# Patient Record
Sex: Female | Born: 1935 | Race: Black or African American | Hispanic: No | Smoking: Never smoker
Health system: Southern US, Community
[De-identification: ages and names within clinical notes are randomized; demographics above are authoritative.]

## PROBLEM LIST (undated history)

## (undated) DIAGNOSIS — R0989 Other specified symptoms and signs involving the circulatory and respiratory systems: Secondary | ICD-10-CM

## (undated) DIAGNOSIS — D649 Anemia, unspecified: Secondary | ICD-10-CM

## (undated) DIAGNOSIS — E8881 Metabolic syndrome: Secondary | ICD-10-CM

## (undated) DIAGNOSIS — I10 Essential (primary) hypertension: Secondary | ICD-10-CM

## (undated) DIAGNOSIS — T7840XA Allergy, unspecified, initial encounter: Secondary | ICD-10-CM

## (undated) DIAGNOSIS — E785 Hyperlipidemia, unspecified: Secondary | ICD-10-CM

## (undated) HISTORY — DX: Hyperlipidemia, unspecified: E78.5

## (undated) HISTORY — DX: Allergy, unspecified, initial encounter: T78.40XA

## (undated) HISTORY — DX: Metabolic syndrome: E88.81

## (undated) HISTORY — PX: TOTAL HIP ARTHROPLASTY: SHX124

## (undated) HISTORY — DX: Metabolic syndrome: E88.810

## (undated) HISTORY — DX: Essential (primary) hypertension: I10

## (undated) HISTORY — DX: Anemia, unspecified: D64.9

## (undated) HISTORY — DX: Other specified symptoms and signs involving the circulatory and respiratory systems: R09.89

## (undated) HISTORY — PX: CATARACT EXTRACTION, BILATERAL: SHX1313

---

## 1981-07-15 HISTORY — PX: TOTAL ABDOMINAL HYSTERECTOMY W/ BILATERAL SALPINGOOPHORECTOMY: SHX83

## 1999-01-05 ENCOUNTER — Encounter: Payer: Self-pay | Admitting: Internal Medicine

## 1999-01-05 ENCOUNTER — Ambulatory Visit (HOSPITAL_COMMUNITY): Admission: RE | Admit: 1999-01-05 | Discharge: 1999-01-05 | Payer: Self-pay | Admitting: Internal Medicine

## 2000-01-30 ENCOUNTER — Encounter: Payer: Self-pay | Admitting: Internal Medicine

## 2000-01-30 ENCOUNTER — Ambulatory Visit (HOSPITAL_COMMUNITY): Admission: RE | Admit: 2000-01-30 | Discharge: 2000-01-30 | Payer: Self-pay | Admitting: Internal Medicine

## 2000-12-29 ENCOUNTER — Ambulatory Visit (HOSPITAL_COMMUNITY): Admission: RE | Admit: 2000-12-29 | Discharge: 2000-12-29 | Payer: Self-pay | Admitting: Internal Medicine

## 2001-03-18 ENCOUNTER — Encounter: Payer: Self-pay | Admitting: Internal Medicine

## 2001-03-18 ENCOUNTER — Ambulatory Visit (HOSPITAL_COMMUNITY): Admission: RE | Admit: 2001-03-18 | Discharge: 2001-03-18 | Payer: Self-pay | Admitting: Internal Medicine

## 2001-11-13 ENCOUNTER — Encounter: Payer: Self-pay | Admitting: Internal Medicine

## 2001-11-13 ENCOUNTER — Inpatient Hospital Stay (HOSPITAL_COMMUNITY): Admission: EM | Admit: 2001-11-13 | Discharge: 2001-11-15 | Payer: Self-pay | Admitting: Emergency Medicine

## 2001-11-13 ENCOUNTER — Encounter: Payer: Self-pay | Admitting: Emergency Medicine

## 2002-06-07 ENCOUNTER — Encounter: Payer: Self-pay | Admitting: Internal Medicine

## 2002-06-07 ENCOUNTER — Ambulatory Visit (HOSPITAL_COMMUNITY): Admission: RE | Admit: 2002-06-07 | Discharge: 2002-06-07 | Payer: Self-pay | Admitting: Internal Medicine

## 2003-03-14 ENCOUNTER — Emergency Department (HOSPITAL_COMMUNITY): Admission: EM | Admit: 2003-03-14 | Discharge: 2003-03-15 | Payer: Self-pay | Admitting: Emergency Medicine

## 2003-03-14 ENCOUNTER — Encounter: Payer: Self-pay | Admitting: Emergency Medicine

## 2003-10-19 ENCOUNTER — Ambulatory Visit (HOSPITAL_COMMUNITY): Admission: RE | Admit: 2003-10-19 | Discharge: 2003-10-19 | Payer: Self-pay | Admitting: Internal Medicine

## 2004-05-24 ENCOUNTER — Ambulatory Visit: Payer: Self-pay | Admitting: Internal Medicine

## 2004-06-04 ENCOUNTER — Ambulatory Visit: Payer: Self-pay | Admitting: Internal Medicine

## 2004-07-15 HISTORY — PX: CARDIAC CATHETERIZATION: SHX172

## 2004-08-01 ENCOUNTER — Ambulatory Visit: Payer: Self-pay | Admitting: Internal Medicine

## 2004-08-13 ENCOUNTER — Encounter: Admission: RE | Admit: 2004-08-13 | Discharge: 2004-08-13 | Payer: Self-pay | Admitting: Internal Medicine

## 2004-10-15 ENCOUNTER — Ambulatory Visit: Payer: Self-pay | Admitting: Internal Medicine

## 2004-11-16 ENCOUNTER — Ambulatory Visit: Payer: Self-pay | Admitting: Internal Medicine

## 2005-02-04 ENCOUNTER — Ambulatory Visit: Payer: Self-pay | Admitting: Internal Medicine

## 2005-02-07 ENCOUNTER — Ambulatory Visit (HOSPITAL_COMMUNITY): Admission: RE | Admit: 2005-02-07 | Discharge: 2005-02-07 | Payer: Self-pay | Admitting: Internal Medicine

## 2005-02-13 ENCOUNTER — Ambulatory Visit: Payer: Self-pay | Admitting: Internal Medicine

## 2005-03-20 ENCOUNTER — Ambulatory Visit: Payer: Self-pay | Admitting: Internal Medicine

## 2005-04-25 ENCOUNTER — Inpatient Hospital Stay (HOSPITAL_COMMUNITY): Admission: RE | Admit: 2005-04-25 | Discharge: 2005-05-03 | Payer: Self-pay | Admitting: Orthopedic Surgery

## 2005-04-25 ENCOUNTER — Ambulatory Visit: Payer: Self-pay | Admitting: Physical Medicine & Rehabilitation

## 2005-04-27 ENCOUNTER — Ambulatory Visit: Payer: Self-pay | Admitting: Internal Medicine

## 2005-05-01 ENCOUNTER — Ambulatory Visit: Payer: Self-pay | Admitting: Gastroenterology

## 2005-05-02 ENCOUNTER — Encounter: Payer: Self-pay | Admitting: Cardiology

## 2005-05-03 ENCOUNTER — Inpatient Hospital Stay (HOSPITAL_COMMUNITY)
Admission: RE | Admit: 2005-05-03 | Discharge: 2005-05-10 | Payer: Self-pay | Admitting: Physical Medicine & Rehabilitation

## 2005-05-14 ENCOUNTER — Ambulatory Visit: Payer: Self-pay | Admitting: Internal Medicine

## 2005-05-27 ENCOUNTER — Ambulatory Visit: Payer: Self-pay | Admitting: Internal Medicine

## 2005-09-16 ENCOUNTER — Ambulatory Visit: Payer: Self-pay | Admitting: Internal Medicine

## 2005-09-17 ENCOUNTER — Ambulatory Visit: Payer: Self-pay

## 2005-09-18 ENCOUNTER — Ambulatory Visit: Payer: Self-pay | Admitting: Internal Medicine

## 2005-09-18 ENCOUNTER — Ambulatory Visit: Payer: Self-pay | Admitting: *Deleted

## 2005-09-18 ENCOUNTER — Inpatient Hospital Stay (HOSPITAL_COMMUNITY): Admission: RE | Admit: 2005-09-18 | Discharge: 2005-09-24 | Payer: Self-pay | Admitting: Orthopedic Surgery

## 2005-09-18 ENCOUNTER — Ambulatory Visit: Payer: Self-pay | Admitting: Physical Medicine & Rehabilitation

## 2005-09-20 ENCOUNTER — Encounter: Payer: Self-pay | Admitting: Cardiology

## 2005-09-24 ENCOUNTER — Inpatient Hospital Stay
Admission: RE | Admit: 2005-09-24 | Discharge: 2005-10-03 | Payer: Self-pay | Admitting: Physical Medicine & Rehabilitation

## 2006-03-03 ENCOUNTER — Ambulatory Visit (HOSPITAL_COMMUNITY): Admission: RE | Admit: 2006-03-03 | Discharge: 2006-03-03 | Payer: Self-pay | Admitting: Internal Medicine

## 2006-03-04 ENCOUNTER — Ambulatory Visit: Payer: Self-pay | Admitting: Internal Medicine

## 2006-04-03 ENCOUNTER — Ambulatory Visit: Payer: Self-pay | Admitting: Internal Medicine

## 2006-04-29 ENCOUNTER — Ambulatory Visit: Payer: Self-pay | Admitting: Internal Medicine

## 2006-05-02 ENCOUNTER — Ambulatory Visit: Payer: Self-pay | Admitting: Cardiology

## 2006-05-02 ENCOUNTER — Ambulatory Visit: Payer: Self-pay | Admitting: Gastroenterology

## 2006-06-02 ENCOUNTER — Ambulatory Visit: Payer: Self-pay | Admitting: Gastroenterology

## 2006-06-02 HISTORY — PX: COLONOSCOPY: SHX174

## 2006-06-27 ENCOUNTER — Ambulatory Visit: Payer: Self-pay | Admitting: Internal Medicine

## 2006-06-27 LAB — CONVERTED CEMR LAB
ALT: 13 units/L (ref 0–40)
AST: 16 units/L (ref 0–37)
Chol/HDL Ratio, serum: 3.6
Cholesterol: 127 mg/dL (ref 0–200)
HDL: 34.9 mg/dL — ABNORMAL LOW (ref >39.0)
LDL Cholesterol: 83 mg/dL (ref 0–99)
Total CK: 76 units/L (ref 7–177)
Triglyceride fasting, serum: 47 mg/dL (ref 0–149)
VLDL: 9 mg/dL (ref 0–40)

## 2007-02-23 ENCOUNTER — Telehealth (INDEPENDENT_AMBULATORY_CARE_PROVIDER_SITE_OTHER): Payer: Self-pay | Admitting: *Deleted

## 2007-03-03 ENCOUNTER — Encounter (INDEPENDENT_AMBULATORY_CARE_PROVIDER_SITE_OTHER): Payer: Self-pay | Admitting: *Deleted

## 2007-03-03 ENCOUNTER — Ambulatory Visit: Payer: Self-pay | Admitting: Internal Medicine

## 2007-03-03 DIAGNOSIS — IMO0001 Reserved for inherently not codable concepts without codable children: Secondary | ICD-10-CM

## 2007-03-03 DIAGNOSIS — E8881 Metabolic syndrome: Secondary | ICD-10-CM

## 2007-03-03 LAB — CONVERTED CEMR LAB
Bilirubin Urine: NEGATIVE
Blood in Urine, dipstick: NEGATIVE
Cholesterol, target level: 200 mg/dL
Glucose, Urine, Semiquant: NEGATIVE
HDL goal, serum: 40 mg/dL
Ketones, urine, test strip: NEGATIVE
LDL Goal: 130 mg/dL
Nitrite: NEGATIVE
Protein, U semiquant: NEGATIVE
Specific Gravity, Urine: 1.01
Urobilinogen, UA: NEGATIVE
WBC Urine, dipstick: NEGATIVE
pH: 6.5

## 2007-03-05 ENCOUNTER — Ambulatory Visit (HOSPITAL_COMMUNITY): Admission: RE | Admit: 2007-03-05 | Discharge: 2007-03-05 | Payer: Self-pay | Admitting: Internal Medicine

## 2007-03-08 LAB — CONVERTED CEMR LAB
ALT: 14 units/L (ref 0–35)
AST: 15 units/L (ref 0–37)
BUN: 17 mg/dL (ref 6–23)
Cholesterol: 154 mg/dL (ref 0–200)
Creatinine, Ser: 1.1 mg/dL (ref 0.4–1.2)
HDL: 30.7 mg/dL — ABNORMAL LOW (ref >39.0)
Hgb A1c MFr Bld: 5.2 % (ref 4.6–6.0)
LDL Cholesterol: 111 mg/dL — ABNORMAL HIGH (ref 0–99)
Potassium: 4.2 meq/L (ref 3.5–5.1)
TSH: 2.15 microintl units/mL (ref 0.35–5.50)
Total CHOL/HDL Ratio: 5
Total CK: 89 units/L (ref 7–177)
Triglycerides: 61 mg/dL (ref 0–149)
VLDL: 12 mg/dL (ref 0–40)

## 2007-03-09 ENCOUNTER — Encounter (INDEPENDENT_AMBULATORY_CARE_PROVIDER_SITE_OTHER): Payer: Self-pay | Admitting: *Deleted

## 2007-05-06 ENCOUNTER — Ambulatory Visit: Payer: Self-pay | Admitting: Internal Medicine

## 2007-05-27 ENCOUNTER — Ambulatory Visit: Payer: Self-pay | Admitting: Internal Medicine

## 2007-05-27 DIAGNOSIS — J309 Allergic rhinitis, unspecified: Secondary | ICD-10-CM | POA: Insufficient documentation

## 2007-05-27 DIAGNOSIS — D649 Anemia, unspecified: Secondary | ICD-10-CM

## 2007-05-27 DIAGNOSIS — R0989 Other specified symptoms and signs involving the circulatory and respiratory systems: Secondary | ICD-10-CM

## 2007-05-27 DIAGNOSIS — J45909 Unspecified asthma, uncomplicated: Secondary | ICD-10-CM

## 2007-05-29 ENCOUNTER — Ambulatory Visit: Payer: Self-pay | Admitting: Internal Medicine

## 2007-05-29 DIAGNOSIS — M722 Plantar fascial fibromatosis: Secondary | ICD-10-CM

## 2007-05-29 DIAGNOSIS — R252 Cramp and spasm: Secondary | ICD-10-CM

## 2007-05-31 LAB — CONVERTED CEMR LAB
BUN: 19 mg/dL (ref 6–23)
Creatinine, Ser: 1.05 mg/dL (ref 0.40–1.20)
Potassium: 4.2 meq/L (ref 3.5–5.3)

## 2007-06-01 ENCOUNTER — Encounter (INDEPENDENT_AMBULATORY_CARE_PROVIDER_SITE_OTHER): Payer: Self-pay | Admitting: *Deleted

## 2007-06-23 ENCOUNTER — Ambulatory Visit: Payer: Self-pay | Admitting: Internal Medicine

## 2007-06-23 DIAGNOSIS — E785 Hyperlipidemia, unspecified: Secondary | ICD-10-CM | POA: Insufficient documentation

## 2007-06-23 DIAGNOSIS — I10 Essential (primary) hypertension: Secondary | ICD-10-CM

## 2007-06-23 DIAGNOSIS — K1370 Unspecified lesions of oral mucosa: Secondary | ICD-10-CM | POA: Insufficient documentation

## 2007-09-08 ENCOUNTER — Encounter: Payer: Self-pay | Admitting: Internal Medicine

## 2007-09-09 ENCOUNTER — Ambulatory Visit: Payer: Self-pay | Admitting: Internal Medicine

## 2007-09-12 LAB — CONVERTED CEMR LAB
BUN: 17 mg/dL (ref 6–23)
Creatinine, Ser: 1.2 mg/dL (ref 0.4–1.2)
Potassium: 4.3 meq/L (ref 3.5–5.1)
Total CK: 71 units/L (ref 7–177)

## 2007-09-15 ENCOUNTER — Encounter (INDEPENDENT_AMBULATORY_CARE_PROVIDER_SITE_OTHER): Payer: Self-pay | Admitting: *Deleted

## 2007-11-20 ENCOUNTER — Ambulatory Visit: Payer: Self-pay | Admitting: Internal Medicine

## 2007-11-20 ENCOUNTER — Encounter: Payer: Self-pay | Admitting: Internal Medicine

## 2007-11-20 DIAGNOSIS — M545 Low back pain: Secondary | ICD-10-CM

## 2007-11-20 LAB — CONVERTED CEMR LAB
Bilirubin Urine: NEGATIVE
Blood in Urine, dipstick: NEGATIVE
Glucose, Urine, Semiquant: NEGATIVE
Ketones, urine, test strip: NEGATIVE
Nitrite: NEGATIVE
Protein, U semiquant: NEGATIVE
Specific Gravity, Urine: 1.015
Urobilinogen, UA: NEGATIVE
WBC Urine, dipstick: NEGATIVE
pH: 6

## 2007-11-23 ENCOUNTER — Encounter (INDEPENDENT_AMBULATORY_CARE_PROVIDER_SITE_OTHER): Payer: Self-pay | Admitting: *Deleted

## 2007-12-02 ENCOUNTER — Encounter: Admission: RE | Admit: 2007-12-02 | Discharge: 2007-12-23 | Payer: Self-pay | Admitting: Internal Medicine

## 2007-12-02 ENCOUNTER — Encounter: Payer: Self-pay | Admitting: Internal Medicine

## 2007-12-15 ENCOUNTER — Ambulatory Visit: Payer: Self-pay | Admitting: Internal Medicine

## 2008-01-12 ENCOUNTER — Telehealth (INDEPENDENT_AMBULATORY_CARE_PROVIDER_SITE_OTHER): Payer: Self-pay | Admitting: *Deleted

## 2008-03-07 ENCOUNTER — Encounter: Payer: Self-pay | Admitting: Internal Medicine

## 2008-03-11 ENCOUNTER — Telehealth (INDEPENDENT_AMBULATORY_CARE_PROVIDER_SITE_OTHER): Payer: Self-pay | Admitting: *Deleted

## 2008-04-08 ENCOUNTER — Telehealth (INDEPENDENT_AMBULATORY_CARE_PROVIDER_SITE_OTHER): Payer: Self-pay | Admitting: *Deleted

## 2008-04-08 ENCOUNTER — Ambulatory Visit: Payer: Self-pay | Admitting: Internal Medicine

## 2008-04-08 LAB — CONVERTED CEMR LAB
BUN: 21 mg/dL (ref 6–23)
Creatinine, Ser: 1.2 mg/dL (ref 0.4–1.2)
Potassium: 4.2 meq/L (ref 3.5–5.1)

## 2008-04-11 ENCOUNTER — Ambulatory Visit: Payer: Self-pay | Admitting: Internal Medicine

## 2008-04-11 DIAGNOSIS — I498 Other specified cardiac arrhythmias: Secondary | ICD-10-CM

## 2008-04-14 ENCOUNTER — Ambulatory Visit (HOSPITAL_COMMUNITY): Admission: RE | Admit: 2008-04-14 | Discharge: 2008-04-14 | Payer: Self-pay | Admitting: Internal Medicine

## 2008-09-30 ENCOUNTER — Encounter: Payer: Self-pay | Admitting: Internal Medicine

## 2008-09-30 ENCOUNTER — Ambulatory Visit: Payer: Self-pay | Admitting: Internal Medicine

## 2008-10-03 ENCOUNTER — Encounter (INDEPENDENT_AMBULATORY_CARE_PROVIDER_SITE_OTHER): Payer: Self-pay | Admitting: *Deleted

## 2008-10-03 LAB — CONVERTED CEMR LAB
BUN: 21 mg/dL (ref 6–23)
Creatinine, Ser: 1.1 mg/dL (ref 0.4–1.2)
Potassium: 4.3 meq/L (ref 3.5–5.1)

## 2008-10-13 ENCOUNTER — Encounter: Payer: Self-pay | Admitting: Internal Medicine

## 2008-10-13 ENCOUNTER — Telehealth (INDEPENDENT_AMBULATORY_CARE_PROVIDER_SITE_OTHER): Payer: Self-pay | Admitting: *Deleted

## 2008-11-10 ENCOUNTER — Telehealth: Payer: Self-pay | Admitting: Internal Medicine

## 2008-11-21 ENCOUNTER — Ambulatory Visit (HOSPITAL_BASED_OUTPATIENT_CLINIC_OR_DEPARTMENT_OTHER): Admission: RE | Admit: 2008-11-21 | Discharge: 2008-11-22 | Payer: Self-pay | Admitting: Orthopedic Surgery

## 2008-11-21 HISTORY — PX: SHOULDER SURGERY: SHX246

## 2009-01-02 ENCOUNTER — Ambulatory Visit: Payer: Self-pay | Admitting: Internal Medicine

## 2009-01-02 DIAGNOSIS — R5383 Other fatigue: Secondary | ICD-10-CM

## 2009-01-02 DIAGNOSIS — R5381 Other malaise: Secondary | ICD-10-CM

## 2009-01-05 LAB — CONVERTED CEMR LAB
BUN: 20 mg/dL (ref 6–23)
Basophils Absolute: 0 10*3/uL (ref 0.0–0.1)
Basophils Relative: 0.1 % (ref 0.0–3.0)
Creatinine, Ser: 1.1 mg/dL (ref 0.4–1.2)
Eosinophils Absolute: 0.2 10*3/uL (ref 0.0–0.7)
Eosinophils Relative: 2.1 % (ref 0.0–5.0)
HCT: 31.8 % — ABNORMAL LOW (ref 36.0–46.0)
Hemoglobin: 10.9 g/dL — ABNORMAL LOW (ref 12.0–15.0)
Hgb A1c MFr Bld: 5.4 % (ref 4.6–6.5)
Lymphocytes Relative: 16.6 % (ref 12.0–46.0)
Lymphs Abs: 1.4 10*3/uL (ref 0.7–4.0)
MCHC: 34.1 g/dL (ref 30.0–36.0)
MCV: 95.9 fL (ref 78.0–100.0)
Monocytes Absolute: 0.6 10*3/uL (ref 0.1–1.0)
Monocytes Relative: 6.9 % (ref 3.0–12.0)
Neutro Abs: 6.2 10*3/uL (ref 1.4–7.7)
Neutrophils Relative %: 74.3 % (ref 43.0–77.0)
Platelets: 279 10*3/uL (ref 150.0–400.0)
Potassium: 4.2 meq/L (ref 3.5–5.1)
RBC: 3.32 M/uL — ABNORMAL LOW (ref 3.87–5.11)
RDW: 11.1 % — ABNORMAL LOW (ref 11.5–14.6)
TSH: 0.89 microintl units/mL (ref 0.35–5.50)
WBC: 8.4 10*3/uL (ref 4.5–10.5)

## 2009-01-06 ENCOUNTER — Encounter (INDEPENDENT_AMBULATORY_CARE_PROVIDER_SITE_OTHER): Payer: Self-pay | Admitting: *Deleted

## 2009-06-19 ENCOUNTER — Encounter (INDEPENDENT_AMBULATORY_CARE_PROVIDER_SITE_OTHER): Payer: Self-pay | Admitting: *Deleted

## 2009-08-23 ENCOUNTER — Ambulatory Visit (HOSPITAL_COMMUNITY): Admission: RE | Admit: 2009-08-23 | Discharge: 2009-08-23 | Payer: Self-pay | Admitting: Internal Medicine

## 2009-08-23 LAB — HM MAMMOGRAPHY: HM Mammogram: NEGATIVE

## 2009-08-25 ENCOUNTER — Ambulatory Visit: Payer: Self-pay | Admitting: Internal Medicine

## 2009-08-25 DIAGNOSIS — R9431 Abnormal electrocardiogram [ECG] [EKG]: Secondary | ICD-10-CM | POA: Insufficient documentation

## 2009-08-28 LAB — CONVERTED CEMR LAB
ALT: 16 units/L (ref 0–35)
AST: 16 units/L (ref 0–37)
Albumin: 3.8 g/dL (ref 3.5–5.2)
Alkaline Phosphatase: 99 units/L (ref 39–117)
BUN: 13 mg/dL (ref 6–23)
Basophils Absolute: 0 10*3/uL (ref 0.0–0.1)
Basophils Relative: 0.2 % (ref 0.0–3.0)
Bilirubin, Direct: 0 mg/dL (ref 0.0–0.3)
CO2: 33 meq/L — ABNORMAL HIGH (ref 19–32)
Calcium: 9.5 mg/dL (ref 8.4–10.5)
Chloride: 105 meq/L (ref 96–112)
Cholesterol: 147 mg/dL (ref 0–200)
Creatinine, Ser: 1 mg/dL (ref 0.4–1.2)
Eosinophils Absolute: 0.2 10*3/uL (ref 0.0–0.7)
Eosinophils Relative: 2.2 % (ref 0.0–5.0)
GFR calc non Af Amer: 69.88 mL/min (ref >60)
Glucose, Bld: 112 mg/dL — ABNORMAL HIGH (ref 70–99)
HCT: 33.7 % — ABNORMAL LOW (ref 36.0–46.0)
HDL: 34.5 mg/dL — ABNORMAL LOW (ref >39.00)
Hemoglobin: 10.9 g/dL — ABNORMAL LOW (ref 12.0–15.0)
Hgb A1c MFr Bld: 5.7 % (ref 4.6–6.5)
LDL Cholesterol: 94 mg/dL (ref 0–99)
Lymphocytes Relative: 18.2 % (ref 12.0–46.0)
Lymphs Abs: 1.4 10*3/uL (ref 0.7–4.0)
MCHC: 32.3 g/dL (ref 30.0–36.0)
MCV: 99.4 fL (ref 78.0–100.0)
Monocytes Absolute: 0.6 10*3/uL (ref 0.1–1.0)
Monocytes Relative: 7.4 % (ref 3.0–12.0)
Neutro Abs: 5.5 10*3/uL (ref 1.4–7.7)
Neutrophils Relative %: 72 % (ref 43.0–77.0)
Platelets: 328 10*3/uL (ref 150.0–400.0)
Potassium: 4.4 meq/L (ref 3.5–5.1)
RBC: 3.39 M/uL — ABNORMAL LOW (ref 3.87–5.11)
RDW: 11.8 % (ref 11.5–14.6)
Sodium: 141 meq/L (ref 135–145)
TSH: 1.06 microintl units/mL (ref 0.35–5.50)
Total Bilirubin: 0.8 mg/dL (ref 0.3–1.2)
Total CHOL/HDL Ratio: 4
Total Protein: 7.6 g/dL (ref 6.0–8.3)
Triglycerides: 94 mg/dL (ref 0.0–149.0)
VLDL: 18.8 mg/dL (ref 0.0–40.0)
WBC: 7.7 10*3/uL (ref 4.5–10.5)

## 2009-09-14 ENCOUNTER — Ambulatory Visit: Payer: Self-pay | Admitting: Internal Medicine

## 2009-09-14 LAB — CONVERTED CEMR LAB
OCCULT 1: NEGATIVE
OCCULT 2: NEGATIVE
OCCULT 3: NEGATIVE

## 2010-08-04 ENCOUNTER — Encounter: Payer: Self-pay | Admitting: Internal Medicine

## 2010-08-14 NOTE — Assessment & Plan Note (Signed)
Summary: cpx/ns/kdc   Vital Signs:  Patient profile:   76 year old female Height:      65.75 inches Weight:      249.2 pounds BMI:     40.67 Temp:     98.1 degrees F oral Pulse rate:   60 / minute Resp:     16 per minute BP sitting:   118 / 86  (left arm) Cuff size:   large  Vitals Entered By: Shonna Chock (August 25, 2009 11:18 AM)  Comments REVIEWED MED LIST, PATIENT AGREED DOSE AND INSTRUCTION CORRECT    History of Present Illness: [name] is here for [facility] / Medicare physical; she  is having some back pain since 07/04/2009 ? after lifting heavy bags. Intermittent pulling sensation  with spinal  rotation. Rx: Tylenol with benefit  Preventive Screening-Counseling & Management  Caffeine-Diet-Exercise     Does Patient Exercise: yes  Allergies: 1)  ! * Guiatuss Ac 2)  Doxycycline 3)  Darvocet 4)  * Labetolol  Past History:  Past Medical History: Allergic rhinitis Asthma May 2003-bronchitis/ CHF Anemia-NOS Hyperlipidemia Hypertension PLANTAR FASCITIS  CAROTID BRUIT LEFT METABOLIC SYNDROME  Past Surgical History: Catheterization  negative  2006 Total hip replacement 2005; 2006; Shoulder surgery 11/21/2008 ,Dr Simonne Come Hysterectomy & BSO for infection & ovarian disease, Dr Randell Patient  Family History: Father: HTN, CVA Mother: HTN, arthritis Siblings: 2 sisters with HTN PGM: DM MGF: MI @ ? age MGM: stomach CA; no FH thyroid disease  Social History: Retired Never Smoked Alcohol use-no Regular exercise-yes:walks 30 min once daily  Does Patient Exercise:  yes  Review of Systems       The patient complains of dyspnea on exertion.  The patient denies anorexia, fever, weight loss, weight gain, decreased hearing, hoarseness, chest pain, syncope, peripheral edema, prolonged cough, headaches, hemoptysis, abdominal pain, melena, hematochezia, severe indigestion/heartburn, suspicious skin lesions, depression, unusual weight change, abnormal bleeding, enlarged lymph  nodes, and angioedema.         Cataracts ; Dr Hazle Quant has recommended surgery. Nerve damage RLE post surgery GU:  Denies discharge, dysuria, hematuria, and incontinence. MS:  Complains of low back pain; denies joint redness and joint swelling; Occa hand pain, esp L thumb with driving.  Physical Exam  General:  in no acute distress; alert,appropriate and cooperative throughout examination Head:  Normocephalic and atraumatic without obvious abnormalities.  Eyes:  No corneal or conjunctival inflammation noted. EOMI. Perrla. Funduscopic exam benign, without hemorrhages, exudates or papilledema. cataract OS Ears:  External ear exam shows no significant lesions or deformities.  Otoscopic examination reveals clear canals, tympanic membranes are intact bilaterally without bulging, retraction, inflammation or discharge. Hearing is grossly normal bilaterally. Nose:  External nasal examination shows no deformity or inflammation. Nasal mucosa are pink and moist without lesions or exudates. Mouth:  Oral mucosa and oropharynx without lesions or exudates.  Upper plate & lower partial; dental work indicated Breasts:  Mammograms 08/23/2009 Lungs:  Normal respiratory effort, chest expands symmetrically. Lungs are clear to auscultation, no crackles or wheezes. Heart:  regular rhythm, no gallop, no rub, no JVD, no HJR, bradycardia, and grade 1 /6 systolic murmur.   Abdomen:  Bowel sounds positive,abdomen soft and non-tender without masses, organomegaly or hernias noted. Genitalia:  S/P TAH & BSO Msk:  Increased kyphotic  curve Pulses:  R and L carotid,radial,dorsalis pedis and posterior tibial pulses are full and equal bilaterally Extremities:  No clubbing, cyanosis, edema, or deformity noted with normal full range of motion of all joints.  Decreased elevation of LE   Neurologic:  alert & oriented X3, strength normal in all extremities, and DTRs symmetrical and normal.   Skin:  Intact without suspicious lesions or  rashes Cervical Nodes:  No lymphadenopathy noted Axillary Nodes:  No palpable lymphadenopathy Psych:  memory intact for recent and remote, normally interactive, and good eye contact.     Impression & Recommendations:  Problem # 1:  PREVENTIVE HEALTH CARE (ICD-V70.0)  Orders: EKG w/ Interpretation (93000) Venipuncture (84132) TLB-Lipid Panel (80061-LIPID) TLB-BMP (Basic Metabolic Panel-BMET) (80048-METABOL) TLB-CBC Platelet - w/Differential (85025-CBCD) TLB-Hepatic/Liver Function Pnl (80076-HEPATIC) TLB-TSH (Thyroid Stimulating Hormone) (84443-TSH) TLB-A1C / Hgb A1C (Glycohemoglobin) (83036-A1C)  Problem # 2:  LOW BACK PAIN, CHRONIC (ICD-724.2)  Her updated medication list for this problem includes:    Tramadol Hcl 50 Mg Tabs (Tramadol hcl) .[name]... 1 q 6-8 hrs  as needed back pain  Problem # 3:  HYPERTENSION (ICD-401.9) Controlled Her updated medication list for this problem includes:    Furosemide 40 Mg Tabs (Furosemide) .[name]... 1 by mouth qd    Spironolactone 25 Mg Tabs (Spironolactone) .[name]... 1 by mouth qd    Avapro 300 Mg Tabs (Irbesartan) .[name]... 1/2 tab qd  Orders: EKG w/ Interpretation (93000) Venipuncture (44010) Prescription Created Electronically [phone])  Problem # 4:  HYPERLIPIDEMIA (ICD-272.4)  Her updated medication list for this problem includes:    Pravachol 20 Mg Tabs (Pravastatin sodium) .[name]... 1 by mouth qhs  Orders: TLB-Lipid Panel (80061-LIPID) Prescription Created Electronically [phone])  Problem # 5:  METABOLIC SYNDROME X (ICD-277.7)  Orders: Venipuncture (34742) TLB-A1C / Hgb A1C (Glycohemoglobin) (83036-A1C) Prescription Created Electronically [phone])  Problem # 6:  NONSPECIFIC ABNORMAL ELECTROCARDIOGRAM (ICD-794.31) Asymptomatic except for DOE; Ezma is deconditioned.Chages are NS ST-T with some progression suggested.Negative Catheterization , PMH of. Cataract surgery not contraindicated  Complete Medication List: 1)  Furosemide 40 Mg Tabs  (Furosemide) .[name].. 1 by mouth qd 2)  Spironolactone 25 Mg Tabs (Spironolactone) .[name].. 1 by mouth qd 3)  Albuterol 90 Mcg/act Aers (Albuterol) .... Prn 4)  Lorazepam 1 Mg Tabs (Lorazepam) .... Take 1/2 pill  q 8 hrs as needed only 5)  Pravachol 20 Mg Tabs (Pravastatin sodium) .[name].. 1 by mouth qhs 6)  Avapro 300 Mg Tabs (Irbesartan) .... 1/2 tab qd 7)  Basa I Qd  8)  Zantac 150mg  Prn  .... Qhs 9)  Glucosamine  10)  Fish Oil  11)  Multivitamin  12)  Gaviscon Prn  13)  Robitussin Dm  14)  Tylenol Prn  15)  Excedren Prn  16)  Cod Liver Oil  17)  Tramadol Hcl 50 Mg Tabs (Tramadol hcl) .[name].. 1 q 6-8 hrs  as needed back pain 18)  Sertraline Hcl 50 Mg Tabs (Sertraline hcl) .... 1/2 once daily x 6 days then 1 once daily (do not take with tramadol)  Patient Instructions: 1)  Call if you wish Physical Therapy referral for your back Prescriptions: AVAPRO 300 MG  TABS (IRBESARTAN) 1/2 tab qd  #30 x 5   Entered and Authorized by:   Marga Melnick MD   Signed by:   Marga Melnick MD on 08/25/2009   Method used:   Faxed to ...       [facility] [address] (retail)       [address]       [place], [place]  [id number]       Ph: [phone]       Fax: [phone]   RxID:   [id number]  PRAVACHOL 20 MG TABS (PRAVASTATIN SODIUM) 1 by mouth qhs  #90 Tablet x 3   Entered and Authorized by:   Marga Melnick MD   Signed by:   Marga Melnick MD on 08/25/2009   Method used:   Faxed to ...       [facility] [address] (retail)       [address]       [place], [place]  [address]       Ph: [phone]       Fax: [phone]   RxID:   [id number] FUROSEMIDE 40 MG TABS (FUROSEMIDE) 1 by mouth qd  #90 Tablet x 3   Entered and Authorized by:   Marga Melnick MD   Signed by:   Marga Melnick MD on 08/25/2009   Method used:   Faxed to ...       [facility] (retail)       [address]       [place], [place]  [address]       Ph:  [phone]       Fax: [phone]   RxID:   [id number] SPIRONOLACTONE 25 MG TABS (SPIRONOLACTONE) 1 by mouth qd  #90 Tablet x 3   Entered and Authorized by:   Marga Melnick MD   Signed by:   Marga Melnick MD on 08/25/2009   Method used:   Faxed to ...       [facility] (retail)       [address]       [place], [place]  [id number]       Ph: [phone]       Fax: [phone]   RxID:   [id number] ALBUTEROL 90 MCG/ACT AERS (ALBUTEROL) prn  #1 x 2   Entered and Authorized by:   Marga Melnick MD   Signed by:   Marga Melnick MD on 08/25/2009   Method used:   Faxed to ...       [facility] [address] [address] (retail)       [address]       [place], [place]  [id number]       Ph: [phone]       Fax: [phone]   RxID:   [id number]

## 2010-09-03 ENCOUNTER — Other Ambulatory Visit: Payer: Self-pay | Admitting: Internal Medicine

## 2010-09-03 DIAGNOSIS — Z1231 Encounter for screening mammogram for malignant neoplasm of breast: Secondary | ICD-10-CM

## 2010-09-25 ENCOUNTER — Telehealth (INDEPENDENT_AMBULATORY_CARE_PROVIDER_SITE_OTHER): Payer: Self-pay | Admitting: *Deleted

## 2010-10-02 NOTE — Progress Notes (Signed)
Summary: 4 refills  Phone Note Refill Request Message from:  Patient on September 25, 2010 4:55 PM  Refills Requested: Medication #1:  SPIRONOLACTONE 25 MG TABS 1 by mouth qd  Medication #2:  LORAZEPAM 1 MG TABS TAKE 1/2 pill  q 8 hrs as needed only  Medication #3:  AVAPRO 300 MG  TABS 1/2 tab qd  Medication #4:  FUROSEMIDE 40 MG TABS 1 by mouth qd [employer], corner of [address],     Next Appointment Scheduled: 5/14 = cpx    [place] Initial call taken by: Jerolyn Shin,  September 25, 2010 4:57 PM    Prescriptions: AVAPRO 300 MG  TABS (IRBESARTAN) 1/2 tab qd  #30 x 1   Entered by:   Shonna Chock CMA   Authorized by:   Marga Melnick MD   Signed by:   Shonna Chock CMA on 09/26/2010   Method used:   Printed then faxed to ...       [facility] (retail)       [address]       [place], [place]  [id number]       Ph: [phone]       Fax: [phone]   RxID:   [id number] LORAZEPAM 1 MG TABS (LORAZEPAM) TAKE 1/2 pill  q 8 hrs as needed only  #30 x 0   Entered by:   Shonna Chock CMA   Authorized by:   Marga Melnick MD   Signed by:   Shonna Chock CMA on 09/26/2010   Method used:   Printed then faxed to ...       [facility] [facility] [address] (retail)       [address]       [place], [place]  [id number]       Ph: [phone]       Fax: [phone]   RxID:   [id number] SPIRONOLACTONE 25 MG TABS (SPIRONOLACTONE) 1 by mouth qd  #90 Tablet x 0   Entered by:   Shonna Chock CMA   Authorized by:   Marga Melnick MD   Signed by:   Shonna Chock CMA on 09/26/2010   Method used:   Printed then faxed to ...       [facility] [facility] (retail)       [address]       [place], [place]  [id number]       Ph: [phone]       Fax: [phone]   RxID:   [id number] FUROSEMIDE 40 MG TABS (FUROSEMIDE) 1 by mouth qd  #90 Tablet x 0   Entered by:   Shonna Chock CMA   Authorized by:   Marga Melnick MD   Signed by:   Shonna Chock CMA on 09/26/2010   Method used:   Printed then faxed to ...       [facility] (retail)       [address]       [place], [place]  [id number]       Ph: [phone]       Fax: [phone]   RxID:   [id number]

## 2010-10-02 NOTE — Progress Notes (Signed)
Summary: Pravachol refill  Phone Note Refill Request Message from:  Patient on September 25, 2010 4:58 PM  Refills Requested: Medication #1:  PRAVACHOL 20 MG TABS 1 by mouth qhs [facility], [address]  Next Appointment Scheduled: 5/14 = CP[facility]    [place] Initial call taken by: Jerolyn Shin,  September 25, 2010 4:59 PM    Prescriptions: PRAVACHOL 20 MG TABS (PRAVASTATIN SODIUM) 1 by mouth qhs  #90 Tablet x 0   Entered by:   Shonna Chock CMA   Authorized by:   Marga Melnick MD   Signed by:   Shonna Chock CMA on 09/26/2010   Method used:   Printed then faxed to ...       [facility] (retail)       [address]       [place], [place]  [id number]       Ph: [phone]       Fax: [phone]   RxID:   [id number]

## 2010-10-15 ENCOUNTER — Telehealth: Payer: Self-pay | Admitting: *Deleted

## 2010-10-15 MED ORDER — LORAZEPAM 1 MG PO TABS: ORAL_TABLET | ORAL | Status: DC

## 2010-10-15 NOTE — Telephone Encounter (Signed)
Refill faxed to pharmacy.

## 2010-10-23 LAB — POCT I-STAT 4, (NA,K, GLUC, HGB,HCT)
Glucose, Bld: 110 mg/dL — ABNORMAL HIGH (ref 70–99)
HCT: 40 % (ref 36.0–46.0)
Hemoglobin: 13.6 g/dL (ref 12.0–15.0)
Potassium: 4 mEq/L (ref 3.5–5.1)

## 2010-10-24 ENCOUNTER — Ambulatory Visit (HOSPITAL_COMMUNITY)
Admission: RE | Admit: 2010-10-24 | Discharge: 2010-10-24 | Disposition: A | Discharge status: Home / Self Care | Payer: Medicare Other | Source: Ambulatory Visit | Attending: Internal Medicine | Admitting: Internal Medicine

## 2010-10-24 DIAGNOSIS — Z1231 Encounter for screening mammogram for malignant neoplasm of breast: Secondary | ICD-10-CM | POA: Insufficient documentation

## 2010-10-25 ENCOUNTER — Other Ambulatory Visit: Payer: Self-pay | Admitting: Internal Medicine

## 2010-11-24 ENCOUNTER — Encounter: Payer: Self-pay | Admitting: Internal Medicine

## 2010-11-26 ENCOUNTER — Ambulatory Visit (INDEPENDENT_AMBULATORY_CARE_PROVIDER_SITE_OTHER): Payer: Medicare Other | Admitting: Internal Medicine

## 2010-11-26 ENCOUNTER — Encounter: Payer: Self-pay | Admitting: Internal Medicine

## 2010-11-26 VITALS — BP 128/76 | HR 67 | Temp 98.3°F | Resp 14 | Ht 65.5 in | Wt 252.2 lb

## 2010-11-26 DIAGNOSIS — J45909 Unspecified asthma, uncomplicated: Secondary | ICD-10-CM

## 2010-11-26 DIAGNOSIS — E8881 Metabolic syndrome: Secondary | ICD-10-CM

## 2010-11-26 DIAGNOSIS — E785 Hyperlipidemia, unspecified: Secondary | ICD-10-CM

## 2010-11-26 DIAGNOSIS — R0609 Other forms of dyspnea: Secondary | ICD-10-CM

## 2010-11-26 DIAGNOSIS — D649 Anemia, unspecified: Secondary | ICD-10-CM

## 2010-11-26 DIAGNOSIS — I10 Essential (primary) hypertension: Secondary | ICD-10-CM

## 2010-11-26 DIAGNOSIS — Z23 Encounter for immunization: Secondary | ICD-10-CM

## 2010-11-26 DIAGNOSIS — Z0000 Encounter for general adult medical examination without abnormal findings: Secondary | ICD-10-CM

## 2010-11-26 MED ORDER — LOSARTAN POTASSIUM 100 MG PO TABS: 100.0000 mg | ORAL_TABLET | Freq: Every day | ORAL | Status: DC

## 2010-11-26 MED ORDER — TETANUS-DIPHTH-ACELL PERTUSSIS 5-2.5-18.5 LF-MCG/0.5 IM SUSP
0.5000 mL | Freq: Once | INTRAMUSCULAR | Status: AC
  Administered 2010-11-26: 0.5 mL via INTRAMUSCULAR

## 2010-11-26 NOTE — Patient Instructions (Signed)
Preventive Health Care: Exercise  30-45  minutes a day, 3-4 days a week. Walking is especially valuable in preventing Osteoporosis. Eat a low-fat diet with lots of fruits and vegetables, up to 7-9 servings per day. Avoid obesity; your goal = waist less than 35 inches.Consume less than 30 grams of sugar per day from foods & drinks with High Fructose Corn Syrup as #2,3 or #4 on label. Living Will place you in charge of your health care  decisions. Verify these are  in place. Please schedule fasting labs @ [facility]: BMET, Lipids, hepatic panel, TSH, CBC & dif, A1c ( 401.9, 277.7, 285.9,786.09)

## 2010-11-26 NOTE — Progress Notes (Signed)
Subjective:    Patient ID: [name], female    DOB: [birth year], 76 y.o.   MRN: [id number]  HPI Medicare Wellness Visit:  The following psychosocial & medical history were reviewed as required by Medicare.   Social history: caffeine: 3-4 cups/ week & occasional colas , alcohol:  no ,  tobacco use : never  & exercise : no.   Home & personal  safety / fall risk: no issues, activities of daily living: no limitations , seatbelt use :  yes , and smoke alarm employment : yes .  Power of Attorney/ Living Will status :  Not sure as to Living Will  Vision ( as recorded per Nurse) & Hearing  evaluation :  Whisper heard @6  ft. Orientation :oriented X 3 , memory & recall : good ,  math testing: good,and mood & affect :  normal . Depression / anxiety: denied Travel history : 25 [place] , immunization status :Pneumovax due , transfusion history: ?, and preventive health surveillance ( colonoscopies, BMD , etc as per protocol/ [facility]):  Colonoscopy due 2017, Dental care:   Every 6-12 months . Chart reviewed &  Updated. Active issues reviewed & addressed.       Review of Systems  She states she is asymptomatic except for  DOE  Which is stable. She does not exercise as noted.                                                                                                                             Cough:no Sputum production:no Hemoptysis:no Dyspnea (rest/exertional/PND): DOE only , after walking  post 50 ft Wheezing:no Chest pain, edema, palpitations:no Treatment/efficacy: rest Use of rescue inhaler: albuterol < 2 X /month Use of maintenance inhaler:no Smoking:never Past medical history: Asthma/ Allergies/ emphysema:asthma only Family history pulmonary disease:no      Objective:   Physical Exam General appearance is one of good health and nourishment w/o distress. Weight excess.  Eyes: No conjunctival inflammation or scleral icterus is present. Proptosis suggested  Oral exam: Upper plate & lower  partial; lips and gums are healthy appearing.There is no oropharyngeal erythema or exudate noted.   Heart:  Normal rate and regular rhythm. S1 and S2 normal without gallop, murmur, click, rub or other extra sounds  . S4   Lungs:Chest clear to auscultation; no wheezes, rhonchi,rales ,or rubs present.No increased work of breathing. BS slightly decreased  Abdomen: bowel sounds normal, soft and non-tender without masses, organomegaly or hernias noted.  No guarding or rebound   Skin:Warm & dry.  Intact without suspicious lesions or rashes ; no jaundice or tenting  Lymphatic: No lymphadenopathy is noted about the head, neck, axilla  areas.   Musculoskeletal/Extremities: Range of motion; stability; muscle strength; and  muscle tone are normal. Nail health is good. There are  no significant deformities of the digits. No cyanosis  or edema are present. Marked crepitus of L > R knee. Slight clubbing of fingernails  suggested.                                        Assessment & Plan:  #1 Medicare Wellness Exam; criteria met & data entered.                                                                          #2 DOE ; ? Emphysematous component superimposed on Asthma ,which appears essentially quiescent/ Major contribution is weight excess & deconditioning.                                                                Plan: trial of Spiriva. See all other Orders & Recommendations.

## 2010-11-27 ENCOUNTER — Encounter: Payer: Self-pay | Admitting: Internal Medicine

## 2010-11-27 MED ORDER — TIOTROPIUM BROMIDE MONOHYDRATE 18 MCG IN CAPS: 18.0000 ug | ORAL_CAPSULE | Freq: Every day | RESPIRATORY_TRACT | Status: DC

## 2010-11-27 NOTE — Op Note (Signed)
[name], [name]:  [id number]   MEDICAL RECORD NO.:  [id number]          PATIENT TYPE:  AMB   LOCATION:  [facility]                         FACILITY:  [facility]   PHYSICIAN:  Marlowe Kays, M.D.  DATE OF BIRTH:  [birth year]   DATE OF PROCEDURE:  11/21/2008  DATE OF DISCHARGE:                               OPERATIVE REPORT   PREOPERATIVE DIAGNOSES:  1. Degenerative arthritis, glenohumeral joint with partial rotator      cuff tear.  2. Chronic impingement syndrome due to hypertrophic arthritis of the      acromioclavicular joint and a downsloping acromion.   POSTOPERATIVE DIAGNOSES:  1. Degenerative arthritis, glenohumeral joint with partial rotator      cuff tear.  2. Chronic impingement syndrome due to hypertrophic arthritis of the      acromioclavicular joint and a downsloping acromion.   OPERATION:  1. Left shoulder arthroscopy with debridement of joint lining,      subscapularis and the underneath surface of the rotator cuff.  2. Arthroscopic subacromial decompression.  3. Open distal clavicle resection.   SURGEON:  Marlowe Kays, M.D.   ASSISTANT:  Mr. Idolina Primer, [facility].   ANESTHESIA:  General.   PATHOLOGY/INDICATION FOR PROCEDURE:  Painful left shoulder with MRI  demonstrating the preoperative diagnoses.   PROCEDURE:  Prophylactic antibiotics.  She has bilateral total hip  replacements.  Satisfactory general anesthesia.  Beach-chair position on  the sliding frame.  Left shoulder girdle was prepped with the DuraPrep  and draped in sterile field.  Latex precautions.  Time-out performed.  Anatomy of the shoulder joint was marked out and proposed incision for  the distal clavicle resection, as well as posterior and lateral portal  sites and the subacromial space were likewise injected with the Marcaine  adrenaline.  Through a posterior soft spot portal, I atraumatically  entered the glenohumeral joint with the findings of a good bit of  synovitis  present, some disruption of subscapularis, considerable  fraying of the underneath surface of the rotator cuff.  The biceps  tendon was a little hemorrhagic, but intact.  Her humeral head showed  some chondrolysis.  After assessing the situation, I advanced the scope  between the biceps tendon and the subscapularis and, using a switching  stick, made an anterior portal and over the switching stick I placed a  metal cannula followed by a 4.2 shaver in the joint.  I then debrided  down the lining of the anterior joint with the synovitis, the  subscapularis tendon and the underneath surface of the rotator cuff.  No  labral or biceps tendon debridement was required.  I then redirected the  scope into the subacromial space and through a lateral portal introduced  a 4.2 shaver.  She had an extensive amount of bursitis which I resected  and in the process also shaved down the rotator cuff.  I then used the  90-degree ArthroCare vaporizer to remove soft tissue from the underneath  surface of the distal acromion and followed this with a 4-mm oval bur,  burring down the same.  I  then went back-and-forth between these 3  instruments until we had a wide subacromial decompression with a clean  interval.  No rotator cuff tear was identified, other than the fraying.  We then removed all fluid from the joint and made an incision over the  distal clavicle until we located the [facility] joint.  With subperiosteal  dissection, I isolated a segment 1-1/2 cm from the joint and placed a  scribe line there, followed by using micro saw to amputate the clavicle  which I removed with towel clip and cutting-cautery technique.  I  removed some small bony spicules from the distal clavicle and irrigated  the wound well, after placing bone wax on the cut surface.  I then  placed Gelfoam in the interval and closed the wound with interrupted #1  Vicryl in the fascial periosteal complex, 2-0 Vicryl in the subcutaneous  tissue,  Steri-Strips on the skin for the incision and 4-0 nylon for the  3 portals.  Betadine and Adaptic were placed over the portals, dry  sterile dressing  over all the wounds, followed by a shoulder  immobilizer.  She tolerated the procedure well and at the time of this  dictation was on her way to the recovery room in satisfactory condition  with no known complications.           ______________________________  Marlowe Kays, M.D.     JA/MEDQ  D:  11/21/2008  T:  11/21/2008  Job:  [id number]

## 2010-11-27 NOTE — Assessment & Plan Note (Signed)
NMR 2005: LDL 161(0960/454), HDL 27, TG 43. LDL goal = < 120.

## 2010-11-28 ENCOUNTER — Other Ambulatory Visit (INDEPENDENT_AMBULATORY_CARE_PROVIDER_SITE_OTHER): Payer: Medicare Other

## 2010-11-28 DIAGNOSIS — E119 Type 2 diabetes mellitus without complications: Secondary | ICD-10-CM

## 2010-11-28 DIAGNOSIS — I10 Essential (primary) hypertension: Secondary | ICD-10-CM

## 2010-11-28 DIAGNOSIS — E8881 Metabolic syndrome: Secondary | ICD-10-CM

## 2010-11-28 DIAGNOSIS — Z8639 Personal history of other endocrine, nutritional and metabolic disease: Secondary | ICD-10-CM

## 2010-11-28 DIAGNOSIS — R0609 Other forms of dyspnea: Secondary | ICD-10-CM

## 2010-11-28 DIAGNOSIS — D649 Anemia, unspecified: Secondary | ICD-10-CM

## 2010-11-28 DIAGNOSIS — R0989 Other specified symptoms and signs involving the circulatory and respiratory systems: Secondary | ICD-10-CM

## 2010-11-28 DIAGNOSIS — Z862 Personal history of diseases of the blood and blood-forming organs and certain disorders involving the immune mechanism: Secondary | ICD-10-CM

## 2010-11-28 LAB — BASIC METABOLIC PANEL
BUN: 21 mg/dL (ref 6–23)
Calcium: 9.2 mg/dL (ref 8.4–10.5)
Creatinine, Ser: 1.2 mg/dL (ref 0.4–1.2)
GFR: 58.68 mL/min — ABNORMAL LOW (ref >60.00)
Glucose, Bld: 117 mg/dL — ABNORMAL HIGH (ref 70–99)

## 2010-11-28 LAB — HEMOGLOBIN A1C: Hgb A1c MFr Bld: 5.9 % (ref 4.6–6.5)

## 2010-11-28 LAB — HEPATIC FUNCTION PANEL
ALT: 11 U/L (ref 0–35)
AST: 14 U/L (ref 0–37)
Alkaline Phosphatase: 95 U/L (ref 39–117)
Bilirubin, Direct: 0.1 mg/dL (ref 0.0–0.3)
Total Bilirubin: 0.8 mg/dL (ref 0.3–1.2)

## 2010-11-28 LAB — CBC WITH DIFFERENTIAL/PLATELET
Eosinophils Relative: 2.4 % (ref 0.0–5.0)
HCT: 33.1 % — ABNORMAL LOW (ref 36.0–46.0)
Lymphs Abs: 1.4 10*3/uL (ref 0.7–4.0)
MCV: 96.3 fl (ref 78.0–100.0)
Monocytes Absolute: 0.5 10*3/uL (ref 0.1–1.0)
Platelets: 289 10*3/uL (ref 150.0–400.0)
RDW: 12.1 % (ref 11.5–14.6)
WBC: 8.7 10*3/uL (ref 4.5–10.5)

## 2010-11-28 LAB — B12 AND FOLATE PANEL: Folate: >24.8 ng/mL (ref >5.9)

## 2010-11-30 NOTE — Op Note (Signed)
NAMEKELLYANNE, [name]:  [id number]   MEDICAL RECORD NO.:  [id number]          PATIENT TYPE:  INP   LOCATION:  1503                         FACILITY:  [facility]   PHYSICIAN:  Marlowe Kays, M.D.  DATE OF BIRTH:  11/14/1934   DATE OF PROCEDURE:  09/18/2005  DATE OF DISCHARGE:                                 OPERATIVE REPORT   PREOP DIAGNOSIS:  Osteoarthritis, right hip.   POSTOP DIAGNOSIS:  Osteoarthritis, right hip.   OPERATION:  Osteonics total hip replacement, right.   SURGEON:  Marlowe Kays, M.D.   ASSISTANT:  Georges Lynch. Darrelyn Hillock, M.D.   ANESTHESIA:  General.   DESCRIPTION OF PROCEDURE:  She has had a successful total hip replacement on  the left.  She has a fairly pronounced protrusio with severe pain in the  right hip. Even though she is age 76 her bone stock was quite good; and I  went with noncemented femoral and acetabular components.   DESCRIPTION OF PROCEDURE:  Prophylactic antibiotics satisfactory general  anesthesia, Foley catheter was inserted in the left lateral decubitus  position on the Mark II frame, right hip was prepped with DuraPrep and  draped in a sterile field, Ioban employed. Posterolateral incision down to  the fascia lata. __________ band was cut. External rotators were detached  from the femur, and the hip capsule exposed. I had removed a small portion  of the acetabulum, hoping to uncover the femoral head slightly. Because of  the significant protrusio, the head was almost completely nonvisible.   I then made my initial femoral neck cut at the base of the femoral head,  leaving plenty of room for revision of the neck cut. This allowed me to get  access to the piriformis fossa which I cleared of soft tissue. I then placed  a guide pin down in it, over drilled it,, and followed it with the canal  finder which was I was unable to advance. This is exactly what happened on  the left hip, so we were prepared and I had the flexible  reamers available;  and I was able to put the ball-tipped guidewire down without any difficulty  in the femoral canal; and then we reamed up initially to an 11.5 planning on  perhaps a size 7 which is what we used in the left hip.  We then began the  cylindrical reaming process, and in the process used the greater  trochanteric reamer; and also made my final neck cut a fingerbreadth above  the lesser trochanter. I then began rasping the canal and it became apparent  that a 7 size was just a little small; and consequently we went back to the  flexible reamers, reaming up to 12.5 which would be the tip for the size 8  prosthesis. We then went back to the cylindrical reamers, followed by the  rasp up to a #8 which fit nicely. I then returned it to the acetabulum where  I was able to remove the residual femoral head; and complete the  synovectomy. I then began the deepening and expanding  reaming process in the  left hip. She had a size 50 cup, but in this hip a size 52 appeared to be  more appropriate.   After going through a trial reduction with good stability, I then packed the  acetabulum with cancellus bone which we had harvested during the case and  from the femoral head.  I then placed the final cut with another trial  reduction with the scribe line set at 10 o'clock, hip was nice and stable.  Based on this, we then placed 2 superior acetabular screws which we  individually measured and screwed, and we placed the final liner of 10-  degrees angle at 10 o'clock. We then went back to the femur where the final  127 degrees size 8 prosthesis was placed.  We went through another trial  reduction and a +0 neck was the appropriate length and the +0 C-tapered  head, 36 mm, was then placed.  The hip reduced and found to be extremely  stable. Hip was not too tight, we had wanted to regain some length. We then  irrigated the wound well and closed with interrupted #1 Vicryl in __________  band, and  in the fascia lata and deep subcutaneous tissue, 2-0 Vicryl  superficial subcutaneous tissue, and staples in the skin. Betadine and  Adaptic dry sterile dressings were applied. She was gently placed on her  back, in an abduction pillow and taken to recovery room in satisfactory  condition with known complications. Estimated blood loss was 2000 mL. She  was given 2 units of blood replacement,           ______________________________  Marlowe Kays, M.D.     JA/MEDQ  D:  09/18/2005  T:  09/19/2005  Job:  [id number]

## 2010-11-30 NOTE — H&P (Signed)
[name], [name]:  [id number]   MEDICAL RECORD NO.:  [id number]          PATIENT TYPE:  INP   LOCATION:  NA                           FACILITY:  [facility]   PHYSICIAN:  Marlowe Kays, M.D.  DATE OF BIRTH:  1935-05-17   DATE OF ADMISSION:  09/18/2005  DATE OF DISCHARGE:                                HISTORY & PHYSICAL   CHIEF COMPLAINT:  Pain in my right hip.   HISTORY OF PRESENT ILLNESS:  This 76 year old lady who is nearly 6 months  post left total hip replacement arthroplasty has done so well with that hip.  She highly desires to have the right hip done. X-rays have shown advanced  osteoarthritis with near bone on bone deformity. After much discussion  including the risks and benefits of surgery, it is felt she would benefit  with surgical intervention and being admitted for a total hip replacement  arthroplasty. She will be cleared preoperatively by her family physician,  Dr. Marga [name].   PAST MEDICAL HISTORY:  She has only had 2 surgeries, one a hysterectomy and  the left total hip replacement. That was done back in October. Dr. [name] is  treating her for anxiety, asthma, hypertension, and reflux. She has no  medical allergies.   CURRENT MEDICATIONS:  1.  Avapro 300 mg tabs 1/2 daily.  2.  Spironolactone 25 mg tab 1 daily.  3.  Sertraline 50 mg tab 1 daily.  4.  Zantac 150 mg b.i.d.  5.  Lorazepam 1 mg tab at bedtime.  6.  Propoxyphene p.r.n. pain.   FAMILY HISTORY:  Positive for a brother who died at 77 from a reaction to  anesthesia. She has two sisters with hypertension. Her father had a history  of a stroke and mother with arthritis.   SOCIAL HISTORY:  She is widowed, retired, no intake of alcohol or tobacco  products. She is self sufficient, lives in her own home.   REVIEW OF SYSTEMS:  CNS:  No seizures, __________, paralysis, numbness or  double vision. RESPIRATORY:  No productive cough, no hemoptysis, no  shortness of breath.  CARDIOVASCULAR:  No chest pain, no angina, no  orthopnea. GASTROINTESTINAL:  No nausea, vomiting, melena, or bloody stools.  GENITOURINARY:  No discharge, dysuria or hematuria.  MUSCULOSKELETAL:  Primarily in present illness.   PHYSICAL EXAMINATION:  GENERAL:  Alert, cooperative, friendly, 76 year old  black female whose vital signs are:  VITAL SIGNS:  Blood pressure 126/68, pulse 60, respiratory rate 12.  HEENT:  Normocephalic. PERRLA. EOM intact. Oropharynx is clear.  CHEST:  Clear to auscultation. No rhonchi, no rales, no wheezes.  HEART:  Regular rate and rhythm. No murmurs are identified.  ABDOMEN:  Soft, obese, nontender. Liver and spleen not felt.  GENITALIA/RECTAL/PELVIC/BREASTS:  Not done not pertinent to present illness.  EXTREMITIES:  She has good circulation in both lower extremities. Pain with  internal and external rotation of the hip as well as flexion in the groin  area.   ADMISSION DIAGNOSES:  1.  Osteoarthritis of the right hip.  2.  Hypertension.  3.  Asthma.  4.  Anxiety.   PLAN:  The patient will be admitted to [facility] on September 18, 2005  to undergo right total hip replacement arthroplasty. Should we have any  medical problems, we will certainly contact Dr. [name] or one of his  associates to follow along with [place] during this patient's hospitalization.      [name].    ______________________________  Marlowe Kays, M.D.    DLU/MEDQ  D:  09/16/2005  T:  09/16/2005  Job:  [id number]   cc:   [name], M.D. [facility]  [phone] [place]  [place] [address]

## 2010-11-30 NOTE — Cardiovascular Report (Signed)
NAMEEVE, REY NO.:  [id number]   MEDICAL RECORD NO.:  [id number]          PATIENT TYPE:  INP   LOCATION:  2025                         FACILITY:  [facility]   PHYSICIAN:  Vida Roller, M.D.   DATE OF BIRTH:  1935/03/20   DATE OF PROCEDURE:  DATE OF DISCHARGE:                              CARDIAC CATHETERIZATION   PRIMARY CARE PHYSICIAN:  Titus Dubin. Alwyn Ren, M.D.   CARDIOLOGIST:  Willa Rough, M.D.   HISTORY OF PRESENT ILLNESS:  Ms. Brucker is a 76 year old African-American  woman who presented with abnormal cardiac enzymes and chest discomfort  concerning for an acute myocardial infarction.  She was evaluated after  having a left hip replacement April 25, 2005, under the care of Dr.  Simonne Come.  The concern was for a non-Q-wave myocardial infarction.  She was  referred for a heart catheterization today.   PROCEDURES PERFORMED:  1.  Left heart catheterization.  2.  Selective coronary angiography.  3.  Left ventriculography.   DETAILS OF THE PROCEDURE:  After obtaining informed consent, the patient was  brought to the cardiac catheterization laboratory in the fasting state.  There she was prepped and draped in the usual sterile manner and a local  anesthetic was obtained over the right groin using 1% lidocaine without  epinephrine.  The right femoral artery was cannulated using a modified  Seldinger technique with a 6 [place], 10 cm sheath and left heart  catheterization was performed using a 6 French Judkins left #4, a 6 French  Judkins right #4, a 6 [place] nontorquing right catheter, and a 6 French  pigtail catheter.  The pigtail catheter was used for left ventriculography  in the 30 degree RAO view.  At the conclusion of the procedure the catheters  were removed and the patient was moved back to the cardiology holding area.  The femoral artery sheath was removed.  Hemostasis was obtained using direct  manual pressure.  At the conclusion of the hold there  was no evidence of  ecchymosis or hematoma formation.  Distal pulses were intact.  Total  fluoroscopic time was 5.9 minutes.  Total iodinized contrast 75 mL.   RESULTS:  1.  Aortic pressure 157/78 with a mean of 113 mmHg.  2.  Left ventricular pressure 116/17 with an end-diastolic pressure of 28      mmHg.   SELECTIVE CORONARY ANGIOGRAPHY:  1.  The left main coronary artery is a moderate-caliber vessel which is      angiographically normal.  2.  The left circumflex coronary artery is a moderate-caliber vessel,      moderately tortuous, especially in its branch vessels, and is      angiographically unremarkable.  3.  The right coronary artery is a large-caliber, dominant vessel with a      small posterior descending coronary artery and has nonobstructive      coronary disease.   Left ventriculogram reveals preserved LV systolic function, estimated  ejection fraction 65%.  No obvious wall motion abnormalities and no mitral  regurgitation.   PLAN:  Medical management for  cardiac risk factors, including her  hypertension.  Further cardiology workup is likely not needed.  The patient  will recover as appropriate for her hip surgery.      Vida Roller, M.D.  Electronically Signed     JH/MEDQ  D:  05/02/2005  T:  05/03/2005  Job:  [id number]   cc:   Titus Dubin. Alwyn Ren, M.D. [facility]  [phone] [place]  [place] [address]

## 2010-11-30 NOTE — Discharge Summary (Signed)
[facility]  Patient:    [name], [name] Visit Number: [id number] MRN: [id number]          Service Type: MED Location: 3W [phone] [id number] Attending Physician:  Dolores Patty Dictated by:   Rosalyn Gess. Norins, M.D. [facility] Admit Date:  11/13/2001 Discharge Date: 11/15/2001   CC:         [name], M.D. [facility]  [place], [facility]  Nuclear medicine lab, [facility] at [facility]   Discharge Summary  ADMITTING DIAGNOSES: 1. Upper respiratory infection/bronchitis. 2. New onset congestive heart failure. 3. Hypertension. 4. Glucose intolerance, obesity equal to metabolic syndrome. 5. Anemia.  DISCHARGE DIAGNOSES: 1. Upper respiratory infection, stable and able to complete outpatient oral    medication. 2. Congestive heart failure, stable, with patient to have outpatient    follow-up. 3. Hypertension, variable. 4. Metabolic syndrome. 5. Iron deficiency anemia.  CONSULTANTS:  None.  PROCEDURES: 1. Admitting chest x-ray October 14, 2001, revealed cardiomegaly with mild    congestive heart failure. 2. Sinus x-ray Nov 13, 2001, which revealed normal aerated perinasal sinuses. 3. Transthoracic echocardiogram performed Nov 13, 2001, which revealed an    overall normal left ventricular systolic function with an ejection fraction    of 55 to 65%.  No diagnostic evidence of left ventricular wall motion    abnormality was noted.  There was mild mitral valvular regurgitation.  HISTORY OF PRESENT ILLNESS:  Patient is a 76 year old black female who presented with shortness of breath and probable congestive heart failure.  Patient is followed by Dr. [name] as an outpatient.  She had been on labetalol and Diovan with excellent control, but she was intolerant of the labetalol with possible arthralgias and myalgias.  Patient is being converted off of the labetalol and Diovan to ______ 2/240.  The day prior to admission, she began to have head congestion with  chills and sweats, developed shortness of breath prompting ER evaluation.  She also had otalgia and bilateral frontal headache with pressure.  In the emergency department, she was thought to have mild congestive heart failure and was started on diuretics.  Patient had not started taking ______ at that time but had decreased her labetalol to 150 a day and Lasix to 40 mg daily.  Patient was subsequently admitted for further treatment.  Past medical history, family history, and social history are documented in the admission history and physical.  PHYSICAL EXAMINATION:  VITAL SIGNS:  Admission exam significant for a blood pressure of 172/77.  O2 saturation was 95%.  HEENT:  Exam was unremarkable.  CHEST:  Revealed breath sounds to be decreased.  CARDIOVASCULAR:  Distant S4 with no other abnormalities noted.  Chest x-ray as noted.  HOSPITAL COURSE: #1 - CONGESTIVE HEART FAILURE:  Patient was treated with diuretics.  She had a total of 2.2 L diuresis.  Cardiac enzymes were negative.  Thyroid function was normal.  A 2-D echocardiogram revealed normal LV function as noted.  With the patient having no respiratory distress, she was felt to be stable and ready for discharge.  Plan:  Patient to be discharged home on an angiotensin-converting enzyme inhibitor, Lasix, and Aldactone.  Patient will need to have outpatient Persantine Cardiolite to complete evaluation of her cardiac status with new onset congestive heart failure.  She will be notified as to appointment time.  #2 - HYPERTENSION:  Patient to continue on present medical regimen to be further modified by Dr. [name] as an outpatient.  #3 - INFECTIOUS  DISEASE:  Patient on day #4 of antibiotics, day #1 of Ceftin. She has no evidence of active infection with her white count being down to 11.3 with a normal differential and no sinus drainage of purulent material and decreased facial pressure.  #4 - METABOLIC SYNDROME:  Patient with  obesity, glucose intolerance, who needs to have lifestyle modification to reduce her risk for progressive coronary artery disease.  5 - IRON DEFICIENCY ANEMIA:  Patients lab remains stable with a hemoglobin of 10.7.  Total serum iron was low at 21.  Iron percent saturation was low at 9%. B12 was normal at 1465, folate was normal.  Patient is tolerating her hemoglobin level well.  Plan:  Patient will need outpatient follow-up and would recommend that she be considered for full colonoscopy.  DISCHARGE EXAM:  VITAL SIGNS:  Temperature 97.6, blood pressure 156/73, pulse 68, respirations 18, weight 225.3.  GENERAL APPEARANCE:  This is an obese black female sitting in the bed in no acute distress.  Chest was clear with no rales on examination and good breath sounds throughout.  CARDIOVASCULAR:  2+ radial pulse.  Her precordium was quiet with a regular rate and rhythm.  EXTREMITIES:  No significant edema was noted in her lower extremities.  DISCHARGE MEDICATIONS: 1. Lasix 40 mg daily. 2. Aldactone 25 mg daily. 3. Altace 2.5 mg b.i.d. 4. Protonix 40 mg daily. 5. Ceftin 250 b.i.d. for five days. 6. OTC pseudoephedrine. 7. OTC ibuprofen.  CONDITION AT TIME OF DISCHARGE DICTATION:  Stable and improved. Dictated by:   Rosalyn Gess. Norins, M.D. [facility] Attending Physician:  Dolores Patty DD:  11/15/01 TD:  11/17/01 Job: [id number]

## 2010-11-30 NOTE — Discharge Summary (Signed)
[name], [name]:  [id number]   MEDICAL RECORD NO.:  [id number]          PATIENT TYPE:  INP   LOCATION:  2025                         FACILITY:  [facility]   PHYSICIAN:  Marlowe Kays, M.D.  DATE OF BIRTH:  [birth year]   DATE OF ADMISSION:  04/25/2005  DATE OF DISCHARGE:  05/03/2005                                 DISCHARGE SUMMARY   ADMISSION DIAGNOSIS:  1.  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  2.  Hypertension.  3.  Hiatal hernia.  4.  Asthma.  5.  Diabetes.   DISCHARGE DIAGNOSIS:  1.  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  2.  Hypertension.  3.  Hiatal hernia.  4.  Asthma.  5.  Nonobstructive coronary artery disease by cardiac catheterization.  6.  Diabetes.  7.  Postoperative anemia treated with transfusion.   CONSULTATIONS:  Titus Dubin. Alwyn Ren, M.D. [facility], internal medicine  [facility]   OPERATION:  On April 25, 2005, the patient underwent Osteonix total hip  replacement arthroplasty to the left hip with Dr. Ranee Gosselin assistant.   BRIEF HISTORY:  This 75 year old female seen by [place] continued to have  progressive problems concerning pain into her left hip.  She now finds her  left hip is markedly interfering with her daily activities.  She tried to  postpone this, but the pain is so severe now she really cannot get about.  She is now having to walk with a cane.  Marked involvement is seen on x-rays  of the left hip with near bone on bone deformity and destruction of the  joint, itself.  After much discussion including the risks and benefits of  surgery, and the patient notifying Dr. Alwyn Ren of the planned surgery, the  patient was admitted for the above procedure.   HOSPITAL COURSE:  The patient tolerated the surgical procedure quite well.  The wound remained dry without evidence of infection postoperatively.  Neurovascularly remained intact in the operative extremity.  She was begun  into the total  hip protocol.  She was also started on DVT prophylaxis  utilizing Coumadin.  Over the weekend, on postop day two, the patient began  having some discomfort with chest pain and abdominal pain.  Dr. Alwyn Ren was  notified and he saw the patient on internal medicine formal consult.  The  burning in the chest complaint by the patient prompted Dr. Alwyn Ren to order  certain labs which showed a mildly elevated troponin.  He also highly  suspected the GERD.  The CPK and MB was 5.3.  Cardiology consult was asked  for and received from [facility].  Cardiac catheterization was done  and showed nonobstructive coronary artery disease, normal LVEF in the face  of hypertension, they recommended medical management.  The patient's  postoperative blood loss was noted, GI consult was ordered and found that  she had no active bleeding and they felt that it was primarily an anemia  post left total hip replacement arthroplasty.  [facility] and Dr.  Alwyn Ren continued to follow the patient very  closely throughout her  hospitalization.  Since GI did not feel there was any GI blood loss, that  avenue was closed.  No further workup as far as cardiology was concerned and  it was decided that the patient would benefit with an in-hospital stay for  her retaining level of activity that she could enjoy the new hip that was  placed in the previously mentioned surgical procedure.  On the day of  transfer to [facility], the patient was awake,  alert, oriented, she felt better.  Hemoglobin was up to 10 and hematocrit  29.8.  The wound was dry, staples were intact.  Cardiac subsequently cleared  the patient for her transfer to subacute care unit.  She was placed back on  Coumadin for a four week DVT prophylaxis from the date of surgery.  We had  allowed 50% weight-bearing as tolerated to the operative extremity and she  was able to maintain that with physical therapy.  The laboratory  values that  are noted in this chart began on April 30, 2005.  This is several days  after her admission.  I do not have the preoperative labs, at all.  On  April 30, 2005, her CBC shows anemia 8.5, hematocrit 25.5.  RBC 2.75.  Initially, after transfusions, the patient's hemoglobin was 10.1 and  hematocrit 29.8 at the time of transfer to rehabilitation.  The occult blood  tests were all negative for GI bleed.  I have two routine chemistry levels  on October 17 and May 11, 2005, and blood chemistries were all perfectly  normal.  An electrocardiogram of April 29, 2005, with normal sinus rhythm,  normal ECG.  That is all of the laboratory values that I have concerning  this patient's admission from the dates above.   CONDITION ON DISCHARGE:  Improved and stable.   PLAN:  The patient is transferred to the subacute care unit to continue with  her rehabilitation post total hip replacement arthroplasty.  She is actually  looking forward to this step up in her postoperative course.  We will, of  course, rely on Dr. Alwyn Ren to see the patient as far as her medical  condition is concerned and we will also follow her as far as her orthopedic  status is concerned.  We will continue with touch down weight-bearing.  Staples are to be removed at 2 to 2 1/2 weeks after the date of surgery and  Steri-Strips applied.  I would like to continue on the Coumadin protocol as  mentioned above for four weeks after the date of surgery.  She can return to  see Dr. Simonne Come a week or so after her discharge from the subacute care  unit.      Dooley L. Cherlynn June.    ______________________________  Marlowe Kays, M.D.    DLU/MEDQ  D:  06/19/2005  T:  06/19/2005  Job:  [id number]   cc:   Titus Dubin. Alwyn Ren, M.D. [facility]  [phone] [place]  [place] [address]

## 2010-11-30 NOTE — Discharge Summary (Signed)
[name]:  [id number]   MEDICAL RECORD NO.:  [id number]          PATIENT TYPE:  INP   LOCATION:  1515                         FACILITY:  [facility]   PHYSICIAN:  Marlowe Kays, M.D.  DATE OF BIRTH:  [birth year]   DATE OF ADMISSION:  09/18/2005  DATE OF DISCHARGE:  09/24/2005                                 DISCHARGE SUMMARY   ADMISSION DIAGNOSES:  1.  Severe osteoarthritis of the right hip.  2.  Hypertension.  3.  Asthma.  4.  Anxiety.   DISCHARGE DIAGNOSES:  1.  Severe osteoarthritis of the right hip.  2.  Hypertension.  3.  Asthma.  4.  Anxiety.  5.  Atypical chest pain.  6.  Jaw pain, not cardiac, transient and resolved.  7.  Mild postoperative anemia treated with transfusion.   CONSULTATIONS:  Internal medicine and cardiology.   OPERATION:  On September 18, 2005, the patient underwent [employer] total hip  replacement arthroplasty of the right hip by Dr. Simonne Come, Dr. Ranee Gosselin assisted.   BRIEF HISTORY:  This 76 year old lady who had successful total hip  replacement arthroplasty to the left hip was doing well with that. She had  significant osteoarthritis in the right hip and after much discussion  including the fact that conservative care had not helped her and the fact  that she was doing so well with her new total hip, she was highly desirous  for a total hip replacement arthroplasty. The risks and benefits of surgery  were explained to the patient by Dr. Simonne Come.   HOSPITAL COURSE:  The patient tolerated the surgical procedure quite well.  Began complaining early on of some mild dysphagia and sore throat. She had  difficulty with some substernal type chest pain, sore throat, her pharyngeal  area as well as her jaw was uncomfortable as well. To be sure that she did  not have a cardiac event, we asked for and received a cardiac consult. It  was thought early on that since she was under transfusion for her  postoperative anemia  that it might have been a transfusion reaction however,  she did not have the progressive problems and has already received nearly 2  units of blood. Her primary complaint was sore throat. She was given  lozenges for that as well as spray and it eventually resolved. It was not  felt that she had a transfusion reaction from the consulting physicians.  Also she did not have a cardiac event. Once all of that was managed and she  was more comfortable, she was able to ambulate in the hall. She was doing  well as she could being familiar with total hip protocol. We only allowed  50% weightbearing for Press-Fit prosthesis. She had some mild serous  drainage from the wound but no bloody drainage. Neurovascular was intact to  the operative extremity. She was afebrile, stable. She would benefit with an  inpatient rehabilitation program as she did on her last hip prosthesis so  the arrangements were made for her to do that. Laboratory values in  the  hospital hematologically showed a preoperative CBC with a mild anemia.  Hemoglobin was 11.6, hematocrit was 35.2. When her hemoglobin dropped to 8.8  and hematocrit was 26.1, we considered transfusion. This eventually dropped  on down to 8 for hemoglobin. We transfused her and brought her hemoglobin up  to 10.5, hematocrit 30.3. Final hemoglobin was a 9.8 with hematocrit of  29.1. Blood chemistries remained essentially normal, she did have  hyponatremia preop but this was corrected. Final sodium was 140, potassium  is 3.4, glucose is 116. Her EG7 iSTAT showed a venous pH of 7.520 preop.  Hemoglobin HA1C was 5.0 which was normal. CK was 164, CK-MB was 1.9,  relative index was 1.2, troponin was 0.21 which was elevated. This was  repeated and eventually it came down to 0.06. Urinalysis negative for  urinary tract infection. No chest x-ray seen in the chart. Electrocardiogram  with normal sinus rhythm.   CONDITION ON DISCHARGE:  To PACU improved, stable.    PLAN:  The patient has continued 50% weightbearing to the operative  extremity, dry dressing p.r.n. Any medical concerns or problems please call  Dr. Alwyn Ren or one of his medical associates. We will be more than happy to  follow along with her during her rehabilitation program at [facility].      [name].    ______________________________  Marlowe Kays, M.D.    DLU/MEDQ  D:  09/24/2005  T:  09/25/2005  Job:  [id number]   cc:   Titus Dubin. Alwyn Ren, M.D. [facility]  [phone] [place]  [place] [address]

## 2010-11-30 NOTE — Consult Note (Signed)
NAMEKARIYAH, BAUGH NO.:  [id number]   MEDICAL RECORD NO.:  [id number]          PATIENT TYPE:  INP   LOCATION:  1503                         FACILITY:  [facility]   PHYSICIAN:  Vida Roller, M.D.   DATE OF BIRTH:  17-Aug-1934   DATE OF CONSULTATION:  09/19/2005  DATE OF DISCHARGE:                                   CONSULTATION   PRIMARY CARE PHYSICIAN:  Dr. Marga Melnick   HISTORY OF PRESENT ILLNESS:  Ms. Gasparyan is a 76 year old woman who is status  post right hip arthroplasty who developed an episode of discomfort in her  chin and neck after having her right hip manipulated after surgery. She  states that she was  incredibly uncomfortable, felt as though she was having  severe pain in her hip due to her recent surgery and manipulation of that  hip in bed. Had the onset of discomfort in her chin and neck which was  relatively short lived. Not associated with any radiation or shortness of  breath. She feels fine now. No chest pain, no shortness of breath, resting  comfortably in bed.   PAST MEDICAL HISTORY:  1.  In October of 2006 she underwent a heart catheterization which showed      non obstructive coronary disease, normal LV systolic function, and she      was treated medically at that point. She does have a history of      diastolic heart failure. She had a Cardiolite in the past which was      negative. She has hypertension, hyperlipidemia and a family history of      coronary disease.  2.  She has a little bit of a history of asthma as well as pneumonia.  3.  She has had a total abdominal hysterectomy, bilateral salpingo-      oophorectomy and a hiatal hernia repair in the past.   FAMILY HISTORY:  Hypertension. One brother who died from complications of  surgery after a CABG at age 45.   CURRENT MEDICATIONS:  1.  Avapro 150 mg once daily.  2.  Spironolactone 25 mg daily.  3.  Zantac 50 mg b.i.d.  4.  Citrulline 50 mg daily.  5.  Ativan 1 mg q.h.s.  6.  Multivitamin.   REVIEW OF SYSTEMS:  She denies any fever, chills, nausea, vomiting,  headache, sinus tenderness, chest discomfort, shortness of breath, PND,  orthopnea, urinary frequency, dysuria, weakness, numbness. She does have the  significant problem with osteoarthritis in her lower extremities. No  diarrhea or bright red blood per rectum, GERD symptoms. The remainder of her  review of systems is negative.   PHYSICAL EXAMINATION:  GENERAL:  She is an obese black female in no apparent  distress. Alert and oriented x4.  VITAL SIGNS:  Pulse is 74, respirations are 18, blood pressure is 113/59.  She is afebrile.  HEENT:  Unremarkable.  NECK:  Supple, there is no jugular venous distension or carotid bruits.  CHEST:  Clear to auscultation bilaterally.  CARDIOVASCULAR:  Regular with a 1/6 systolic murmur, first  and second heart  sounds are normal.  GU/BREAST/RECTAL EXAM:  Deferred.  ABDOMEN:  Soft, nontender.  LOWER EXTREMITIES: She is post-op, she has a wedge in place, trace edema.  Pulses are 1+.  NEUROLOGIC/MUSCULOSKELETAL EXAM:  Otherwise unremarkable.   She has had no chest x-ray but her electrocardiogram showed sinus rhythm,  nonspecific ST, T wave changes. Normal axes.   LABORATORY DATA:  White blood cell count 9.6 thousand, hemoglobin 8,  hematocrit 26 with a platelet count of 229,000. Sodium 137, potassium 4.5,  chloride 107, bicarb 26, BUN 18, creatinine 1.3, calculated creatinine  clearance is 46. Blood glucose of 151. INR is 1.3.   IMPRESSION:  So this is a lady with atypical discomfort in her chest which I  think is unlikely to be from angina. Certainly with a recent nonobstructive  catheterization with normal left ventricular systolic function, the risk of  myocardial infarction is relatively low. Certainly she does have  hypertension which is well controlled and she is status post the total hip  arthroplasty.   RECOMMENDATIONS:  I would recommend we cycle her  enzymes. She probably needs  and echocardiogram to assess her LV systolic function. There is some  question as to whether or not she has aortic stenosis on her heart  catheterization. I would add low-dose aspirin. Probably should check a  hemoglobin A1C given her elevated blood glucose and she probably needs to be  on telemetry. Will follow her with you. I do not anticipate any further  evaluation at this time.      Vida Roller, M.D.  Electronically Signed     JH/MEDQ  D:  09/19/2005  T:  09/19/2005  Job:  [id number]   cc:   Titus Dubin. Alwyn Ren, M.D. [facility]  [phone] [place]  [place] [address]

## 2010-11-30 NOTE — H&P (Signed)
NAMEGIANINA, [name]:  [id number]   MEDICAL RECORD NO.:  [id number]           PATIENT TYPE:   LOCATION:                                FACILITY:  [facility]   PHYSICIAN:  Marlowe Kays, M.D.  DATE OF BIRTH:  1935/06/17   DATE OF ADMISSION:  04/25/2005  DATE OF DISCHARGE:                                HISTORY & PHYSICAL   CHIEF COMPLAINT:  Pain in my left hip.   HISTORY OF PRESENT ILLNESS:  This 76 year old female has been seen by [place]  with continuing progressive problems concerning pain in her left hip. She  has tried to postpone any surgical intervention now for a considerable  period of time but she finds that the left hip is markedly interfering with  her day to day activities. She is really an active lady and finds that the  pain is so intense with certain maneuvers that she has discomfort which is  quite intolerable. She now has to walk with a cane.   X-rays have shown marked involvement of the left hip joint with near bone on  bone deformity and destruction secondary to osteoarthritis. After much  discussion including the risks and benefits of surgery, it was decided the  patient wound benefit from surgical intervention and is being admitted for a  total hip replacement arthroplasty to the left hip.   Dr. Marga Melnick is her family physician and is aware of this surgical  procedure.   We plan to have postoperative consultation from the rehabilitation  department at [facility] for her possible admission to subacute care  unit. After that, she will have home health with [place].   PAST MEDICAL HISTORY:  This lady has been in relatively good health. Dr.  Marga Melnick is her family physician. She has a history of asthma with the  last attack being in May of this year, pneumonia back in 2002. She also has  hypertension, hiatal hernia and of course osteoarthritis. She has no medical  allergies.   CURRENT MEDICATIONS:  1.  Spironolactone tabs 25 mg  daily.  2.  Furosemide tab 40 mg daily.  3.  Avapro tablet 300 mg, 1/2 daily.  4.  Lorazepam 1 mg tab, 1/2 at bedtime.  5.  Albuterol inhaler p.r.n.   FAMILY HISTORY:  Positive for heart disease in her brother who succumb to  complications of anesthesia in May of 1985. Mother had hypertension,  deceased at age 39, grandmother with cancer and father with a stroke  deceased at 63.   SOCIAL HISTORY:  The patient is widowed, retired, lives alone, she is an  Programmer, systems, has no intake of alcohol or tobacco products, has one child and  she was self care after the surgery.   REVIEW OF SYMPTOMS:  CNS:  No seizures, __________, paralysis, numbness or  double vision. RESPIRATORY:  No productive cough, no hemoptysis, no  shortness of breath. CARDIOVASCULAR:  No chest pain, no angina, no  orthopnea. GASTROINTESTINAL:  No nausea, vomiting, melena, or bloody stools.  GENITOURINARY:  No discharge, dysuria or hematuria.  MUSCULOSKELETAL:  Primarily in present illness.   PHYSICAL EXAMINATION:  GENERAL:  Alert, cooperative, friendly, somewhat  obese, 76 year old black female whose vital signs are:  VITAL SIGNS:  Blood pressure 144/80 left arm seated, respirations 16 and  unlabored, pulse 78 and regular.  HEENT:  Normocephalic, PERRLA. EOM intact. Oropharynx is clear.  CHEST:  Clear to auscultation. No rhonchi, no rales, no wheezes.  HEART:  Regular rate and rhythm. No murmurs are heard.  ABDOMEN:  Soft, nontender. Liver and spleen not felt.  GENITALIA/RECTAL/PELVIC/BREASTS:  Not done not pertinent to present illness.  EXTREMITIES:  The patient has painful range of motion of the left hip, most  specifically on internal and external rotation.   ADMISSION DIAGNOSES:  1.  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  2.  Hypertension.  3.  Hiatal hernia.  4.  Asthma.   PLAN:  The patient will undergo left total hip replacement arthroplasty. We  plan to have her hopefully go to [facility]  after her  regular hospitalization and  then home with home physical therapy.      Dooley L. Cherlynn June.    ______________________________  Marlowe Kays, M.D.    DLU/MEDQ  D:  04/08/2005  T:  04/08/2005  Job:  [id number]   cc:   Titus Dubin. Alwyn Ren, M.D. [facility]  [phone] [place]  [place] [address]

## 2010-11-30 NOTE — Consult Note (Signed)
[name], PENCE NO.:  [id number]   MEDICAL RECORD NO.:  [id number]          PATIENT TYPE:  INP   LOCATION:  1429                         [facility]ACILITY[facility]  [facility]   PHYSICIAN:  Duke Salvia, M.D.  DATE OF BIRTH:  1935-02-24   DATE OF CONSULTATION:  04/28/2005  DATE OF DISCHARGE:                                   CONSULTATION   Thank you very much for asking [place] to see Mrs. Shanena Pellegrino in consultation for  positive cardiac enzymes following hip replacement surgery.   Mrs. 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  On the  morning of October 14, she reported to her physician that she had an  epigastric burning and tightness in her chest the night before.  Enzymes  were checked. The troponin was 0.1, and then subsequently a repeat  assessment increased to 0.43.  MB fractions were borderline. CKs were  elevated (as anticipated, in the context of her hip surgery).   The patient had no recurrence of her chest tightness since the night of  October 13.   She has no antecedent history of the same.  She has, however, had problems  of late with exercise intolerance manifested by dyspnea.  She had  significant problems with her hip, but even with her hip limiting her  activity, her dyspnea has been a concomitant limitation. She does not have  nocturnal dyspnea or peripheral edema.   She does have a history of asthma and significant wheezing problems not  associated with respiratory illness.  She was admitted in 2003 with  shortness of breath, and this was attributed to diastolic heart failure.  Cardiac evaluation around that time included a Cardiolite that demonstrated  no ischemia and ejection fraction that was normal.   REVIEW OF SYSTEMS:  CARDIAC:  Notable for the absence of syncope, transient  neurological events.  She has had one episode of tachy palpitations  occurring  with some medication that was described by Dr. Simonne Come.   Her cardiac risk factors are notable for hypertension, dyslipidemia.  She  has an elevated blood sugar but no diagnosis of diabetes, and her family  history.   PAST MEDICAL HISTORY:  Otherwise notable for:  1.  GE reflux disease.  2.  Obesity.   PAST SURGICAL HISTORY:  Notable for TAH-SBO.   SOCIAL HISTORY:  As noted previously.  She denies alcohol or recreational  drugs.   MEDICATIONS AT TIME OF ADMISSION:  1.  Aldactone 25.  2.  Furosemide 40.  3.  Avapro 150.  4.  Lorazepam at bedtime.   CURRENT MEDICATIONS:  1.  Coumadin.  2.  Lovenox 30 q.12 h.  3.  Aldactone 25.  4.  Avapro 150.  5.  Magnesium oxide 400.  6.  Protonix 40.   ALLERGIES:  She is allergic to LABETALOL, though when I asked her, she said  she had no known drug allergies.   PHYSICAL EXAMINATION:  GENERAL:  She is a obese African-American  female in  no acute distress though some discomfort.  VITAL SIGNS:  Blood pressure 118/63, pulse 89, respirations 20.  HEENT:  No icterus or xanthomata.  NECK:  Neck veins were about 10 cm, little bit hard to tell.  Carotid were  brisk bilaterally without bruits.  BACK: Without kyphosis, scoliosis.  LUNGS: Clear.  CARDIAC:  Heart sounds were regular without murmurs or gallops.  ABDOMEN:  Soft with active bowel sounds.  EXTREMITIES:  Femoral pulses were not examined.  Distal pulses were intact.  There was no clubbing, cyanosis, or edema.  SKIN: The patient described blisters on her buttocks.  This was not  examined.  NEUROLOGIC:  Examination was grossly normal.   LABORATORY DATA:  Electrocardiogram dated October 15 demonstrated sinus  rhythm at 92 with intervals of 0.17/0.08/0.37.  There was low voltage in the  limb leads.  Nonspecific ST changes, otherwise normal.   Electrocardiogram dated October 14 demonstrated T wave inversions in lead  III that were transient and maybe a whiff of ST segment depression  V1 to V3.   IMPRESSION:  1.  Postoperative non-ST-elevation myocardial infarction with a peak      troponin of 0.43 associated with minor but concerning electrocardiogram      changes.  2.  Antecedent history of dyspnea on exertion.  3.  Cardiac risk factors notable for hypertension, family history,      questionable lipid status.  4.  Arthritis status post left total hip with anticipated right total hip.  5.  Asthma.  6.  Previous diagnosis of diastolic heart failure with a negative      Cardiolite, normal left ventricular function in 2004.  7.  Gastroesophageal reflux disease.  8.  Increasing blood sugar.  9.  Obesity.   Mrs. Moeckel has multiple cardiac risk factors.  I am concerned about the  positive enzyme leak in the setting of her postoperative increased demand  situation.  Electrocardiographic changes are also concerning in this regard.   I think catheterization is indicated, not least of which because she has  anticipated future surgeries.  This will also define anatomy, stratify risk,  and direct therapy.   For now, I would:  1.  Transfer the patient to [facility].  2.  Hold Coumadin.  3.  Increase beta blocker.  4.  Hold anticoagulation.  5.  Catheterization on Tuesday if her INR is okay.  6.  Continue aspirin.   Thank you for the consultation.           ______________________________  Duke Salvia, M.D.     SCK/MEDQ  D:  04/28/2005  T:  04/28/2005  Job:  [id number]   cc:   Titus Dubin. Alwyn Ren, M.D. [facility]  [phone] [place]  [place] [address]   Marlowe Kays, M.D.  Fax: [phone]

## 2010-11-30 NOTE — Discharge Summary (Signed)
[name], HASEMAN NO.:  [id number]   MEDICAL RECORD NO.:  [id number]          PATIENT TYPE:  ORB   LOCATION:  4522                         FACILITY:  [facility]   PHYSICIAN:  Ranelle Oyster, M.D.DATE OF BIRTH:  04/30/1935   DATE OF ADMISSION:  09/24/2005  DATE OF DISCHARGE:  10/03/2005                                 DISCHARGE SUMMARY   DISCHARGE DIAGNOSES:  1.  Right total hip replacement March 7 secondary to osteoarthritis.  2.  Pain management.  3.  Coumadin for deep vein thrombosis prophylaxis.  4.  Postoperative anemia.  5.  Hypertension.  6.  Anxiety.  7.  History of left total hip replacement October 2006.  8.  Asthma.   HISTORY OF PRESENT ILLNESS:  This is a 76 year old female, history of a left  total hip replacement in October 2006, now admitted to [facility]  September 18, 2005, with end-stage changes of the right hip and no relief with  conservative care.  She underwent a right total hip replacement March 7 per  Dr. Simonne Come.  Placed on Coumadin for deep vein thrombosis prophylaxis,  touchdown weightbearing.  Postoperative anemia, 8.8, transfused two units of  packed red blood cells with last CBC 9.8.  The patient was admitted to  subacute care services.   PAST MEDICAL HISTORY:  See discharge diagnoses.   No alcohol or tobacco.   ALLERGIES:  LABETALOL.   SOCIAL HISTORY:  Retired, lives alone.  Son in area works.  Local family  check as needed.  She lives in a one-level home with one to two steps to  entry.   Medications prior to admission were:  1.  Avapro 300 mg one-half tablet daily.  2.  Aldactone 25 mg daily.  3.  Zoloft 50 mg daily.  4.  Lorazepam 1 mg at bedtime.  5.  Aspirin 81 mg daily.  6.  Zantac daily.  7.  Albuterol inhaler as needed.   REHABILITATION HOSPITAL COURSE:  The patient was admitted to subacute care  services with therapies initiated daily consisting of physical therapy,  occupational therapy and  rehabilitation nursing.  The following issues were  addressed during the patient's rehabilitation stay:   Pertaining to Ms. Hiegel's right total hip replacement September 18, 2005, surgical  site healing nicely.  Neurovascular sensation intact.  She was ambulating  touchdown weightbearing with hip precautions.  She would follow up with Dr.  Simonne Come.  Home health therapies had been arranged per recommendations of  rehab services.  Pain management ongoing with the use of oxycodone and  Robaxin with good results.  She remained on Coumadin for deep vein  thrombosis prophylaxis with latest INR of 2.8.  She will complete Coumadin  protocol, followed by [facility], and resume her aspirin  therapy after Coumadin completed.  Postoperative anemia stable with latest  hemoglobin of 10, hematocrit 29.4.  Blood pressure is controlled with Avapro  and Aldactone.  She would remain on her Ativan and Zoloft for history of  anxiety, which was well-controlled.  She had no bowel  or bladder  disturbances.  Overall, for her functional status she was ambulating  extended household distances with a walker, essentially independent, stand-  by assist in all areas of activities of daily living addressing grooming and  homemaking.  Home health therapies had been arranged.   Discharge medications at time of dictation included:  1.  Coumadin with latest dose of Coumadin 6 mg, to be completed on October 19, 2005, and stopped.  2.  Aspirin 81 mg daily.  3.  Pepcid 20 mg twice daily.  4.  Ativan 1 mg at bedtime.  5.  Zoloft 50 mg daily.  6.  Aldactone 25 mg daily.  7.  Avapro 150 mg daily.  8.  Oxycodone 5 mg one or two tablets every four hours as needed for pain.  9.  Robaxin 500 mg every six hours as needed spasms.  10. Albuterol inhaler 90 mcg one puff four times daily as needed.   ACTIVITY:  Touchdown weightbearing with hip precautions.   DIET:  Regular.   WOUND CARE:  Cleanse incision daily  with warm soap and water.  Call Dr.  Simonne Come for any increased redness, drainage or fever.   Home health nurse per Genevieve Norlander to complete Coumadin protocol.  Home health  therapies per [facility].      [name], P.A.      Ranelle Oyster, M.D.  Electronically Signed    DA/MEDQ  D:  10/02/2005  T:  10/03/2005  Job:  [id number]   cc:   Titus Dubin. Alwyn Ren, M.D. [facility]  [phone] [place]  [place] [address]   Marlowe Kays, M.D.  Fax: [phone]

## 2010-11-30 NOTE — H&P (Signed)
[name], SECRIST NO.:  [id number]   MEDICAL RECORD NO.:  [id number]          PATIENT TYPE:  ORB   LOCATION:  4522                         FACILITY:  [facility]   PHYSICIAN:  Erick Colace, M.D.DATE OF BIRTH:  11/09/1934   DATE OF ADMISSION:  09/24/2005  DATE OF DISCHARGE:                                HISTORY & PHYSICAL   REASON FOR ADMISSION:  Decline in self care and mobility skills following  left total hip replacement for osteoarthritis.   HISTORY:  A 76 year old female with history of left total hip replacement.  She was in [place] in October 2006, was admitted to [facility] September 18, 2005, with end-stage changes of her right hip and no relief with  conservative care.  She underwent right total hip replacement September 18, 2005,  by Dr. Fayrene Fearing [place], placed on Coumadin for DVT prophylaxis and made  touchdown weightbearing.  Postoperatively had anemia with a hemoglobin of  8.8 and was transfused 2 units of packed red blood cells with last  hemoglobin of 9.8.  Her pain has been controlled with p.o. pain medications.  She started physical therapy on the inpatient acute care unit.   REVIEW OF SYSTEMS:  Negative for chest pain, negative for nausea and  vomiting.   PAST MEDICAL HISTORY:  1.  Hypertension.  2.  Anxiety and depression.  3.  Asthma.   PAST SURGICAL HISTORY:  Hysterectomy.   HABITS:  Negative ETOH.  Negative tobacco.   FAMILY HISTORY:  Positive CAD, positive CVA.   SOCIAL HISTORY:  Retired, lives alone.  Son in the area works, but the  family can check on as needed.  One-level home, one to two steps to enter.   FUNCTIONAL HISTORY:  Independent with cane.   FUNCTIONAL STATUS:  Needs assistance with ADLs and mobility.   MEDICATIONS:  1.  Avapro 150 mg a day.  2.  Aldactone 25 mg p.o. daily.  3.  Sertraline 50 mg p.o. daily.  4.  Lorazepam 1 mg nightly.  5.  Aspirin 81 mg p.o. daily.  6.  Zantac 1 p.o. daily.  7.   Albuterol p.r.n.   ALLERGIES:  LABETALOL.   PHYSICAL EXAMINATION:  GENERAL:  Elderly female in no acute distress.  Mood  and affect appropriate.  VITAL SIGNS: Blood pressure 166/86, pulse 80, respirations 20, temperature  98.9.  HEENT:  Eyes anicteric, not injected.  External ENT normal.  NECK:  Supple without adenopathy.  LUNGS:  Respiratory effort good.  Lungs clear.  HEART: Regular rate and rhythm.  No rubs, murmurs, or extra sounds.  ABDOMEN: Positive bowel sounds.  Soft, nontender to palpation.  EXTREMITIES:  No clubbing, cyanosis, or edema.  NEUROLOGIC: She has normal mood, memory, and affect.  Cranial nerves II-XII  intact.  Sensation is reduced right dorsum of the foot but intact in the  fifth digit.  Motor strength is 5/5 bilateral deltoid, biceps, triceps, grip  but has 1/5 in the hip flexor, quad; 2- in the TA, 2- in the gastroc.  Left  side is 5/5 in  the hip flexor, quad, TA, and gastroc.   IMPRESSION:  1.  Right total hip replacement due to alert and oriented, postoperative day      #6.  2.  Postoperative anemia.  We will follow her CBCs, monitor hemoglobin,      transfuse as needed. Will order stools for OP.  3.  Hypertension.  Continue Avapro, aldactone.  4.  Anxiety.  Continue Ativan and Zoloft.  5.  History of asthma.  Continue albuterol inhaler p.r.n.  6.  Deep vein thrombosis prophylaxis using Coumadin per pharmacy protocol.  7.  Right foot drop with dorsum of foot numbness.  May have sciatic versus      peroneal neuropathy.   ESTIMATED LENGTH OF STAY:  7 to 10 days.   The patient is a good rehab candidate.  Estimated function on discharge is  intermittent supervision using a walker for ADLs and mobility.      Erick Colace, M.D.  Electronically Signed     AEK/MEDQ  D:  09/24/2005  T:  09/24/2005  Job:  [id number]

## 2010-11-30 NOTE — Discharge Summary (Signed)
[name], HINE NO.:  [id number]   MEDICAL RECORD NO.:  [id number]          PATIENT TYPE:  IPS   LOCATION:  4009                         FACILITY:  [facility]   PHYSICIAN:  Ellwood Dense, M.D.   DATE OF BIRTH:  Apr 02, 1935   DATE OF ADMISSION:  05/03/2005  DATE OF DISCHARGE:  05/10/2005                                 DISCHARGE SUMMARY   DISCHARGE DIAGNOSES:  1.  Left total hip replacement secondary to osteoarthritis April 25, 2005.  2.  Pain management.  3.  Coumadin for deep venous thrombosis prophylaxis.  4.  Postoperative anemia.  5.  Non Q-wave myocardial infarction with negative cardiac catheterization.  6.  Hypertension.  7.  Asthma.  8.  Anxiety.  9.  Gastroesophageal reflux disease.   PROCEDURES:  1.  Left total hip replacement April 25, 2005 per Dr. Simonne Come.  2.  Cardiac catheterization per Dr. Graciela Husbands, cardiology services.   HISTORY OF PRESENT ILLNESS:  This is a 76 year old female admitted [facility] October 12 with end-stage changes bone on bone left hip and no  relief with conservative care.  Underwent a left total hip replacement  October 12 per Dr. Simonne Come.  Placed on Coumadin for deep venous thrombosis  prophylaxis, touchdown weightbearing.  Postoperative vague chest pain versus  epigastric discomfort.  Troponin level 0.1.  EKG with non Q-wave myocardial  infarction.  Placed on IV heparin.  Underwent cardiac catheterization per  [facility] October 19 that showed no obstruction, normal left  ventricular function.  No other cardiac events noted.  Anemia 8.2.  Transfused 2 units of packed red blood cells October 18.  Gastroenterology  follow-up for postoperative anemia with conservative care.  Placed on PPI.  Admit for comprehensive rehabilitation program.   PAST MEDICAL HISTORY:  See discharge diagnoses.  No alcohol or tobacco.   ALLERGIES:  None.   SOCIAL HISTORY:  Lives alone.  Daughter-in-law to assist as  needed.  One-  level home.  No steps to entry.  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   MEDICATIONS PRIOR TO ADMISSION:  Ibuprofen, B12, multivitamin, albuterol  inhaler, lorazepam, aldactone, Lasix, Avapro, magnesium oxide, and aspirin  81 mg daily.   HOSPITAL COURSE:  Patient with progressive gains while on rehabilitation  services with therapies initiated on a b.i.d. basis.  The following issues  were followed during patient's rehabilitation course.  Pertaining to Ms.  Faerber's left total hip replacement secondary to osteoarthritis October 12,  surgical site healing nicely.  No signs of infection.  Total hip  precautions.  touchdown weightbearing.  Pain management ongoing with the use  of Vicodin and Robaxin with good results.  She remained on Coumadin for deep  venous thrombosis prophylaxis with latest INR of 1.9.  She remained on  subcutaneous Lovenox until INR greater than 2.  Home health nursing had been  set up for necessary Coumadin therapy.  Postoperative anemia was stable  after transfusion.  Latest hemoglobin 9.7, hematocrit 28.4.  She had no  other cardiac events, a negative cardiac catheterization.  No chest pain  or  shortness of breath.  Her blood pressures remained well controlled with  diastolic 65-76.  She continued on her Avapro, Lopressor, aldactone as  advised and would follow up with cardiology services as needed.  She had a  history of asthma.  Oxygen saturations 92%.  She would continue on albuterol  inhaler.  She had no bowel or bladder disturbances.  Functionally she was  min assist supervision for mobility, modified independence wheelchair,  supervision car transfers, minimal assist for activities of daily living.  She was discharged to home.   DISCHARGE MEDICATIONS:  1.  Coumadin latest dose of 6 mg to be completed on May 26, 2005.  2.  Protonix 40 mg b.i.d.  3.  Lopressor 25 mg every six hours.  4.  Avapro 150 mg daily.  5.  Multivitamin daily.  6.   Aldactone 25 mg daily.  7.  Vitamin B12 1000 mcg daily.  8.  Ativan 1 mg at bedtime.  9.  Robaxin 500 mg every six hours as needed.  10. Albuterol inhaler two puffs four times daily as needed.  11. Vicodin as needed pain.   ACTIVITY:  Touchdown weightbearing with hip precautions.   DIET:  Regular.   SPECIAL INSTRUCTIONS:  Home health nursing to complete Coumadin protocol.  Patient would resume aspirin therapy after Coumadin completed.  Follow up  Dr. Simonne Come, orthopedic services.      [name], P.A.    ______________________________  Ellwood Dense, M.D.    DA/MEDQ  D:  05/09/2005  T:  05/09/2005  Job:  [id number]   cc:   Duke Salvia, M.D.  [address]  [address]  [place]  [place] [address]   Marlowe Kays, M.D.  Fax: [phone]   Titus Dubin. Alwyn Ren, M.D. [facility]  [phone] [place]  [place] [address]

## 2010-11-30 NOTE — Consult Note (Signed)
NAMENARA, PATERNOSTER NO.:  [id number]   MEDICAL RECORD NO.:  [id number]          PATIENT TYPE:  INP   LOCATION:  1429                         [facility]   PHYSICIAN:  Titus Dubin. Alwyn Ren, M.D. [facility] OF BIRTH:  [birth year]   DATE OF CONSULTATION:  04/27/2005  DATE OF DISCHARGE:                                   CONSULTATION   [name] is a 76 year old African-American female who is status post total  hip replacement as of October 12. The evening of October 13 she describes  some epigastric and substernal burning without nausea or diaphoresis. She  also describes a sensation of tasting beef broth. Mylanta relieved the  symptoms almost immediately. She had no neck or arm radiation. A troponin  was 0.1, prompting Dr. Otelia Sergeant to request evaluation.   PAST MEDICAL HISTORY:  1.  She was hospitalized in 2003 with congestive heart failure.  2.  Total abdominal hysterectomy and bilateral salpingo-oophorectomy for      fibroids.  3.  She is gravida 1, para 1.   FAMILY HISTORY:  Stroke, hypertension, diabetes, stomach cancer.   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 She does not smoke or  drink.   She had been physically inactive for several weeks prior to admission  because of her hip pain.   MEDICATIONS AT TIME OF ADMISSION:  1.  Spironolactone 25 mg daily.  2.  Furosemide 40 mg daily.  3.  Avapro 300 mg 1/2 tablet daily.  4.  Lorazepam 1 mg 1/2 at h.s.  5.  Albuterol p.r.n. for an asthmatic component.   PHYSICAL EXAMINATION:  VITAL SIGNS:  Pulse was 72 and regular. Respiratory  rate was 16 and unlabored. Her blood pressure has ranged from 115/64 to  168/79. At this time there is no increased work of breathing; she has no  wheezing. She is stable with no chest pain or burning.  CARDIOVASCULAR:  An S4 is noted with a grade 1 systolic murmur.  ABDOMEN:  Bowel sounds are active.  EXTREMITIES:  She has antiembolic compression devices on her  calves.   Serial EKGs on October 5 and April 27, 2005 were reviewed. She has subtle  nonspecific T changes inferolaterally with low voltage.   She has had no recurrence of symptoms. Troponin is worrisome but  nondiagnostic. Serial cardiac enzymes and EKGs will be monitored. A proton  pump inhibitor has been initiated and this is appropriate in view of the  rapid response of her symptoms to the Mylanta and because of her history of  hiatal hernia in the past. She will need to be monitored to rule out any  significant active cardiopulmonary process.      Titus Dubin. Alwyn Ren, M.D. [facility]  Electronically Signed     [facility]/MEDQ  D:  04/27/2005  T:  04/27/2005  Job:  [id number]

## 2010-11-30 NOTE — H&P (Signed)
[facility]  Patient:    [name], [name] Visit Number: [id number] MRN: [id number]          Service Type: MED Location: 3W [phone] Attending Physician:  Dolores Patty Dictated by:   Titus Dubin. Alwyn Ren, M.D. [facility] Admit Date:  11/13/2001                           History and Physical  HISTORY OF PRESENT ILLNESS:  The patient is a 76 year old African-American gentlewoman admitted with congestive heart failure.  She was seen this week for follow-up of her hypertension.  She was on labetalol 300 mg twice a day and Diovan 160/HCT 12.5 mg daily with excellent blood pressure control.  She felt that she was intolerant to the labetalol. She thought it was causing arthralgias and myalgias.  She denied any rash or fever.  The labetalol was to be weaned and the Diovan HCT stopped.  She was going to be placed on Tarka 2/240, combination of an ACE inhibitor and calcium-channel blocker.  On Nov 12, 2001, she began to have head congestion with chills and sweats. Subsequently, she developed shortness of breath prompting her to come to the emergency room at 2:30 this morning.  Additionally, on the evening of Nov 12, 2001, she had right otalgia and bilateral frontal headache with pressure phenomenon.  She additionally noted the production of yellow sputum.  In the emergency room she was found to have mild congestive heart failure which was treated with diuretics.  Significantly, she had not started the samples of Tarka but had decreased her labetalol to 300 mg 1/2 twice a day and had taken furosemide 40 mg daily prescribed as well.  PAST MEDICAL HISTORY:  Total abdominal hysterectomy and bilateral salpingo-oophorectomy for fibroids.  She has had one pregnancy and one delivery.  FAMILY HISTORY:  Her father had stroke and hypertension.  Mother had hypertension and bradycardia.  Paternal grandmother may have had diabetes. Maternal grandmother had stomach  cancer.  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  She does not smoke or drink.  MEDICATIONS:  Listed above.  ALLERGIES:  She is unsure as to specific allergies.  She did develop a rash and some angioedema while on doxycycline, but she had received a flu shot as well.  REVIEW OF SYSTEMS:  As noted above.  She denies any chest pain or abdominal pain or bowel changes.  She did have some nausea.  She has had no genitourinary symptoms.  PHYSICAL EXAMINATION:  GENERAL:  In no acute distress at this time.  VITAL SIGNS:  Blood pressure 172/77, pulse 78, O2 saturations 95%, respiratory rate 20.  HEENT:  There is mild erythema of the right tympanic membrane and the nares, but the findings are not dramatic.  She has full extraocular motion of the eyes.  There is no evidence of blepharitis, and the sclerae and conjunctivae are clear.  She does have arteriolar narrowing on funduscopic exam.  Dental hygiene is excellent.  She has no lymphadenopathy about the head, neck, or axilla.  LUNGS:  Breath sounds are decreased, but there is no increased work of breathing.  HEART:  Distant S4, without gallops or dysrhythmias.  ABDOMEN:  Protuberant, nontender.  No organomegaly or masses.  EXTREMITIES:  Pedal pulses are intact.  No edema.  Homans sign is negative.  BREASTS, PELVIC, RECTAL:  Deferred as not pertaining to this admission.  LABORATORY DATA:  Chest x-ray reveals  some mild congestive heart failure.  PLAN:  She will be admitted to telemetry with cardiac enzymes.  A 2-D echocardiogram will be performed.  The sinus pressure, chills, fever, and purulent sputum will be evaluated with sinus films, and she will receive Rocephin empirically pending these results.  Nutritional consultation will be performed to address weight excess and sodium restriction.  She has had asthmatic bronchitis in the past, possibly related to reflux, and she will be continued on her albuterol as  needed.  Protonix will be initiated prophylactically for reflux.  Additionally, cardiac risk factors will be addressed with hemoglobin A1C and fasting lipids.  Heart failure protocol will be initiated with low-dose ACE inhibitor and spironolactone.  Ultimately, she may be a candidate for carvedilol. Dictated by:   Titus Dubin. Alwyn Ren, M.D. [facility] Attending Physician:  Dolores Patty DD:  11/13/01 TD:  11/14/01 Job: [id number]

## 2010-11-30 NOTE — Op Note (Signed)
NAMELAVONNA, LAMPRON NO.:  [id number]   MEDICAL RECORD NO.:  [id number]          PATIENT TYPE:  INP   LOCATION:  1508                         [facility]   PHYSICIAN:  Marlowe Kays, M.D.  DATE OF BIRTH:  Feb 25, 1935   DATE OF PROCEDURE:  04/25/2005  DATE OF DISCHARGE:                                 OPERATIVE REPORT   PREOPERATIVE DIAGNOSIS:  Osteoarthritis, left hip.   POSTOPERATIVE DIAGNOSIS:  Osteoarthritis, left hip.   OPERATION:  Osteonics total hip replacement, left.   SURGEON:  Marlowe Kays, M.D.   ASSISTANT:  Georges Lynch. Darrelyn Hillock, M.D.   ANESTHESIA:  General.   JUSTIFICATION FOR PROCEDURE:   PROCEDURE:  She has severe detrusor and complete loss of joint space in the  left hip with lesser changes of the right.  She is also very heavy making  the surgery more difficult.   DESCRIPTION OF PROCEDURE:  Prophylactic antibiotics, satisfactory general  anesthesia, Foley catheter inserted, right lateral decubitus position on the  Clinton II frame.  The left hip was prepped with DuraPrep and draped in a  sterile field.  An Collier Flowers was employed.  Posterolateral incision down to the  fascia lata.  __________  was cut.  Exposure was difficult because of the  size of the detrusor.  I was unable to dislocate the femoral head because of  this, and cut the femoral neck in situ.  We then began the reaming process  for the femoral shaft.  The piriformis fossa was exposed and guide pin  placed over drilling.  I had a difficult time getting into the canal.  I  forced my way through a hard cortical bone with a step-cut drill and placed  a guide pin down to which I perforated through the proximal femur.  Consequently, we brought in the C-arm and used the flexible reamers to ream  up to a 10.5 mm.  Along the way, I began the rasping process.  Preoperative  templating indicated probably a 7, and we were able to get up to that level  along the long using the greater  trochanteric reamer and also made my final  femoral neck cut a fingerbreadth above the lesser trochanter.  We were able  to ream for the distal tip on the prosthesis as well and had a 10.5.  The  femoral head proved to be very difficult to remove.  We tried several  modalities and finally began simply just reaming it out, which allowed [place] to  make it smaller, and we then removed it a Kocher clamp.  She had almost no  acetabular wall so we were very careful about any deepening but did expand  her up to a 50.  We then placed  morselized bone from the femur and the  femoral head in the acetabulum impacting it and then going through a trial  reduction which appeared to be excellent.  We then went back to the femur,  and after placing a trial cup went through a trial reduction with good  stability using a +7 prosthesis.  With this in mind, we then proceeded to  place the final acetabular shell using a 50/52 and cup centered with 10  degrees with scribe line at 1 o'clock.  We used a final 127 degree Secure  Fit size 7 prosthesis which we gently impacted with no iatrogenic injury  noted and went through another trial reduction and found that we had to use  a -5 C tapered head with 36 mm head to reduce the hip which was then nice  and stable.  Final C tapered head was placed.  The hip was reduced and found  to be stable in all directions.  We irrigated well with sterile saline,  closed the wound with interrupted #1 Vicryl and __________ band, external  rotators, fascia lata and deep subcutaneous tissue with 2-0 Vicryl  superficial and subcutaneous tissue and  staples in the skin.  Betadine, Adaptic and dry sterile dressing were  applied.  She was gently placed onto her bed on her back, and an abduction  pillow.  Toes were up.  She was taken to the recovery room in satisfactory  condition with no known complications.           ______________________________  Marlowe Kays, M.D.     JA/MEDQ   D:  04/25/2005  T:  04/26/2005  Job:  [id number]

## 2010-12-28 ENCOUNTER — Other Ambulatory Visit: Payer: Self-pay | Admitting: Internal Medicine

## 2011-01-01 ENCOUNTER — Other Ambulatory Visit: Payer: Self-pay | Admitting: Internal Medicine

## 2011-01-21 ENCOUNTER — Other Ambulatory Visit: Payer: Self-pay | Admitting: Internal Medicine

## 2011-04-08 ENCOUNTER — Encounter: Payer: Self-pay | Admitting: Internal Medicine

## 2011-04-08 ENCOUNTER — Ambulatory Visit (INDEPENDENT_AMBULATORY_CARE_PROVIDER_SITE_OTHER): Payer: Medicare Other | Admitting: Internal Medicine

## 2011-04-08 DIAGNOSIS — Z87898 Personal history of other specified conditions: Secondary | ICD-10-CM

## 2011-04-08 DIAGNOSIS — I10 Essential (primary) hypertension: Secondary | ICD-10-CM

## 2011-04-08 DIAGNOSIS — R0609 Other forms of dyspnea: Secondary | ICD-10-CM

## 2011-04-08 DIAGNOSIS — R110 Nausea: Secondary | ICD-10-CM

## 2011-04-08 DIAGNOSIS — E8881 Metabolic syndrome: Secondary | ICD-10-CM

## 2011-04-08 DIAGNOSIS — E785 Hyperlipidemia, unspecified: Secondary | ICD-10-CM

## 2011-04-08 DIAGNOSIS — R7309 Other abnormal glucose: Secondary | ICD-10-CM

## 2011-04-08 DIAGNOSIS — L299 Pruritus, unspecified: Secondary | ICD-10-CM

## 2011-04-08 DIAGNOSIS — M545 Low back pain: Secondary | ICD-10-CM

## 2011-04-08 DIAGNOSIS — D649 Anemia, unspecified: Secondary | ICD-10-CM

## 2011-04-08 NOTE — Patient Instructions (Addendum)
Please  report response to the Spiriva for shortness of breath. Please complete stool cards.Order for x-rays entered into  the computer; these will be performed at [facility] across from [facility]. No appointment is necessary.

## 2011-04-08 NOTE — Progress Notes (Signed)
Subjective:    Patient ID: [name], female    DOB: Jun 08, 1935, 76 y.o.   MRN: [id number]  HPI Flank Pain: Location: L > R PAL  above waist  Onset: last month   Radiation: to AAL  Severity: up to 7 Quality: aching  Duration: up to hours  Better with: ASTylenol, Aleve with benefit  Worse with: ? When constipated, not affected by exercise Symptoms Nausea/Vomiting: yes, nausea with pain  Diarrhea: no  Constipation: yes, occasionally  Melena/BRBPR: no  Hematemesis: no  Anorexia: no  Fever/Chills: no  Dysuria: no  Rash: no  Wt loss: no  Vaginal bleeding: no  Past Surgeries: last colonoscopy ? , it was negative  Dyspnea: Onset: chronic for > 1 year, worse with above Character : DOE after 15 ft, stable over past year; no PNDyspnea Cough/Sputum production/Hemoptysis:no Wheezing:no Chest pain, edema, palpitations:no Treatment/efficacy:Rescue MDI helps Use of maintenance inhaler:no Smoking: never Past medical history: Asthma ,post resp infections, onset 06/1989 Family history pulmonary disease: no  PMH: see Problem List ; unexplained anemia       Review of Systems   The generalized body aches is actually what she describes above. She will have some pruritus in the same areas as well as under the breasts.  She denies itchy eyes or sneezing.  Relatives state that she snores no one has reported apnea.     Objective:   Physical Exam Gen.: well-nourished in appearance. Alert, appropriate and cooperative throughout exam. Weight excess Nose: External nasal exam reveals no deformity or inflammation. Nasal mucosa are pink and moist. No lesions or exudates noted.   Mouth: Oral mucosa and oropharynx reveal no lesions or exudates. Upper plate ; lower partial. Oropharyngeal crowding Neck: No deformities, masses, or tenderness noted.  Thyroid normal. Lungs: Normal respiratory effort; chest expands symmetrically. Lungs are clear to auscultation without rales, wheezes, or  increased work of breathing. Heart: Normal rate and rhythm. Normal S1 and S2. No gallop, click, or rub. Grade 1/6 systolic  murmur. Abdomen: Bowel sounds normal; abdomen soft but minimally tender LUQ. No masses, organomegaly or hernias noted.                                                                  Musculoskeletal/extremities: ROM essentially   normal .Tone & strength  normal.Joints normal. Nail health  Good. No cyanosis or edema. Slight clubbing of fingers.Lordosis of thoracic spine Vascular: Carotid, radial artery, dorsalis pedis and  posterior tibial pulses are full and equal. No bruits present.Neg Homan's.  Neurologic: Alert and oriented x3. Deep tendon reflexes symmetrical and normal.          Skin: Intact without suspicious lesions or rashes. Lymph: No cervical, axillary  lymphadenopathy present. Psych: Mood and affect are normal. Normally interactive  Assessment & Plan:  #1 bilateral flank pain, greater on the left. This is most likely related to the spine (T10 radicular pain) as opposed to a primary abdominal process  #2 exertional dyspnea  #3 pruritus without significant skin lesions.  Plan: see orders and recommendations.

## 2011-04-09 ENCOUNTER — Ambulatory Visit (INDEPENDENT_AMBULATORY_CARE_PROVIDER_SITE_OTHER)
Admission: RE | Admit: 2011-04-09 | Discharge: 2011-04-09 | Disposition: A | Discharge status: Home / Self Care | Payer: Medicare Other | Source: Ambulatory Visit | Attending: Internal Medicine | Admitting: Internal Medicine

## 2011-04-09 DIAGNOSIS — R0609 Other forms of dyspnea: Secondary | ICD-10-CM

## 2011-04-09 LAB — SEDIMENTATION RATE: Sed Rate: 65 mm/hr — ABNORMAL HIGH (ref 0–22)

## 2011-04-09 LAB — CBC WITH DIFFERENTIAL/PLATELET
Basophils Absolute: 0 10*3/uL (ref 0.0–0.1)
Eosinophils Absolute: 0.5 10*3/uL (ref 0.0–0.7)
Hemoglobin: 11.2 g/dL — ABNORMAL LOW (ref 12.0–15.0)
Lymphocytes Relative: 18.1 % (ref 12.0–46.0)
Monocytes Relative: 7 % (ref 3.0–12.0)
Neutro Abs: 6.2 10*3/uL (ref 1.4–7.7)
Neutrophils Relative %: 68.9 % (ref 43.0–77.0)
RBC: 3.53 Mil/uL — ABNORMAL LOW (ref 3.87–5.11)
RDW: 12.1 % (ref 11.5–14.6)

## 2011-04-09 LAB — BASIC METABOLIC PANEL
Calcium: 9.8 mg/dL (ref 8.4–10.5)
Creatinine, Ser: 1.3 mg/dL — ABNORMAL HIGH (ref 0.4–1.2)
GFR: 52.8 mL/min — ABNORMAL LOW (ref >60.00)
Glucose, Bld: 96 mg/dL (ref 70–99)
Sodium: 141 mEq/L (ref 135–145)

## 2011-04-09 LAB — LIPID PANEL
Cholesterol: 165 mg/dL (ref 0–200)
HDL: 36.1 mg/dL — ABNORMAL LOW (ref >39.00)
Total CHOL/HDL Ratio: 5
Triglycerides: 68 mg/dL (ref 0.0–149.0)

## 2011-04-09 LAB — HEPATIC FUNCTION PANEL
ALT: 12 U/L (ref 0–35)
AST: 20 U/L (ref 0–37)
Albumin: 4.2 g/dL (ref 3.5–5.2)
Alkaline Phosphatase: 106 U/L (ref 39–117)
Total Protein: 8 g/dL (ref 6.0–8.3)

## 2011-04-09 LAB — HEMOGLOBIN A1C: Hgb A1c MFr Bld: 5.7 % (ref 4.6–6.5)

## 2011-04-10 ENCOUNTER — Other Ambulatory Visit: Payer: Self-pay

## 2011-04-10 MED ORDER — SERTRALINE HCL 50 MG PO TABS: 50.0000 mg | ORAL_TABLET | Freq: Every day | ORAL | Status: DC

## 2011-04-10 NOTE — Telephone Encounter (Signed)
Rx sent 

## 2011-04-12 ENCOUNTER — Other Ambulatory Visit: Payer: Self-pay | Admitting: Internal Medicine

## 2011-04-12 ENCOUNTER — Telehealth: Payer: Self-pay | Admitting: Internal Medicine

## 2011-04-12 DIAGNOSIS — M549 Dorsalgia, unspecified: Secondary | ICD-10-CM

## 2011-04-12 DIAGNOSIS — R700 Elevated erythrocyte sedimentation rate: Secondary | ICD-10-CM

## 2011-04-12 DIAGNOSIS — M545 Low back pain: Secondary | ICD-10-CM

## 2011-04-12 MED ORDER — GABAPENTIN 100 MG PO CAPS: 100.0000 mg | ORAL_CAPSULE | Freq: Three times a day (TID) | ORAL | Status: DC | PRN

## 2011-04-12 NOTE — Telephone Encounter (Signed)
Patient wants result of xray - still having pain in side --- cell [id number] ---- wants dr hopper to call her

## 2011-04-12 NOTE — Telephone Encounter (Signed)
Generic Tssalon 100 mg every 8 hrs prn cough # 30

## 2011-04-12 NOTE — Telephone Encounter (Signed)
Please advise ok to fill gabapentin pt has allergy to cheratussin AC.

## 2011-04-12 NOTE — Telephone Encounter (Signed)
Spoke with patient, patient aware of labs results and chest xray, patient has no recommendation for Rheumatology. Patient ok with whom ever Dr.Hopper recommends.   Patient states she is still with pain, Dr.Hopper please further advise and place order for Rheumatology referral

## 2011-04-12 NOTE — Telephone Encounter (Signed)
Gabapentin 100 mg , 1 every 8 hrs as needed for the back / flank pain #30 until seen by thr Rheumatologist

## 2011-04-12 NOTE — Telephone Encounter (Addendum)
Per Dr Laury Axon ok to fill med no contraindication noted. Pt aware Rx sent

## 2011-04-29 ENCOUNTER — Other Ambulatory Visit (INDEPENDENT_AMBULATORY_CARE_PROVIDER_SITE_OTHER): Payer: Medicare Other

## 2011-04-29 DIAGNOSIS — Z1211 Encounter for screening for malignant neoplasm of colon: Secondary | ICD-10-CM

## 2011-04-29 LAB — HEMOCCULT GUIAC POC 1CARD (OFFICE): Card #2 Fecal Occult Blod, POC: NEGATIVE

## 2011-05-08 ENCOUNTER — Encounter: Payer: Self-pay | Admitting: Internal Medicine

## 2011-05-08 ENCOUNTER — Encounter: Payer: Self-pay | Admitting: Emergency Medicine

## 2011-05-10 ENCOUNTER — Encounter: Payer: Self-pay | Admitting: Internal Medicine

## 2011-05-10 ENCOUNTER — Ambulatory Visit (INDEPENDENT_AMBULATORY_CARE_PROVIDER_SITE_OTHER): Payer: TRICARE For Life (TFL) | Admitting: Internal Medicine

## 2011-05-10 DIAGNOSIS — L0291 Cutaneous abscess, unspecified: Secondary | ICD-10-CM

## 2011-05-10 DIAGNOSIS — L0390 Cellulitis, unspecified: Secondary | ICD-10-CM

## 2011-05-10 DIAGNOSIS — D179 Benign lipomatous neoplasm, unspecified: Secondary | ICD-10-CM

## 2011-05-10 MED ORDER — LORAZEPAM 1 MG PO TABS: ORAL_TABLET | ORAL | Status: DC

## 2011-05-10 NOTE — Progress Notes (Signed)
  Subjective:    Patient ID: [name], female    DOB: 06-08-1935, 76 y.o.   MRN: [id number]  HPI Lesion : Location: upper thoracic spine area @ site of chronically enlarged tissue @ base of neck Character: large pimple Onset:10/19 as papule after she had scratched this area due to itching over several days Trigger/injury:no Pain, redness, swelling:no Constitutional: no fever, chills, sweats Heme: no abnormal bruising or clotting, lymphadenopathy Treatment/response:Gold Loyal cream ; she also used a topical antibiotic ointment    Review of Systems     Objective:   Physical Exam she is healthy in appearance and in no acute distress  There is lipomatous tissue over the upper thoracic spine at the base of the neck. This does transilluminate. There is a central eschar present. There is no evidence of cellulitis or abscess.  She has no lymphadenopathy about the head, neck, or axilla.  She has no organomegaly or masses of the abdomen        Assessment & Plan:  #1 apparently she had a local pustule in the area of a chronic lipoma. She apparently developed some local infection after scratching this area. This has responded to the topical antibiotic. The lipoma most likely is related to the lordotic curve of her upper thoracic spine.

## 2011-05-10 NOTE — Patient Instructions (Signed)
Dip gauze in  sterile saline and applied to the wound twice a day. Cover the wound with Telfa , non stick dressing  without any antibiotic ointment. The saline can be purchased at the drugstore or you can make your own .Boil cup of salt in a gallon of water. Store mixture  in a clean container.Report Warning  signs as discussed (red streaks, pus, fever, increasing pain). 

## 2011-07-10 ENCOUNTER — Ambulatory Visit (INDEPENDENT_AMBULATORY_CARE_PROVIDER_SITE_OTHER): Payer: TRICARE For Life (TFL) | Admitting: Internal Medicine

## 2011-07-10 ENCOUNTER — Encounter: Payer: Self-pay | Admitting: Internal Medicine

## 2011-07-10 VITALS — BP 122/74 | HR 60 | Temp 97.5°F | Wt 248.0 lb

## 2011-07-10 DIAGNOSIS — J209 Acute bronchitis, unspecified: Secondary | ICD-10-CM

## 2011-07-10 MED ORDER — LEVOFLOXACIN 500 MG PO TABS: 500.0000 mg | ORAL_TABLET | Freq: Every day | ORAL | Status: AC

## 2011-07-10 MED ORDER — FLUTICASONE-SALMETEROL 250-50 MCG/DOSE IN AEPB: 1.0000 | INHALATION_SPRAY | Freq: Two times a day (BID) | RESPIRATORY_TRACT | Status: DC

## 2011-07-10 NOTE — Progress Notes (Signed)
  Subjective:    Patient ID: [name], female    DOB: October 13, 1934, 76 y.o.   MRN: [id number]  HPI Respiratory tract infection Onset/symptoms:12/11 as "cold" Exposures (illness/environmental/extrinsic):no Progression of symptoms:to chest congestion  Treatments/response:seen @ [facility] in [place] , [place] & given neb treatment, antibiotic (Z pack)  & steroid  with benefit. Rescue MDI 3X/day on average Present symptoms: Fever/chills/sweats:no Frontal headache:no Facial pain:no Nasal purulence:no Sore throat:no Dental pain:no Lymphadenopathy:no Wheezing/shortness of breath:occasionally Cough/sputum/hemoptysis:sputum is thick & yellow, scant this am Pleuritic pain:no Associated extrinsic/allergic symptoms:itchy eyes/ sneezing:no Past medical history: Seasonal allergies : no/asthma:no but RAD symptoms with RTI Smoking history:never           Review of Systems     Objective:   Physical Exam In  no acute distress; no increased work of breathing is present.  No  lymphadenopathy about the head, neck, or axilla noted.   Eyes: No conjunctival inflammation or lid edema is present. .  Ears:  External ear exam shows no significant lesions or deformities.  Otoscopic examination reveals clear canals, tympanic membranes are intact bilaterally without bulging, retraction, inflammation or discharge.  Nose:  External nasal examination shows no deformity or inflammation. Nasal mucosa are dry without lesions or exudates. No septal dislocation .No obstruction to airflow.   Oral exam: Upper plate & lower partial; lips and gums are healthy appearing.There is no oropharyngeal erythema or exudate noted.     Heart:  Normal rate and regular rhythm. S1 and S2 normal without gallop, murmur, click, rub S 4 Lungs:Chest clear to auscultation; no wheezes, rhonchi,rales ,or rubs present.No increased work of breathing.    Extremities:  No cyanosis or edema. Minimal  clubbing suggested  Skin: Warm & dry           Assessment & Plan:   #1 bronchitis, acute. She describes reactive airways disease symptoms but no bronchospasm is present clinically. By history symptoms are resolving.  She'll be given a sample of a long-acting bronchodilator/anti-inflammatory agent. She'll also be given a prescription for generic Levaquin if  symptoms fail to resolve completely.

## 2011-07-10 NOTE — Patient Instructions (Signed)
Plain Mucinex for thick secretions ;force NON dairy fluids. Use a Neti pot daily as needed for sinus congestion Advair one inhalation every 12 hours; gargle and spit after use  

## 2011-07-15 ENCOUNTER — Other Ambulatory Visit: Payer: Self-pay | Admitting: Internal Medicine

## 2011-10-04 ENCOUNTER — Other Ambulatory Visit: Payer: Self-pay | Admitting: Internal Medicine

## 2011-10-09 ENCOUNTER — Ambulatory Visit (INDEPENDENT_AMBULATORY_CARE_PROVIDER_SITE_OTHER): Payer: Medicare Other | Admitting: Internal Medicine

## 2011-10-09 VITALS — BP 138/76 | HR 59 | Temp 98.0°F | Wt 249.0 lb

## 2011-10-09 DIAGNOSIS — T887XXA Unspecified adverse effect of drug or medicament, initial encounter: Secondary | ICD-10-CM

## 2011-10-09 DIAGNOSIS — R490 Dysphonia: Secondary | ICD-10-CM

## 2011-10-09 NOTE — Patient Instructions (Signed)
Zicam Melts or Zinc lozenges ; vitamin C 2000 mg daily; & Echinacea for 4-7 days. Report fever, exudate("pus") or progressive pain. Magic mouthwash as directed

## 2011-10-09 NOTE — Progress Notes (Signed)
  Subjective:    Patient ID: [name], female    DOB: 06-18-1935, 77 y.o.   MRN: [id number]  HPI Several days ago she developed a sore throat followed by hoarseness. She denies frontal headache, facial pain, nasal purulence, earache or discharge. She also has not had fever, chills, itchy watery eyes or sneezing.The ST has resolved  She was using saline gargles without response  She denies any cough or sputum production.      Review of Systems     Objective:   Physical Exam General appearance:good health ;well nourished; no acute distress or increased work of breathing is present.  No  lymphadenopathy about the head, neck, or axilla noted.   Eyes: No conjunctival inflammation or lid edema is present.  Ears:  External ear exam shows no significant lesions or deformities.  Otoscopic examination reveals clear canals, tympanic membranes are intact bilaterally without bulging, retraction, inflammation or discharge.  Nose:  External nasal examination shows no deformity or inflammation. Nasal mucosa are boggy & moist without lesions or exudates. No septal dislocation or deviation.No obstruction to airflow.   Oral exam: Upper plate & lower partial; lips and gums are healthy appearing.There is mild  oropharyngeal edema w/o or exudate    Neck:  No deformities, thyromegaly, masses, or tenderness noted.   Supple with full range of motion without pain.   Heart:  Normal rate and regular rhythm. S1 and S2 normal without gallop,  click, rub or other extra sounds. Grade 1/6 systolic murmur   Lungs:Chest clear to auscultation; no wheezes, rhonchi,rales ,or rubs present.No increased work of breathing. Dry raspy cough  Extremities:  No cyanosis, edema, or clubbing  noted    Skin: Warm & dry           Assessment & Plan:   #1 hoarseness following a sore throat which is resolved. NP cough present today is new  Plan: See orders and recommendations

## 2011-10-14 ENCOUNTER — Telehealth: Payer: Self-pay | Admitting: Internal Medicine

## 2011-10-14 MED ORDER — MAGIC MOUTHWASH: 5.0000 mL | Freq: Three times a day (TID) | ORAL | Status: DC | PRN

## 2011-10-14 NOTE — Telephone Encounter (Signed)
Rx sent, called and informed pharmacy

## 2011-10-14 NOTE — Telephone Encounter (Signed)
90 cc , 5 cc gargled well & swallowed tid prn

## 2011-10-14 NOTE — Telephone Encounter (Signed)
Caller: Tara/ RPh at [facility]; PCP: Marga Melnick; CB#: [phone]; Call regarding Medication Issue;  Magic Mouthwash 5 cc gargle TID and swallow written 10/09/11; RX missing quantity and duration of use to know how much to dispense.  Info noted and sent to MD for pharmacy requesting non urgent RX clarification per Med Question Call Guideline.  Please call back.

## 2011-11-22 ENCOUNTER — Other Ambulatory Visit: Payer: Self-pay | Admitting: Internal Medicine

## 2011-12-27 ENCOUNTER — Other Ambulatory Visit: Payer: Self-pay | Admitting: Internal Medicine

## 2011-12-27 DIAGNOSIS — Z1231 Encounter for screening mammogram for malignant neoplasm of breast: Secondary | ICD-10-CM

## 2012-01-04 ENCOUNTER — Other Ambulatory Visit: Payer: Self-pay | Admitting: Internal Medicine

## 2012-01-21 ENCOUNTER — Ambulatory Visit (HOSPITAL_COMMUNITY)
Admission: RE | Admit: 2012-01-21 | Discharge: 2012-01-21 | Disposition: A | Discharge status: Home / Self Care | Payer: Medicare Other | Source: Ambulatory Visit | Attending: Internal Medicine | Admitting: Internal Medicine

## 2012-01-21 DIAGNOSIS — Z1231 Encounter for screening mammogram for malignant neoplasm of breast: Secondary | ICD-10-CM | POA: Insufficient documentation

## 2012-02-25 ENCOUNTER — Other Ambulatory Visit: Payer: Self-pay | Admitting: Internal Medicine

## 2012-02-26 NOTE — Telephone Encounter (Signed)
Lipid/Hep 272.4/995.20  

## 2012-03-23 ENCOUNTER — Telehealth: Payer: Self-pay | Admitting: Internal Medicine

## 2012-03-23 ENCOUNTER — Encounter: Payer: Self-pay | Admitting: Internal Medicine

## 2012-03-23 NOTE — Telephone Encounter (Signed)
WANTS to know when she got last Colonoscopy  Callback# [phone]

## 2012-03-23 NOTE — Telephone Encounter (Signed)
Spoke with patient, patient aware last Colonoscopy was 05/2076, next due date 05/2016 Per Dr.Kaplan's note on colonoscopy report

## 2012-03-30 ENCOUNTER — Other Ambulatory Visit: Payer: Self-pay | Admitting: Internal Medicine

## 2012-03-31 ENCOUNTER — Encounter: Payer: Self-pay | Admitting: Internal Medicine

## 2012-04-03 ENCOUNTER — Ambulatory Visit (INDEPENDENT_AMBULATORY_CARE_PROVIDER_SITE_OTHER): Payer: Medicare Other

## 2012-04-03 DIAGNOSIS — Z23 Encounter for immunization: Secondary | ICD-10-CM

## 2012-06-03 ENCOUNTER — Ambulatory Visit (INDEPENDENT_AMBULATORY_CARE_PROVIDER_SITE_OTHER): Payer: Medicare Other | Admitting: Internal Medicine

## 2012-06-03 ENCOUNTER — Encounter: Payer: Self-pay | Admitting: Internal Medicine

## 2012-06-03 VITALS — BP 122/78 | HR 57 | Temp 98.0°F | Resp 12 | Ht 66.0 in | Wt 242.4 lb

## 2012-06-03 DIAGNOSIS — Z2911 Encounter for prophylactic immunotherapy for respiratory syncytial virus (RSV): Secondary | ICD-10-CM

## 2012-06-03 DIAGNOSIS — D649 Anemia, unspecified: Secondary | ICD-10-CM

## 2012-06-03 DIAGNOSIS — I10 Essential (primary) hypertension: Secondary | ICD-10-CM

## 2012-06-03 DIAGNOSIS — E785 Hyperlipidemia, unspecified: Secondary | ICD-10-CM

## 2012-06-03 DIAGNOSIS — R9431 Abnormal electrocardiogram [ECG] [EKG]: Secondary | ICD-10-CM

## 2012-06-03 DIAGNOSIS — Z23 Encounter for immunization: Secondary | ICD-10-CM

## 2012-06-03 DIAGNOSIS — Z0000 Encounter for general adult medical examination without abnormal findings: Secondary | ICD-10-CM

## 2012-06-03 DIAGNOSIS — R7309 Other abnormal glucose: Secondary | ICD-10-CM

## 2012-06-03 LAB — HEMOGLOBIN A1C: Hgb A1c MFr Bld: 5.7 % (ref 4.6–6.5)

## 2012-06-03 LAB — BASIC METABOLIC PANEL
BUN: 25 mg/dL — ABNORMAL HIGH (ref 6–23)
Chloride: 106 mEq/L (ref 96–112)
Creatinine, Ser: 1.4 mg/dL — ABNORMAL HIGH (ref 0.4–1.2)
Glucose, Bld: 114 mg/dL — ABNORMAL HIGH (ref 70–99)
Potassium: 4.3 mEq/L (ref 3.5–5.1)

## 2012-06-03 LAB — CBC WITH DIFFERENTIAL/PLATELET
Basophils Absolute: 0.1 10*3/uL (ref 0.0–0.1)
Eosinophils Absolute: 0.2 10*3/uL (ref 0.0–0.7)
HCT: 31 % — ABNORMAL LOW (ref 36.0–46.0)
Lymphs Abs: 1.3 10*3/uL (ref 0.7–4.0)
MCHC: 32.4 g/dL (ref 30.0–36.0)
MCV: 96 fl (ref 78.0–100.0)
Monocytes Absolute: 0.5 10*3/uL (ref 0.1–1.0)
Neutrophils Relative %: 74.8 % (ref 43.0–77.0)
Platelets: 282 10*3/uL (ref 150.0–400.0)
RDW: 12.1 % (ref 11.5–14.6)

## 2012-06-03 LAB — LIPID PANEL
Cholesterol: 138 mg/dL (ref 0–200)
HDL: 29.9 mg/dL — ABNORMAL LOW (ref >39.00)
Triglycerides: 77 mg/dL (ref 0.0–149.0)
VLDL: 15.4 mg/dL (ref 0.0–40.0)

## 2012-06-03 LAB — HEPATIC FUNCTION PANEL
AST: 18 U/L (ref 0–37)
Albumin: 3.7 g/dL (ref 3.5–5.2)

## 2012-06-03 MED ORDER — PRAVASTATIN SODIUM 20 MG PO TABS: ORAL_TABLET | ORAL | Status: DC

## 2012-06-03 MED ORDER — SERTRALINE HCL 50 MG PO TABS: 50.0000 mg | ORAL_TABLET | Freq: Every day | ORAL | Status: DC

## 2012-06-03 MED ORDER — LOSARTAN POTASSIUM 100 MG PO TABS: ORAL_TABLET | ORAL | Status: DC

## 2012-06-03 MED ORDER — SPIRONOLACTONE 25 MG PO TABS: ORAL_TABLET | ORAL | Status: DC

## 2012-06-03 MED ORDER — ALBUTEROL SULFATE HFA 108 (90 BASE) MCG/ACT IN AERS: INHALATION_SPRAY | RESPIRATORY_TRACT | Status: DC

## 2012-06-03 NOTE — Progress Notes (Signed)
Subjective:    Patient ID: [name], female    DOB: 11/21/1934, 77 y.o.   MRN: [id number]  HPI Medicare Wellness Visit:  The following psychosocial & medical history were reviewed as required by Medicare.   Social history: caffeine: tea occasionally , alcohol: never ,  tobacco use : never  & exercise : minimal.   Home & personal  safety / fall risk: no issue, activities of daily living: no limitations , seatbelt use : yes , and smoke alarm employment : yes .  Power of Attorney/Living Will status : ?POA  Vision ( as recorded per Nurse) & Hearing  evaluation :  Ophth exam 2013; no hearing exam. Orientation :oriented X 3 , memory & recall :good,  math testing: good,and mood & affect : normal . Depression / anxiety: denied Travel history : 3 [place] , immunization status :Shingles needed , transfusion history: yes post GI bleed, and preventive health surveillance ( colonoscopies, BMD , etc as per protocol/ [facility]): colonoscopy due 2017, Dental care:  Every 12 mos or as needed . Chart reviewed &  Updated. Active issues reviewed & addressed.       Review of Systems HYPERTENSION: Disease Monitoring: Blood pressure range-<140/90  Chest pain, palpitations- no       Dyspnea- DOE;see exercise above Medications: Compliance- yes Lightheadedness,Syncope-no    Edema- no  FASTING HYPERGLYCEMIA, PMH of:  Polyuria/phagia/dipsia- no       Visual problems- no; Ophth exam negative post cataract surgery  HYPERLIPIDEMIA: Disease Monitoring: See symptoms for Hypertension Medications: Compliance- yes Abd pain, bowel changes- no Muscle aches- occasional leg cramps        Objective:   Physical Exam Gen.:  well-nourished in appearance. Alert, appropriate and cooperative throughout exam. Head: Normocephalic without obvious abnormalities  Eyes: No corneal or conjunctival inflammation noted. Extraocular motion intact. Faint arcus Ears: External  ear exam reveals no significant lesions or  deformities. Canals clear .TMs normal. Hearing is grossly normal bilaterally. Nose: External nasal exam reveals no deformity or inflammation. Nasal mucosa are pink and moist. No lesions or exudates noted.   Mouth: Oral mucosa and oropharynx reveal no lesions or exudates. Upper plate; lower partial. Neck: No deformities, masses, or tenderness noted. Range of motion & Thyroid normal Lungs: Normal respiratory effort; chest expands symmetrically. Lungs are clear to auscultation without rales, wheezes, or increased work of breathing.Decreased BS Heart: Normal rate and rhythm. Normal S1 and S2. No gallop, click, or rub. Grade 1/2 over 6 systolic.Distant heart sounds murmur  Abdomen: Bowel sounds normal; abdomen soft and nontender. No masses, organomegaly or hernias noted. Genitalia: S/P TAH & BSO                                                        Musculoskeletal/extremities: Accentuated  thoracic curvature. No clubbing, cyanosis, edema, or deformity noted. Tone & strength  normal.Joints normal. Nail health  Good.Crepitus knees with decreased ROM Vascular: Carotid, radial artery, dorsalis pedis and  posterior tibial pulses are full and equal. No bruits present. Neurologic: Alert and oriented x3. Deep tendon reflexes symmetrical and normal.          Skin: Intact without suspicious lesions or rashes. Lymph: No cervical, axillary lymphadenopathy present. Psych: Mood and affect are normal. Normally interactive  Assessment & Plan:  #1 Medicare Wellness Exam; criteria met ; data entered #2 Problem List reviewed ; Assessment/ Recommendations made Plan: see Orders

## 2012-06-03 NOTE — Patient Instructions (Addendum)
Take the EKG to any emergency room or preop visits. There are nonspecific changes; as long as there is no new change these are not clinically significant. Preventive Health Care: Exercise  30-45  minutes a day, 3-4 days a week. Water aerobics would be valuable. Eat a low-fat diet with lots of fruits and vegetables, up to 7-9 servings per day.  Consume less than 30 grams of sugar per day from foods & drinks with High Fructose Corn Syrup as #1,2,3 or #4 on label. Health Care Power of Attorney & Living Will place you in charge of your health care  decisions. Verify these are  in place.

## 2012-06-08 ENCOUNTER — Telehealth: Payer: Self-pay

## 2012-06-08 DIAGNOSIS — D649 Anemia, unspecified: Secondary | ICD-10-CM

## 2012-06-08 NOTE — Telephone Encounter (Signed)
Message copied by Maurice Small on Mon Jun 08, 2012 10:36 AM ------      Message from: Pecola Lawless      Created: Sat Jun 06, 2012  7:42 AM       Please see instructions. She needs to come in for repeat CBC with iron panel and B-12 level and pickup stool cards. She needs to stop spironolactone. Appointment to three days after labs with ALL pill bottles.

## 2012-06-08 NOTE — Telephone Encounter (Signed)
I called home number, unable to leave message. I called other number and VM box not set up yet, I will try to reach patient again later

## 2012-06-08 NOTE — Telephone Encounter (Signed)
I called patient again on home number, no answer, no voicemail. I will try to reach patient again later

## 2012-06-10 NOTE — Telephone Encounter (Signed)
Spoke with, placed future orders for [facility], mailed stool cards, scheduled follow-up with Dr.Hopper for 06/19/12. Patient verbalized understanding to STOP spironolactone

## 2012-06-17 ENCOUNTER — Other Ambulatory Visit (INDEPENDENT_AMBULATORY_CARE_PROVIDER_SITE_OTHER): Payer: Medicare Other

## 2012-06-17 DIAGNOSIS — D649 Anemia, unspecified: Secondary | ICD-10-CM

## 2012-06-17 LAB — IBC PANEL: Iron: 87 ug/dL (ref 42–145)

## 2012-06-17 LAB — CBC WITH DIFFERENTIAL/PLATELET
Eosinophils Relative: 1.8 % (ref 0.0–5.0)
HCT: 31 % — ABNORMAL LOW (ref 36.0–46.0)
Lymphs Abs: 1.7 10*3/uL (ref 0.7–4.0)
MCHC: 32.7 g/dL (ref 30.0–36.0)
MCV: 95.9 fl (ref 78.0–100.0)
Monocytes Absolute: 0.5 10*3/uL (ref 0.1–1.0)
Platelets: 309 10*3/uL (ref 150.0–400.0)
WBC: 8.5 10*3/uL (ref 4.5–10.5)

## 2012-06-19 ENCOUNTER — Encounter: Payer: Self-pay | Admitting: Internal Medicine

## 2012-06-19 ENCOUNTER — Ambulatory Visit (INDEPENDENT_AMBULATORY_CARE_PROVIDER_SITE_OTHER): Payer: Medicare Other | Admitting: Internal Medicine

## 2012-06-19 VITALS — BP 124/72 | HR 79 | Temp 98.2°F | Wt 240.8 lb

## 2012-06-19 DIAGNOSIS — D649 Anemia, unspecified: Secondary | ICD-10-CM

## 2012-06-19 MED ORDER — FUROSEMIDE 40 MG PO TABS: ORAL_TABLET | ORAL | Status: DC

## 2012-06-19 NOTE — Progress Notes (Signed)
  Subjective:    Patient ID: [name], female    DOB: 1935-02-12, 77 y.o.   MRN: [id number]  HPI  An incidental finding of 06/03/12 was anemia with hematocrit 31. Platelet count, differential, and RDW were normal. Repeat hematocrit revealed that the anemia is stable. Iron and B12 levels were normal    Review of Systems She has intermittent epigastric low-grade discomfort especially with tomato-based or spicy foods.  OTC Zantac & Gaviscon She denies dysphagia, melena, unexplained weight loss, or rectal bleeding.  She also denies any bleeding dyscrasias. Specifically she has had no epistaxis, hemoptysis, hematuria,vagiunal bleeding, abnormal bruising or bleeding, or difficult to stopping any posttraumatic bleeding. She takes oral B12 supplement; the B12 level was actually supranormal. She is not a blood donor  Her last colonoscopy  in 2007 was negative; repeat in 2017 was recommended.     Objective:   Physical Exam General appearance : good health and nourishment w/o distress.  Eyes: No conjunctival inflammation or scleral icterus is present.  Oral exam: Upper denture ; lower partial ; lips and gums are healthy appearing.There is no oropharyngeal erythema or exudate noted.   Heart:  Normal rate and regular rhythm. S1 and S2 normal without gallop,  click, rub or other extra sounds  .Grade 1/2 over 6 systolic murmur    Lungs:Chest clear to auscultation; no wheezes, rhonchi,rales ,or rubs present.No increased work of breathing.   Abdomen: bowel sounds normal, soft and non-tender without masses, organomegaly or hernias noted.  No guarding or rebound   Skin:Warm & dry.  Intact without suspicious lesions or rashes ; no  tenting  Lymphatic: No lymphadenopathy is noted about the head, neck, axilla             Assessment & Plan:

## 2012-06-19 NOTE — Patient Instructions (Addendum)
The triggers for reflux  include stress; the "aspirin family" ; alcohol; peppermint; and caffeine (coffee, tea, cola, and chocolate). The aspirin family would include aspirin and the nonsteroidal agents such as ibuprofen &  Naproxen. Tylenol would not cause reflux. If having symptoms ; food & drink should be avoided for @ least 2 hours before going to bed. Please send completed  stool cards back. PLEASE BRING THESE INSTRUCTIONS TO FOLLOW UP  LAB APPOINTMENT in 10 weeks for CBC & dif OFF B12 supplements.This will guarantee correct labs are drawn, eliminating need for repeat blood sampling ( needle sticks ! ). Diagnoses /Codes: 285.9.

## 2012-06-19 NOTE — Assessment & Plan Note (Signed)
Symptoms suggest reflux and possible hiatal hernia. Antireflux measures discussed. Stool cards pending

## 2012-07-06 ENCOUNTER — Other Ambulatory Visit (INDEPENDENT_AMBULATORY_CARE_PROVIDER_SITE_OTHER): Payer: Medicare Other

## 2012-07-06 DIAGNOSIS — Z1289 Encounter for screening for malignant neoplasm of other sites: Secondary | ICD-10-CM

## 2012-07-06 LAB — POC HEMOCCULT BLD/STL (OFFICE/1-CARD/DIAGNOSTIC): Fecal Occult Blood, POC: NEGATIVE

## 2012-08-24 ENCOUNTER — Other Ambulatory Visit: Payer: Self-pay | Admitting: *Deleted

## 2012-08-24 DIAGNOSIS — D649 Anemia, unspecified: Secondary | ICD-10-CM

## 2012-08-25 ENCOUNTER — Other Ambulatory Visit (INDEPENDENT_AMBULATORY_CARE_PROVIDER_SITE_OTHER): Payer: Medicare Other

## 2012-08-25 DIAGNOSIS — D649 Anemia, unspecified: Secondary | ICD-10-CM

## 2012-08-25 LAB — CBC WITH DIFFERENTIAL/PLATELET
Eosinophils Relative: 1.6 % (ref 0.0–5.0)
HCT: 34.2 % — ABNORMAL LOW (ref 36.0–46.0)
Hemoglobin: 11.3 g/dL — ABNORMAL LOW (ref 12.0–15.0)
Lymphs Abs: 1.4 10*3/uL (ref 0.7–4.0)
Monocytes Relative: 7.9 % (ref 3.0–12.0)
Neutro Abs: 5.9 10*3/uL (ref 1.4–7.7)
RDW: 12.4 % (ref 11.5–14.6)
WBC: 8 10*3/uL (ref 4.5–10.5)

## 2012-08-28 ENCOUNTER — Other Ambulatory Visit: Payer: Self-pay | Admitting: Internal Medicine

## 2012-08-31 ENCOUNTER — Encounter: Payer: Self-pay | Admitting: *Deleted

## 2012-08-31 NOTE — Telephone Encounter (Signed)
Last OV 06-19-12, last filled 11-22-11 #30 1

## 2012-08-31 NOTE — Telephone Encounter (Signed)
OK X1 

## 2012-10-21 ENCOUNTER — Ambulatory Visit: Payer: Medicare PPO | Admitting: Internal Medicine

## 2012-12-23 ENCOUNTER — Other Ambulatory Visit: Payer: Self-pay | Admitting: Internal Medicine

## 2012-12-23 DIAGNOSIS — Z1231 Encounter for screening mammogram for malignant neoplasm of breast: Secondary | ICD-10-CM

## 2012-12-28 ENCOUNTER — Encounter: Payer: Self-pay | Admitting: Lab

## 2012-12-29 ENCOUNTER — Ambulatory Visit (INDEPENDENT_AMBULATORY_CARE_PROVIDER_SITE_OTHER): Payer: Medicare PPO | Admitting: Internal Medicine

## 2012-12-29 VITALS — BP 130/78 | HR 54 | Temp 98.3°F | Wt 248.0 lb

## 2012-12-29 DIAGNOSIS — R252 Cramp and spasm: Secondary | ICD-10-CM

## 2012-12-29 DIAGNOSIS — R110 Nausea: Secondary | ICD-10-CM

## 2012-12-29 DIAGNOSIS — M545 Low back pain: Secondary | ICD-10-CM

## 2012-12-29 MED ORDER — CYCLOBENZAPRINE HCL 5 MG PO TABS: ORAL_TABLET | ORAL | Status: DC

## 2012-12-29 NOTE — Progress Notes (Signed)
  Subjective:    Patient ID: [name], female    DOB: [birth year], 78 y.o.   MRN: [id number]  HPI  She describes intermittent aching pain from the lower thoracic spine radiating to the anterior abdomen bilaterally since 10/24/12. There was no definite trigger or injury for this. There no definite exacerbating factors. It does seem to respond to Tylenol arthritis and topical anti-inflammatory cream.  With the severe back pain in April she did have associated nausea. This has not recurred with the intermittent back pain with radicular component as described.She is concerned statin might be involved  She describes pain in her toes because of fungal foot infection. OTC cream did not help    Review of Systems She's had no associated rash in this area. She denies fever, chills, sweats or weight loss. There is no associated hematuria, pyuria, or dysuria. She has not had numbness, tingling, or weakness in her legs. There's been no associated incontinence of urine or stool.   She did not have dysphagia, dyspepsia, abdominal pain with nausea. She has not had melena or rectal bleeding.    Objective:   Physical Exam Gen.:  well-nourished in appearance. Alert, appropriate and cooperative throughout exam.  Head: Normocephalic without obvious abnormalities  Eyes: No corneal or conjunctival inflammation noted. No icterus Nose: External nasal exam reveals no deformity or inflammation. Nasal mucosa are pink and moist. No lesions or exudates noted.   Mouth: Oral mucosa and oropharynx reveal no lesions or exudates. Upper plate & lower partial. Neck: No deformities, masses, or tenderness noted. Thyroid normal. Lungs: Normal respiratory effort; chest expands symmetrically. Lungs are clear to auscultation without rales, wheezes, or increased work of breathing. Heart: Normal rate and rhythm. Normal S1 and S2. No gallop, click, or rub.   Abdomen: Bowel sounds normal; abdomen soft and nontender. No masses,  organomegaly or hernias noted.                                Musculoskeletal/extremities: Accentuated curvature of upper thoracic  Spine. No clubbing, cyanosis, edema, or significant extremity  deformity noted. Range of motion normal .Tone & strength  Normal. Joints normal except hallux R foot. Thickened, dark  toenails. Pes planus Able to lie down & sit up w/o help. Negative SLR bilaterally Vascular: Carotid, radial artery, dorsalis pedis and  posterior tibial pulses are full and equal. No bruits present. Neurologic: Alert and oriented x3. Deep tendon reflexes symmetrical and normal.  Gait normal  including heel & toe walking .        Skin: Intact without suspicious lesions or rashes. Lymph: No cervical, axillary lymphadenopathy present. Psych: Mood and affect are normal. Normally interactive                                                                                        Assessment & Plan:  #1 LBP; statin induced myalgia unlikely #2 chronic toenail changes. Podiatry referral declined #3 isolated nausea, quiescent @ present See Orders

## 2012-12-29 NOTE — Patient Instructions (Addendum)
The best exercises for the low back include freestyle swimming, stretch aerobics, and yoga.  Please come in for a CK, muscle test, if the low back pain flares.

## 2013-02-09 ENCOUNTER — Ambulatory Visit (HOSPITAL_COMMUNITY): Payer: Medicare PPO

## 2013-02-16 ENCOUNTER — Ambulatory Visit (HOSPITAL_COMMUNITY)
Admission: RE | Admit: 2013-02-16 | Discharge: 2013-02-16 | Disposition: A | Discharge status: Home / Self Care | Payer: Medicare PPO | Source: Ambulatory Visit | Attending: Internal Medicine | Admitting: Internal Medicine

## 2013-02-16 DIAGNOSIS — Z1231 Encounter for screening mammogram for malignant neoplasm of breast: Secondary | ICD-10-CM | POA: Insufficient documentation

## 2013-03-09 ENCOUNTER — Ambulatory Visit (INDEPENDENT_AMBULATORY_CARE_PROVIDER_SITE_OTHER): Payer: Medicare PPO | Admitting: Nurse Practitioner

## 2013-03-09 ENCOUNTER — Encounter: Payer: Self-pay | Admitting: Nurse Practitioner

## 2013-03-09 VITALS — BP 120/70 | HR 55 | Temp 97.6°F | Ht 66.0 in | Wt 248.4 lb

## 2013-03-09 DIAGNOSIS — S39012A Strain of muscle, fascia and tendon of lower back, initial encounter: Secondary | ICD-10-CM

## 2013-03-09 DIAGNOSIS — IMO0002 Reserved for concepts with insufficient information to code with codable children: Secondary | ICD-10-CM

## 2013-03-09 MED ORDER — PREDNISONE 10 MG PO TABS: ORAL_TABLET | ORAL | Status: DC

## 2013-03-09 NOTE — Progress Notes (Signed)
  Subjective:    Patient ID: [name], female    DOB: [birth year], 78 y.o.   MRN: [id number]  Back Pain This is a new (she has chronic back pain, but has new onset of intense low back pain that radites into bilat buttocks & R LE) problem. The current episode started yesterday. The problem occurs constantly. The problem is unchanged. The pain is present in the gluteal and lumbar spine. The quality of the pain is described as aching and shooting. The pain radiates to the right knee and right thigh. The pain is moderate. The pain is the same all the time. The symptoms are aggravated by standing (walking). Associated symptoms include leg pain (R LE) and tingling (long-standing neuropathy in R foot not associated with this episode & not made worse). Pertinent negatives include no abdominal pain, chest pain, dysuria, headaches, numbness, paresis, pelvic pain, perianal numbness or weakness. Risk factors include lack of exercise, menopause and sedentary lifestyle (Hx deg disc disease, has significant kyphosis). She has tried NSAIDs for the symptoms. The treatment provided moderate relief.      Review of Systems  Constitutional: Negative for chills and activity change.  Respiratory: Negative for cough and shortness of breath.   Cardiovascular: Negative for chest pain and leg swelling.  Gastrointestinal: Negative for abdominal pain, diarrhea and rectal pain.  Genitourinary: Negative for dysuria and pelvic pain.  Musculoskeletal: Positive for back pain.  Neurological: Positive for tingling (long-standing neuropathy in R foot not associated with this episode & not made worse). Negative for weakness, numbness and headaches.       Objective:   Physical Exam  Vitals reviewed. Constitutional: She is oriented to person, place, and time. She appears well-developed and well-nourished. No distress.  HENT:  Head: Normocephalic and atraumatic.  Eyes: Conjunctivae are normal. Right eye exhibits no discharge.  Left eye exhibits no discharge.  Cardiovascular: Normal rate.   Pulmonary/Chest: Effort normal.  Musculoskeletal: She exhibits tenderness (mild discomfort to palpation over lumbar spine).  Significant kyphosis  Neurological: She is alert and oriented to person, place, and time.  Skin: Skin is warm and dry.  Psychiatric: She has a normal mood and affect. Her behavior is normal. Thought content normal.          Assessment & Plan:  1. Back strain, initial encounter Exacerbation of pain w/new symptom of pain radiation to RLE. History of chronic back pain & deg disc disease, lumbar films 2009 show deg disc disease w/ facet joint arthritis. Pt wants to take conservative approach w/steroids and NSAIDS. Deferred xray today. Is willing to go to PT if no improvement. Declined IM prednisone. - predniSONE (DELTASONE) 10 MG tablet; Take 4T po qd X 3d, then 3T po qd X 3d, then 2 T po qd X 3 d, then 1T po qd X 3d, then 1/2 T po qd X 3d, then d/c.  Dispense: 32 tablet; Refill: 0  Follow up in 3-4 weeks or sooner if no improvement.

## 2013-03-09 NOTE — Patient Instructions (Signed)
Start prednisone today. You may take 2 tabs advil every twice daily as needed for pain. You should notice improvement within 3-4 days. Please follow up with Dr. Alwyn Ren in 2-3 weeks or sooner if your pain gets worse or you have no improvement. Feel better! Pleasure to meet you!  Lumbosacral Strain Lumbosacral strain is one of the most common causes of back pain. There are many causes of back pain. Most are not serious conditions. CAUSES  Your backbone (spinal column) is made up of 24 main vertebral bodies, the sacrum, and the coccyx. These are held together by muscles and tough, fibrous tissue (ligaments). Nerve roots pass through the openings between the vertebrae. A sudden move or injury to the back may cause injury to, or pressure on, these nerves. This may result in localized back pain or pain movement (radiation) into the buttocks, down the leg, and into the foot. Sharp, shooting pain from the buttock down the back of the leg (sciatica) is frequently associated with a ruptured (herniated) disk. Pain may be caused by muscle spasm alone. Your caregiver can often find the cause of your pain by the details of your symptoms and an exam. In some cases, you may need tests (such as X-rays). Your caregiver will work with you to decide if any tests are needed based on your specific exam. HOME CARE INSTRUCTIONS   Avoid an underactive lifestyle. Active exercise, as directed by your caregiver, is your greatest weapon against back pain.  Avoid hard physical activities (tennis, racquetball, waterskiing) if you are not in proper physical condition for it. This may aggravate or create problems.  If you have a back problem, avoid sports requiring sudden body movements. Swimming and walking are generally safer activities.  Maintain good posture.  Avoid becoming overweight (obese).  Use bed rest for only the most extreme, sudden (acute) episode. Your caregiver will help you determine how much bed rest is  necessary.  For acute conditions, you may put ice on the injured area.  Put ice in a plastic bag.  Place a towel between your skin and the bag.  Leave the ice on for 15-20 minutes at a time, every 2 hours, or as needed.  After you are improved and more active, it may help to apply heat for 30 minutes before activities. See your caregiver if you are having pain that lasts longer than expected. Your caregiver can advise appropriate exercises or therapy if needed. With conditioning, most back problems can be avoided. SEEK IMMEDIATE MEDICAL CARE IF:   You have numbness, tingling, weakness, or problems with the use of your arms or legs.  You experience severe back pain not relieved with medicines.  There is a change in bowel or bladder control.  You have increasing pain in any area of the body, including your belly (abdomen).  You notice shortness of breath, dizziness, or feel faint.  You feel sick to your stomach (nauseous), are throwing up (vomiting), or become sweaty.  You notice discoloration of your toes or legs, or your feet get very cold.  Your back pain is getting worse.  You have a fever. MAKE SURE YOU:   Understand these instructions.  Will watch your condition.  Will get help right away if you are not doing well or get worse. Document Released: 04/10/2005 Document Revised: 09/23/2011 Document Reviewed: 09/30/2008 [facility] Patient Information 2014 [place], [place].

## 2013-03-11 ENCOUNTER — Ambulatory Visit: Payer: Self-pay | Admitting: Nurse Practitioner

## 2013-03-11 ENCOUNTER — Telehealth: Payer: Self-pay | Admitting: Nurse Practitioner

## 2013-03-11 ENCOUNTER — Ambulatory Visit (INDEPENDENT_AMBULATORY_CARE_PROVIDER_SITE_OTHER)
Admission: RE | Admit: 2013-03-11 | Discharge: 2013-03-11 | Disposition: A | Discharge status: Home / Self Care | Payer: Medicare PPO | Source: Ambulatory Visit | Attending: Nurse Practitioner | Admitting: Nurse Practitioner

## 2013-03-11 ENCOUNTER — Telehealth: Payer: Self-pay | Admitting: General Practice

## 2013-03-11 DIAGNOSIS — S39012A Strain of muscle, fascia and tendon of lower back, initial encounter: Secondary | ICD-10-CM

## 2013-03-11 DIAGNOSIS — IMO0002 Reserved for concepts with insufficient information to code with codable children: Secondary | ICD-10-CM

## 2013-03-11 MED ORDER — METHOCARBAMOL 500 MG PO TABS: ORAL_TABLET | ORAL | Status: DC

## 2013-03-11 NOTE — Telephone Encounter (Signed)
Pt called today in regards to her appointment from 8/26. Pt states that L. Alben Spittle told her to call back if not feeling better. Pt was advised that it could take a week for medication to begin working. Pt states that she is still in severe pain and would like to know what to do. Please advise.

## 2013-03-11 NOTE — Telephone Encounter (Signed)
Patient Information:  Caller Name: Enaya  Phone: [phone]  Patient: [name], [name]  Gender: Female  DOB: May 29, 1935  Age: 78 Years  PCP: Marga Melnick  Office Follow Up:  Does the office need to follow up with this patient?: No  Instructions For The Office: N/A  RN Note:  Called to ask for orger for spine xray and something stronger for pain.  Was unable to walk this morning due to pain.  Ambulatory now after 2 Ibuprofen tablets this morning and one at 1300.  Current pain rated 5/10. Pain radiates to right buttocks. No appointments left with usual office providers; scheduled for 03/11/13 at 1600 wtih Molly Maduro, NP in [place] office.  Symptoms  Reason For Call & Symptoms: Severe low back pain, worse on right side or with movement.  Reviewed Health History In EMR: Yes  Reviewed Medications In EMR: Yes  Reviewed Allergies In EMR: Yes  Reviewed Surgeries / Procedures: N/A  Date of Onset of Symptoms: 03/05/2013  Treatments Tried: Advil, Prednisone  Treatments Tried Worked: No  Guideline(s) Used:  Back Pain  Disposition Per Guideline:   Go to Office Now  Reason For Disposition Reached:   Severe back pain  Advice Given:  Reassurance:  Twisting or heavy lifting can cause back pain.  With treatment, the pain most often goes away in 1-2 weeks.  Cold or Heat:  Cold Pack: For pain or swelling, use a cold pack or ice wrapped in a wet cloth. Put it on the sore area for 20 minutes. Repeat 4 times on the first day, then as needed.  Heat Pack: If pain lasts over 2 days, apply heat to the sore area. Use a heat pack, heating pad, or warm wet washcloth. Do this for 10 minutes, then as needed. For widespread stiffness, take a hot bath or hot shower instead. Move the sore area under the warm water.  Pain Medicines:  Use the lowest amount of medicine that makes your pain feel better.  Ibuprofen (e.g., Motrin, Advil):  Take 400 mg (two 200 mg pills) by mouth every 6 hours.  The most you  should take each day is 1,200 mg (six 200 mg pills), unless your doctor has told you to take more.  Call Back If:  Numbness or weakness occur  Bowel/bladder problems occur  You become worse.  Patient Will Follow Care Advice:  YES

## 2013-03-11 NOTE — Telephone Encounter (Signed)
Xray shows deg changes. Will ref to PT. See phone note.

## 2013-03-11 NOTE — Telephone Encounter (Signed)
Please advise. I see where Layne had responded to you.

## 2013-03-11 NOTE — Telephone Encounter (Signed)
Prednisone has not helped after 2 days. Will order xrays & muscle relaxer.

## 2013-03-18 ENCOUNTER — Ambulatory Visit: Payer: Medicare PPO | Attending: Nurse Practitioner | Admitting: Physical Therapy

## 2013-03-18 DIAGNOSIS — IMO0001 Reserved for inherently not codable concepts without codable children: Secondary | ICD-10-CM | POA: Insufficient documentation

## 2013-03-18 DIAGNOSIS — Z96649 Presence of unspecified artificial hip joint: Secondary | ICD-10-CM | POA: Insufficient documentation

## 2013-03-18 DIAGNOSIS — M545 Low back pain, unspecified: Secondary | ICD-10-CM | POA: Insufficient documentation

## 2013-03-18 DIAGNOSIS — R293 Abnormal posture: Secondary | ICD-10-CM | POA: Insufficient documentation

## 2013-03-18 DIAGNOSIS — R5381 Other malaise: Secondary | ICD-10-CM | POA: Insufficient documentation

## 2013-03-25 ENCOUNTER — Ambulatory Visit: Payer: Medicare PPO | Admitting: Physical Therapy

## 2013-03-29 ENCOUNTER — Ambulatory Visit: Payer: Medicare PPO | Admitting: Physical Therapy

## 2013-04-01 ENCOUNTER — Ambulatory Visit: Payer: Medicare PPO | Admitting: Physical Therapy

## 2013-04-05 ENCOUNTER — Ambulatory Visit: Payer: Medicare PPO | Admitting: Physical Therapy

## 2013-04-06 ENCOUNTER — Ambulatory Visit (INDEPENDENT_AMBULATORY_CARE_PROVIDER_SITE_OTHER): Payer: Medicare PPO

## 2013-04-06 DIAGNOSIS — Z23 Encounter for immunization: Secondary | ICD-10-CM

## 2013-04-08 ENCOUNTER — Ambulatory Visit: Payer: Medicare PPO | Admitting: Physical Therapy

## 2013-04-20 ENCOUNTER — Ambulatory Visit (INDEPENDENT_AMBULATORY_CARE_PROVIDER_SITE_OTHER): Payer: Medicare PPO | Admitting: Internal Medicine

## 2013-04-20 ENCOUNTER — Encounter: Payer: Self-pay | Admitting: Internal Medicine

## 2013-04-20 VITALS — BP 152/80 | HR 59 | Temp 98.8°F | Wt 254.0 lb

## 2013-04-20 DIAGNOSIS — R51 Headache: Secondary | ICD-10-CM

## 2013-04-20 DIAGNOSIS — M542 Cervicalgia: Secondary | ICD-10-CM

## 2013-04-20 MED ORDER — GABAPENTIN 100 MG PO CAPS: ORAL_CAPSULE | ORAL | Status: DC

## 2013-04-20 NOTE — Patient Instructions (Addendum)
Assess response to the gabapentin one every 8 hours as needed. If it is partially beneficial, it can be increased up to a total of 3 pills every 8 hours as needed. This increase of 1 pill each dose  should take place over 72 hours at least. Please keep a diary of your headaches . Document  each occurrence on the calendar with notation of : #1 any prodrome ( any non headache symptom such as marked fatigue,visual changes, ,etc ) which precedes actual headache ; #2) severity on 1-10 scale; #3) any triggers ( food/ drink,enviromenntal or weather changes ,physical or emotional stress) in 8-12 hour period prior to the headache; & #4) response to any medications or other intervention. Please review "Headache" @ [facility] for additional information.

## 2013-04-20 NOTE — Progress Notes (Signed)
Subjective:    Patient ID: [name], female    DOB: 12/07/1934, 78 y.o.   MRN: [id number]  HPI  Symptoms began 04/19/13 5430 as sharp intermittent pain in the lower cervical spine. Over the next several hours it left this area and radiated to the left crown as a sharp "earache" like pain which was intermittent every few minutes. This pain radiated to her left jaw and ear.  There was no trigger or exacerbating factor for the neck pain or headache.  She's noted some minor throat discomfort and needing or swallowing. She's also had some "jumping" slightly of the left eye.  She didn't note some pain in the left ear 04/17/13 which resolved without treatment.  There is no personal or family history of migraines.    Review of Systems   She denies fever, chills, or sweats  There's been no rash or change in the color or temperature of skin in this area  She also denies blurred vision, double vision, or loss of vision  There's been no new hearing loss or tinnitus. She has had some tinnitus in the past.  She has no numbness or tingling in the face.  No associated vertigo  She also denies frontal headache, facial pain, nasal purulence, dental pain, or otic discharge.  No associated shoulder or hip girdle pain        Objective:   Physical Exam  Gen.:  Adequately nourished in appearance. Alert, appropriate and cooperative throughout exam. Weight excess  Head: Normocephalic without obvious abnormalities; patchy alopecia . Tender over L crown Eyes: No corneal or conjunctival inflammation noted.  FOV normal. Arcus. Extraocular motion intact. Vision grossly normal without lenses Ears: External  ear exam reveals no significant lesions or deformities. Canals clear .TMs normal. Hearing is grossly normal bilaterally. Tuning fork exam normal Nose: External nasal exam reveals no deformity or inflammation. Nasal mucosa are pink and moist. No lesions or exudates noted.   Mouth: Oral mucosa and  oropharynx reveal no lesions or exudates. Upper plate & lower partial Neck: No deformities, masses, or tenderness noted. Range of motion decreased ; pain with L lateral rotation. Thyroid normal. Lungs: Normal respiratory effort; chest expands symmetrically. Lungs are clear to auscultation without rales, wheezes, or increased work of breathing. Heart: Normal rate and rhythm. Normal S1 and S2. No gallop, click, or rub. Distant heart sounds.                             Musculoskeletal/extremities:  Accentuated curvature of upper thoracic  Spine. No clubbing, cyanosis, edema, or significant extremity  deformity noted. Tone & strength  Normal. Joints normal . Nail health good.  Vascular: Carotid, radial artery pulses are full and equal. No bruits present. Neurologic: Alert and oriented x3. Deep tendon reflexes symmetrical and 1/2+  Gait normal . Romberg and finger nose testing normal.        Skin: Intact without suspicious lesions or rashes. Lymph: No cervical, axillary lymphadenopathy present. Psych: Mood and affect are normal. Normally interactive  Assessment & Plan:  #1 cervical pain, resolved  #2 left crown headache with radiation to the ear and jaw. Tenderness to touch over this area.  #3 left  Retro mandibular tenderness without definite lymphadenopathy.  Rule out cervical neuralgia versus temporal arteritis. Historically polymyalgia rheumatica is not suggested  See orders

## 2013-04-21 ENCOUNTER — Encounter: Payer: Self-pay | Admitting: *Deleted

## 2013-04-21 NOTE — Progress Notes (Signed)
Mailed letter to patient

## 2013-04-22 ENCOUNTER — Telehealth: Payer: Self-pay | Admitting: *Deleted

## 2013-04-22 NOTE — Telephone Encounter (Signed)
Patient called and stated that she is still having pain in her head (she rated the pain 10/10). She is requesting something stronger for the pain. She states she is not sleeping at night. Please advise.

## 2013-04-23 ENCOUNTER — Ambulatory Visit (INDEPENDENT_AMBULATORY_CARE_PROVIDER_SITE_OTHER)
Admission: RE | Admit: 2013-04-23 | Discharge: 2013-04-23 | Disposition: A | Discharge status: Home / Self Care | Payer: Medicare PPO | Source: Ambulatory Visit | Attending: Internal Medicine | Admitting: Internal Medicine

## 2013-04-23 ENCOUNTER — Other Ambulatory Visit: Payer: Self-pay | Admitting: *Deleted

## 2013-04-23 DIAGNOSIS — R51 Headache: Secondary | ICD-10-CM

## 2013-04-23 MED ORDER — PREDNISONE 5 MG PO TABS: ORAL_TABLET | ORAL | Status: DC

## 2013-04-23 MED ORDER — PREDNISONE 5 MG PO TABS: 5.0000 mg | ORAL_TABLET | Freq: Every day | ORAL | Status: DC

## 2013-04-23 NOTE — Telephone Encounter (Signed)
Unable to take codeine according to records. The gabapentin should be increased up to 300 mg every 8 hours. Add low dose steroids due to elevated sed rate.CT scan without contrast will be ordered. If symptoms persist or progress; emergency room visit recommended.

## 2013-04-23 NOTE — Addendum Note (Signed)
Addended byMarga Melnick F on: 04/23/2013 12:37 PM   Modules accepted: Orders

## 2013-04-26 ENCOUNTER — Encounter: Payer: Self-pay | Admitting: *Deleted

## 2013-04-26 NOTE — Progress Notes (Signed)
Letter mailed to patient.

## 2013-05-27 ENCOUNTER — Other Ambulatory Visit: Payer: Self-pay | Admitting: Internal Medicine

## 2013-05-27 NOTE — Telephone Encounter (Signed)
Losartan refilled 

## 2013-06-29 ENCOUNTER — Other Ambulatory Visit: Payer: Self-pay | Admitting: Internal Medicine

## 2013-06-29 NOTE — Telephone Encounter (Signed)
Med filled.  

## 2013-07-08 ENCOUNTER — Other Ambulatory Visit: Payer: Self-pay | Admitting: Internal Medicine

## 2013-07-09 NOTE — Telephone Encounter (Signed)
Ventolin refilled per protocol. JG//CMA

## 2013-08-12 ENCOUNTER — Encounter: Payer: Self-pay | Admitting: Internal Medicine

## 2013-08-12 ENCOUNTER — Ambulatory Visit (INDEPENDENT_AMBULATORY_CARE_PROVIDER_SITE_OTHER): Payer: Medicare PPO | Admitting: Internal Medicine

## 2013-08-12 VITALS — BP 125/73 | HR 62 | Temp 97.9°F | Wt 254.6 lb

## 2013-08-12 DIAGNOSIS — M79661 Pain in right lower leg: Secondary | ICD-10-CM

## 2013-08-12 DIAGNOSIS — M79651 Pain in right thigh: Secondary | ICD-10-CM

## 2013-08-12 DIAGNOSIS — E785 Hyperlipidemia, unspecified: Secondary | ICD-10-CM

## 2013-08-12 DIAGNOSIS — M79662 Pain in left lower leg: Principal | ICD-10-CM

## 2013-08-12 DIAGNOSIS — M79609 Pain in unspecified limb: Secondary | ICD-10-CM

## 2013-08-12 DIAGNOSIS — M79652 Pain in left thigh: Secondary | ICD-10-CM

## 2013-08-12 DIAGNOSIS — Z23 Encounter for immunization: Secondary | ICD-10-CM

## 2013-08-12 LAB — CK: Total CK: 91 U/L (ref 7–177)

## 2013-08-12 LAB — SEDIMENTATION RATE: Sed Rate: 40 mm/hr — ABNORMAL HIGH (ref 0–22)

## 2013-08-12 NOTE — Patient Instructions (Signed)
Your next office appointment will be determined based upon review of your pending labs. Those instructions will be transmitted to you through My Chart  or by mail if you're not using this system.   Followup as needed for your this acute issue. Please report any significant change in your symptoms. 

## 2013-08-12 NOTE — Progress Notes (Signed)
   Subjective:    Patient ID: [name], female    DOB: May 05, 1935, 79 y.o.   MRN: [id number]  HPI Date of onset  leg pain : 1 week ago upon awakening Joint stiffness only day 1 in knees Location of pain: in thighs & calves POSTERIORLY only Character of pain ( dull ,sharp, cramping):dull Severity of pain (on 1-10 scale): previously  ; now 2/10 Radiation or movement of pain :no Duration of pain:constant  Any factors or activities which increase pain :better with NSAIDS   Past medical history of   bilateral total hip arthroplasty  She is on SSRI as well as low-dose pravastatin.     Review of Systems  Negative for: Redness Swelling Skin color or temperature change Fever, chills, sweats, change in weight  Weakness Numbness Tingling Loss of control of bowels,urine: stool/urine)  Enlarged  Lymph nodes Abnormal bruising or bleeding   She does have chronic exertional dyspnea. She denies palpitations or chest pain.        Objective:   Physical Exam  Gen.: Adequately nourished in appearance; but weight excess. Alert, appropriate and cooperative throughout exam.  Neck: No deformities, masses, or tenderness noted. Range of motion good.  Heart/chest: Distant heart breath sounds. S4 present without significant murmur. Abdomen: bowel sounds normal, soft and non-tender without masses, organomegaly or hernias noted.  No guarding or rebound. Protuberant                             Musculoskeletal/extremities: Lordosis of upper thoracic . No clubbing, cyanosis, edema, or significant extremity  deformity noted. Range of motion decreased @ knees .Tone & strength  normal.Joints normal. Nail health good. Able to lie down & sit up w/o help. Negative SLR bilaterally; but she describes pain in the right ankle at 45. There was no associated low back pain.  Equivocal Homans right calf Vascular:  dorsalis pedis and  posterior tibial pulses are full and equal. No AAA present. Neurologic:  Alert and oriented x3. Deep tendon reflexes symmetrical and normal.  Skin: Intact without suspicious lesions or rashes. Lymph: No cervical, axillary lymphadenopathy present. Psych: Mood and affect are normal. Normally interactive                                                                                     Assessment & Plan:  #1 posterior thigh and calf discomfort; sacral (S 1 & S 2) radiculopathy unlikely #2 combined statin and SSRI therapy  #3 equivocal Homans right calf; clinically no DVT  See orders

## 2013-08-12 NOTE — Progress Notes (Signed)
Pre visit review using our clinic review tool, if applicable. No additional management support is needed unless otherwise documented below in the visit note. 

## 2013-08-13 ENCOUNTER — Other Ambulatory Visit (HOSPITAL_COMMUNITY): Payer: Self-pay | Admitting: Internal Medicine

## 2013-08-13 ENCOUNTER — Ambulatory Visit (HOSPITAL_COMMUNITY)
Admission: RE | Admit: 2013-08-13 | Discharge: 2013-08-13 | Disposition: A | Discharge status: Home / Self Care | Payer: Medicare PPO | Source: Ambulatory Visit | Attending: Internal Medicine | Admitting: Internal Medicine

## 2013-08-13 ENCOUNTER — Emergency Department (HOSPITAL_COMMUNITY)
Admission: EM | Admit: 2013-08-13 | Disposition: A | Discharge status: Home / Self Care | Payer: Medicare PPO | Source: Home / Self Care

## 2013-08-13 ENCOUNTER — Other Ambulatory Visit: Payer: Self-pay | Admitting: Internal Medicine

## 2013-08-13 ENCOUNTER — Telehealth: Payer: Self-pay | Admitting: *Deleted

## 2013-08-13 ENCOUNTER — Encounter (HOSPITAL_COMMUNITY): Payer: Self-pay | Admitting: Emergency Medicine

## 2013-08-13 DIAGNOSIS — M79661 Pain in right lower leg: Secondary | ICD-10-CM

## 2013-08-13 DIAGNOSIS — M79609 Pain in unspecified limb: Secondary | ICD-10-CM | POA: Insufficient documentation

## 2013-08-13 DIAGNOSIS — M79606 Pain in leg, unspecified: Secondary | ICD-10-CM

## 2013-08-13 DIAGNOSIS — R799 Abnormal finding of blood chemistry, unspecified: Secondary | ICD-10-CM | POA: Insufficient documentation

## 2013-08-13 DIAGNOSIS — R7989 Other specified abnormal findings of blood chemistry: Secondary | ICD-10-CM

## 2013-08-13 DIAGNOSIS — M7989 Other specified soft tissue disorders: Principal | ICD-10-CM

## 2013-08-13 DIAGNOSIS — M79662 Pain in left lower leg: Principal | ICD-10-CM

## 2013-08-13 DIAGNOSIS — M79669 Pain in unspecified lower leg: Secondary | ICD-10-CM

## 2013-08-13 LAB — D-DIMER, QUANTITATIVE: D-Dimer, Quant: 1.85 ug/mL-FEU — ABNORMAL HIGH (ref 0.00–0.48)

## 2013-08-13 NOTE — Progress Notes (Addendum)
*  PRELIMINARY RESULTS* Vascular Ultrasound Lower extremity venous duplex has been completed.  Preliminary findings: no evidence of acute DVT.  Attempted call report to [phone] which went to voicemail. Will let patient leave.   Landry Mellow, RDMS, RVT  08/13/2013, 6:37 PM

## 2013-08-13 NOTE — ED Notes (Deleted)
Pt was sent to hospital for scheduled test but wrongly admitted to ED instead of admitting. Pt with no complaint or distress

## 2013-08-13 NOTE — ED Notes (Deleted)
The pt is having bi-lateral leg pain for several days.  She saw her doctor yesterday for the same in her rt leg.  She called her doctor today and was told to come in for further treatment

## 2013-08-13 NOTE — Telephone Encounter (Signed)
Message copied by Chilton Greathouse on Fri Aug 13, 2013  4:29 PM ------      Message from: Hendricks Limes      Created: Fri Aug 13, 2013  2:59 PM       Venous Doppler ASAP please ------

## 2013-08-13 NOTE — Telephone Encounter (Signed)
Spoke with Jan at [facility] who will process referral today.

## 2013-08-16 ENCOUNTER — Encounter: Payer: Self-pay | Admitting: *Deleted

## 2013-09-28 ENCOUNTER — Other Ambulatory Visit: Payer: Self-pay | Admitting: Internal Medicine

## 2013-09-30 ENCOUNTER — Other Ambulatory Visit: Payer: Self-pay | Admitting: *Deleted

## 2013-09-30 MED ORDER — PRAVASTATIN SODIUM 20 MG PO TABS: ORAL_TABLET | ORAL | Status: DC

## 2013-09-30 MED ORDER — FUROSEMIDE 40 MG PO TABS: ORAL_TABLET | ORAL | Status: DC

## 2013-11-22 ENCOUNTER — Encounter: Payer: Self-pay | Admitting: Internal Medicine

## 2013-11-22 ENCOUNTER — Telehealth: Payer: Self-pay | Admitting: Internal Medicine

## 2013-11-22 ENCOUNTER — Ambulatory Visit (INDEPENDENT_AMBULATORY_CARE_PROVIDER_SITE_OTHER): Payer: Medicare PPO | Admitting: Internal Medicine

## 2013-11-22 VITALS — BP 136/70 | HR 70 | Temp 98.0°F | Resp 15 | Ht 66.25 in | Wt 255.4 lb

## 2013-11-22 DIAGNOSIS — E785 Hyperlipidemia, unspecified: Secondary | ICD-10-CM

## 2013-11-22 DIAGNOSIS — E8881 Metabolic syndrome: Secondary | ICD-10-CM

## 2013-11-22 DIAGNOSIS — D649 Anemia, unspecified: Secondary | ICD-10-CM

## 2013-11-22 DIAGNOSIS — I10 Essential (primary) hypertension: Secondary | ICD-10-CM

## 2013-11-22 NOTE — Assessment & Plan Note (Signed)
CBC & dif 

## 2013-11-22 NOTE — Progress Notes (Signed)
Subjective:    Patient ID: [name], female    DOB: [birth year], 79 y.o.   MRN: [id number]  HPI She  is here to assess active health issues & conditions. PMH, FH, & Social history verified & updated   A heart healthy diet is not  followed; no exercise program. Family history is negative for premature coronary disease. Advanced cholesterol testing reveals  LDL goal is less than 120; ideally < 90 . There is medication compliance with the statin.  Low dose ASA taken BP @ home : not monitored  Review of Systems Specifically denied are  chest pain, palpitations, or claudication. Chronic DOE for years, unchanges Significant abdominal symptoms, memory deficit, or myalgias not present.She has recurrent positional bilateral  LS area pain post sitting which she attributes to statin She denies epistaxis, hemoptysis, hematuria, melena, or rectal bleeding.has no unexplained weight loss, dysphagia, or abdominal pain. No persistently small caliber stools. She has no abnormal bruising or bleeding.  She has no difficulty stopping bleeding with injury.        Objective:   Physical Exam   Gen.:Morbidly obese in appearance. Alert, appropriate and cooperative throughout exam. Head: Normocephalic without obvious abnormalities  Eyes: No corneal or conjunctival inflammation noted. Pupils equal round reactive to light and accommodation. Extraocular motion intact. No icterus  Nose: External nasal exam reveals no deformity or inflammation. Nasal mucosa are pink and moist. No lesions or exudates noted.   Mouth: Oral mucosa and oropharynx reveal no lesions or exudates. Upper plate & lower partial  Neck: No deformities, masses, or tenderness noted. Range of motion &Thyroid normal. Lungs: Normal respiratory effort; chest expands symmetrically. Lungs are clear to auscultation without rales, wheezes, or increased work of breathing. Heart: Normal rate and rhythm. Normal S1 and S2. No gallop, click, or rub. S4 w/o  murmur. Abdomen: Protuberant.Bowel sounds normal; abdomen soft and nontender. No masses, organomegaly or hernias noted. Genitalia:as per Gyn                                  Musculoskeletal/extremities: Accentuated curvature of upper thoracic spine. No clubbing, cyanosis, edema, or significant extremity  deformity noted. Range of motion decreased @ knees .Tone & strength normal. Hand joints normal.  Fingernail health good. Discolored ,thick toenails Able to lie down & sit up w/o help. Negative SLR bilaterally Vascular: Carotid, radial artery, dorsalis pedis and  posterior tibial pulses are equal. Decreased DPP.No bruits present. Neurologic: Alert and oriented x3. Deep tendon reflexes symmetrical but 1/2+      Skin: Intact without suspicious lesions or rashes. Lymph: No cervical, axillary lymphadenopathy present. Psych: Mood and affect are normal. Normally interactive                                                                                        Assessment & Plan:  See Current Assessment & Plan in Problem List under specific DiagnosisThe labs will be reviewed and risks and options assessed. Written recommendations will be provided by mail or directly through My Chart.Further evaluation or change in medical therapy will be  directed by those results.

## 2013-11-22 NOTE — Patient Instructions (Signed)

## 2013-11-22 NOTE — Assessment & Plan Note (Addendum)
Lipids, LFTs, TSH ,CK 

## 2013-11-22 NOTE — Progress Notes (Signed)
Pre visit review using our clinic review tool, if applicable. No additional management support is needed unless otherwise documented below in the visit note. 

## 2013-11-22 NOTE — Assessment & Plan Note (Signed)
Blood pressure goals reviewed. BMET 

## 2013-11-22 NOTE — Telephone Encounter (Signed)
Relevant patient education mailed to patient.  

## 2013-11-22 NOTE — Assessment & Plan Note (Signed)
A1c

## 2013-11-23 ENCOUNTER — Other Ambulatory Visit (INDEPENDENT_AMBULATORY_CARE_PROVIDER_SITE_OTHER): Payer: Medicare PPO

## 2013-11-23 DIAGNOSIS — E785 Hyperlipidemia, unspecified: Secondary | ICD-10-CM

## 2013-11-23 DIAGNOSIS — E8881 Metabolic syndrome: Secondary | ICD-10-CM

## 2013-11-23 DIAGNOSIS — D649 Anemia, unspecified: Secondary | ICD-10-CM

## 2013-11-23 DIAGNOSIS — I10 Essential (primary) hypertension: Secondary | ICD-10-CM

## 2013-11-23 LAB — BASIC METABOLIC PANEL
BUN: 19 mg/dL (ref 6–23)
CHLORIDE: 104 meq/L (ref 96–112)
CO2: 27 mEq/L (ref 19–32)
CREATININE: 1.1 mg/dL (ref 0.4–1.2)
Calcium: 9.3 mg/dL (ref 8.4–10.5)
GFR: 60 mL/min — ABNORMAL LOW (ref >60.00)
GLUCOSE: 147 mg/dL — AB (ref 70–99)
POTASSIUM: 3.7 meq/L (ref 3.5–5.1)
Sodium: 139 mEq/L (ref 135–145)

## 2013-11-23 LAB — TSH: TSH: 1.72 u[IU]/mL (ref 0.35–4.50)

## 2013-11-23 LAB — CBC WITH DIFFERENTIAL/PLATELET
Basophils Absolute: 0 K/uL (ref 0.0–0.1)
Basophils Relative: 0.3 % (ref 0.0–3.0)
Eosinophils Absolute: 0.2 K/uL (ref 0.0–0.7)
Eosinophils Relative: 2.6 % (ref 0.0–5.0)
HCT: 34.5 % — ABNORMAL LOW (ref 36.0–46.0)
Hemoglobin: 11.5 g/dL — ABNORMAL LOW (ref 12.0–15.0)
Lymphocytes Relative: 18.2 % (ref 12.0–46.0)
Lymphs Abs: 1.5 K/uL (ref 0.7–4.0)
MCHC: 33.4 g/dL (ref 30.0–36.0)
MCV: 93.4 fl (ref 78.0–100.0)
Monocytes Absolute: 0.5 K/uL (ref 0.1–1.0)
Monocytes Relative: 6.5 % (ref 3.0–12.0)
Neutro Abs: 5.8 K/uL (ref 1.4–7.7)
Neutrophils Relative %: 72.4 % (ref 43.0–77.0)
Platelets: 289 K/uL (ref 150.0–400.0)
RBC: 3.69 Mil/uL — ABNORMAL LOW (ref 3.87–5.11)
RDW: 12.2 % (ref 11.5–15.5)
WBC: 8 K/uL (ref 4.0–10.5)

## 2013-11-23 LAB — CK: Total CK: 96 U/L (ref 7–177)

## 2013-11-23 LAB — LIPID PANEL
Cholesterol: 150 mg/dL (ref 0–200)
HDL: 33.7 mg/dL — ABNORMAL LOW
LDL Cholesterol: 101 mg/dL — ABNORMAL HIGH (ref 0–99)
Total CHOL/HDL Ratio: 4
Triglycerides: 78 mg/dL (ref 0.0–149.0)
VLDL: 15.6 mg/dL (ref 0.0–40.0)

## 2013-11-23 LAB — HEPATIC FUNCTION PANEL
ALT: 12 U/L (ref 0–35)
AST: 19 U/L (ref 0–37)
Albumin: 3.6 g/dL (ref 3.5–5.2)
Alkaline Phosphatase: 104 U/L (ref 39–117)
Bilirubin, Direct: 0.1 mg/dL (ref 0.0–0.3)
Total Bilirubin: 1.2 mg/dL (ref 0.2–1.2)
Total Protein: 7.1 g/dL (ref 6.0–8.3)

## 2013-11-23 LAB — HEMOGLOBIN A1C: Hgb A1c MFr Bld: 6.1 % (ref 4.6–6.5)

## 2013-11-25 ENCOUNTER — Other Ambulatory Visit: Payer: Self-pay | Admitting: Internal Medicine

## 2013-11-26 ENCOUNTER — Telehealth: Payer: Self-pay | Admitting: *Deleted

## 2013-11-26 ENCOUNTER — Encounter: Payer: Self-pay | Admitting: *Deleted

## 2013-11-26 NOTE — Telephone Encounter (Signed)
Done both has been mailed...[name]

## 2013-11-26 NOTE — Progress Notes (Signed)
Printed letter & mailed along with stool card to patient per Dr. Linna Darner...[name]

## 2013-11-26 NOTE — Telephone Encounter (Signed)
Message copied by Earnstine Regal on Fri Nov 26, 2013  2:09 PM ------      Message from: Hendricks Limes      Created: Fri Nov 26, 2013  6:23 AM       Please send stool cards with lab results. Thanks ------

## 2014-01-27 ENCOUNTER — Other Ambulatory Visit: Payer: Self-pay

## 2014-01-27 MED ORDER — LORAZEPAM 1 MG PO TABS: ORAL_TABLET | ORAL | Status: DC

## 2014-01-27 NOTE — Telephone Encounter (Signed)
OK X1 

## 2014-02-11 ENCOUNTER — Other Ambulatory Visit: Payer: Self-pay | Admitting: Internal Medicine

## 2014-02-11 DIAGNOSIS — Z1231 Encounter for screening mammogram for malignant neoplasm of breast: Secondary | ICD-10-CM

## 2014-02-24 ENCOUNTER — Ambulatory Visit (HOSPITAL_COMMUNITY)
Admission: RE | Admit: 2014-02-24 | Discharge: 2014-02-24 | Disposition: A | Discharge status: Home / Self Care | Payer: Medicare PPO | Source: Ambulatory Visit | Attending: Internal Medicine | Admitting: Internal Medicine

## 2014-02-24 DIAGNOSIS — Z1231 Encounter for screening mammogram for malignant neoplasm of breast: Secondary | ICD-10-CM | POA: Insufficient documentation

## 2014-04-06 ENCOUNTER — Ambulatory Visit: Payer: Medicare PPO

## 2014-04-12 ENCOUNTER — Ambulatory Visit (INDEPENDENT_AMBULATORY_CARE_PROVIDER_SITE_OTHER): Payer: Medicare PPO | Admitting: *Deleted

## 2014-04-12 DIAGNOSIS — Z23 Encounter for immunization: Secondary | ICD-10-CM

## 2014-04-13 ENCOUNTER — Telehealth: Payer: Self-pay | Admitting: Internal Medicine

## 2014-04-13 MED ORDER — FUROSEMIDE 40 MG PO TABS: ORAL_TABLET | ORAL | Status: DC

## 2014-04-13 NOTE — Telephone Encounter (Signed)
Lasix electronically sent to [facility] on [address]

## 2014-04-13 NOTE — Telephone Encounter (Signed)
Patient needs refill on lasix.  She has no more left.  Patient uses [facility] on [address].

## 2014-04-15 ENCOUNTER — Encounter: Payer: Medicare PPO | Admitting: Internal Medicine

## 2014-05-24 ENCOUNTER — Other Ambulatory Visit: Payer: Self-pay | Admitting: Internal Medicine

## 2014-07-11 ENCOUNTER — Other Ambulatory Visit: Payer: Self-pay | Admitting: Internal Medicine

## 2014-07-11 ENCOUNTER — Telehealth: Payer: Self-pay | Admitting: Internal Medicine

## 2014-07-11 MED ORDER — FUROSEMIDE 40 MG PO TABS: ORAL_TABLET | ORAL | Status: DC

## 2014-07-11 NOTE — Telephone Encounter (Signed)
Pt request refill for Furosemide to be send in to [facility] on [address]. Please advise, pt is out of this med

## 2014-10-23 ENCOUNTER — Other Ambulatory Visit: Payer: Self-pay | Admitting: Internal Medicine

## 2014-11-23 ENCOUNTER — Other Ambulatory Visit: Payer: Self-pay | Admitting: Internal Medicine

## 2014-11-23 NOTE — Telephone Encounter (Signed)
OK X 3 mos 

## 2014-12-16 ENCOUNTER — Other Ambulatory Visit: Payer: Self-pay

## 2014-12-16 MED ORDER — PRAVASTATIN SODIUM 20 MG PO TABS: ORAL_TABLET | ORAL | Status: DC

## 2014-12-20 DIAGNOSIS — H04123 Dry eye syndrome of bilateral lacrimal glands: Secondary | ICD-10-CM | POA: Diagnosis not present

## 2014-12-20 DIAGNOSIS — H26493 Other secondary cataract, bilateral: Secondary | ICD-10-CM | POA: Diagnosis not present

## 2015-01-05 ENCOUNTER — Telehealth: Payer: Self-pay | Admitting: Internal Medicine

## 2015-01-05 ENCOUNTER — Other Ambulatory Visit: Payer: Self-pay | Admitting: Emergency Medicine

## 2015-01-05 MED ORDER — FUROSEMIDE 40 MG PO TABS: ORAL_TABLET | ORAL | Status: DC

## 2015-01-05 NOTE — Telephone Encounter (Signed)
Patient needs furosemide sent to [facility] [phone] on [address] in [place].

## 2015-01-05 NOTE — Telephone Encounter (Signed)
#  30 only OV required for refills

## 2015-01-05 NOTE — Telephone Encounter (Signed)
Refill for Lasix sent to pharm. Pt will need office visit for further refills

## 2015-01-05 NOTE — Telephone Encounter (Signed)
Pt hasnt been seen since 05/15. Please advise

## 2015-01-31 ENCOUNTER — Ambulatory Visit (INDEPENDENT_AMBULATORY_CARE_PROVIDER_SITE_OTHER): Payer: Medicare PPO | Admitting: Internal Medicine

## 2015-01-31 ENCOUNTER — Other Ambulatory Visit (INDEPENDENT_AMBULATORY_CARE_PROVIDER_SITE_OTHER): Payer: TRICARE For Life (TFL)

## 2015-01-31 ENCOUNTER — Encounter: Payer: Self-pay | Admitting: Internal Medicine

## 2015-01-31 VITALS — BP 126/68 | HR 64 | Temp 97.9°F | Resp 18 | Ht 66.0 in | Wt 248.0 lb

## 2015-01-31 DIAGNOSIS — E785 Hyperlipidemia, unspecified: Secondary | ICD-10-CM | POA: Diagnosis not present

## 2015-01-31 DIAGNOSIS — J4520 Mild intermittent asthma, uncomplicated: Secondary | ICD-10-CM | POA: Diagnosis not present

## 2015-01-31 DIAGNOSIS — I10 Essential (primary) hypertension: Secondary | ICD-10-CM | POA: Diagnosis not present

## 2015-01-31 DIAGNOSIS — Z23 Encounter for immunization: Secondary | ICD-10-CM

## 2015-01-31 DIAGNOSIS — E8881 Metabolic syndrome: Secondary | ICD-10-CM | POA: Diagnosis not present

## 2015-01-31 DIAGNOSIS — D649 Anemia, unspecified: Secondary | ICD-10-CM

## 2015-01-31 LAB — CBC WITH DIFFERENTIAL/PLATELET
BASOS ABS: 0 10*3/uL (ref 0.0–0.1)
Basophils Relative: 0.4 % (ref 0.0–3.0)
EOS PCT: 1.9 % (ref 0.0–5.0)
Eosinophils Absolute: 0.2 10*3/uL (ref 0.0–0.7)
HEMATOCRIT: 34.6 % — AB (ref 36.0–46.0)
Hemoglobin: 11.4 g/dL — ABNORMAL LOW (ref 12.0–15.0)
Lymphocytes Relative: 16.8 % (ref 12.0–46.0)
Lymphs Abs: 1.3 10*3/uL (ref 0.7–4.0)
MCHC: 33 g/dL (ref 30.0–36.0)
MCV: 94.1 fl (ref 78.0–100.0)
MONO ABS: 0.6 10*3/uL (ref 0.1–1.0)
MONOS PCT: 7.1 % (ref 3.0–12.0)
NEUTROS PCT: 73.8 % (ref 43.0–77.0)
Neutro Abs: 5.9 10*3/uL (ref 1.4–7.7)
Platelets: 300 10*3/uL (ref 150.0–400.0)
RBC: 3.67 Mil/uL — ABNORMAL LOW (ref 3.87–5.11)
RDW: 11.8 % (ref 11.5–15.5)
WBC: 8 10*3/uL (ref 4.0–10.5)

## 2015-01-31 LAB — HEPATIC FUNCTION PANEL
ALT: 8 U/L (ref 0–35)
AST: 11 U/L (ref 0–37)
Albumin: 4 g/dL (ref 3.5–5.2)
Alkaline Phosphatase: 125 U/L — ABNORMAL HIGH (ref 39–117)
BILIRUBIN DIRECT: 0.2 mg/dL (ref 0.0–0.3)
Total Bilirubin: 0.8 mg/dL (ref 0.2–1.2)
Total Protein: 7.4 g/dL (ref 6.0–8.3)

## 2015-01-31 LAB — BASIC METABOLIC PANEL
BUN: 27 mg/dL — AB (ref 6–23)
CO2: 28 mEq/L (ref 19–32)
CREATININE: 1.26 mg/dL — AB (ref 0.40–1.20)
Calcium: 9.6 mg/dL (ref 8.4–10.5)
Chloride: 105 mEq/L (ref 96–112)
GFR: 52.75 mL/min — ABNORMAL LOW (ref >60.00)
Glucose, Bld: 153 mg/dL — ABNORMAL HIGH (ref 70–99)
POTASSIUM: 4 meq/L (ref 3.5–5.1)
Sodium: 139 mEq/L (ref 135–145)

## 2015-01-31 LAB — TSH: TSH: 2.01 u[IU]/mL (ref 0.35–4.50)

## 2015-01-31 LAB — LIPID PANEL
CHOL/HDL RATIO: 5
Cholesterol: 171 mg/dL (ref 0–200)
HDL: 31.4 mg/dL — ABNORMAL LOW (ref >39.00)
LDL Cholesterol: 123 mg/dL — ABNORMAL HIGH (ref 0–99)
NonHDL: 139.6
TRIGLYCERIDES: 84 mg/dL (ref 0.0–149.0)
VLDL: 16.8 mg/dL (ref 0.0–40.0)

## 2015-01-31 LAB — CK: CK TOTAL: 77 U/L (ref 7–177)

## 2015-01-31 LAB — HEMOGLOBIN A1C: HEMOGLOBIN A1C: 6.2 % (ref 4.6–6.5)

## 2015-01-31 NOTE — Assessment & Plan Note (Signed)
Albuterol recommended 15 minutes prior to exercise

## 2015-01-31 NOTE — Patient Instructions (Addendum)
Minimal Blood Pressure Goal= AVERAGE < 140/90;  Ideal is an AVERAGE < 135/85. This AVERAGE should be calculated from @ least 5-7 BP readings taken @ different times of day on different days of week. You should not respond to isolated BP readings , but rather the AVERAGE for that week .Please bring your  blood pressure cuff to office visits to verify that it is reliable.It  can also be checked against the blood pressure device at the pharmacy. Finger or wrist cuffs are not dependable; an arm cuff is.   Albuterol is a rescue inhaler which should be used as infrequently as possible; it should never be used more than 1-2 puffs every 4 hours. It may  be used 15-30 minutes before exercise if that is also  a trigger for your asthma.   If it is required more than 2-3 times per week other than pre exercise; the asthma is not well controlled.  . Colonoscopy due 2017.  Your next office appointment will be determined based upon review of your pending labs .  Those written interpretation of the lab results and instructions will be transmitted to you by mail for your records.  Critical results will be called.   Followup as needed for any active or acute issue. Please report any significant change in your symptoms.

## 2015-01-31 NOTE — Assessment & Plan Note (Signed)
Lipids, LFTs, TSH ,CK 

## 2015-01-31 NOTE — Assessment & Plan Note (Signed)
Blood pressure goals reviewed. BMET 

## 2015-01-31 NOTE — Assessment & Plan Note (Signed)
A1c

## 2015-01-31 NOTE — Assessment & Plan Note (Signed)
CBC

## 2015-01-31 NOTE — Progress Notes (Signed)
   Subjective:    Patient ID: [name], female    DOB: 16-Jun-1935, 80 y.o.   MRN: [id number]  HPI The patient is here to assess status of active health conditions.  PMH, FH, & Social History reviewed & updated.No change in [place] as recorded.  She has been compliant with her medicines without adverse effects. She is not monitoring her blood pressure.  She describes a modified heart healthy diet with decreased fried foods, red meat, and sodium. She doesn't exercise. She states when she walks she gets short of breath after 15-20 feet. She does have asthma and has not been using her albuterol prior to exercise. In fact she rarely uses the albuterol. She's also limited her walking due to pain in her ankles.  Colonoscopy was negative in 2007; she would be due in 2017. She has no active GI symptoms      Review of Systems  Chest pain, palpitations, tachycardia,  paroxysmal nocturnal dyspnea, claudication or edema are absent. No unexplained weight loss, abdominal pain, significant dyspepsia, dysphagia, melena, rectal bleeding, or persistently small caliber stools. Dysuria, pyuria, hematuria, frequency, nocturia or polyuria are denied. Change in hair, skin, nails denied. No bowel changes of constipation or diarrhea. No intolerance to heat or cold.     Objective:   Physical Exam   Pertinent or positive findings include: She has patchy thinning of the hair. She has an upper plate and lower partial. Breath sounds are decreased. There is accentuated curvature of the upper thoracic spine. She has marked crepitus in the knees. Clubbing of the nails is suggested. She is hyperpigmentation changes of the toenails.  General appearance :adequately nourished; in no distress. BMI : 40.05  Eyes: No conjunctival inflammation or scleral icterus is present.  Oral exam:  Lips and gums are healthy appearing.There is no oropharyngeal erythema or exudate noted.   Heart:  Normal rate and regular rhythm. S1 and  S2 normal without gallop, murmur, click, rub or other extra sounds    Lungs:Chest clear to auscultation; no wheezes, rhonchi,rales ,or rubs present.No increased work of breathing.   Abdomen: bowel sounds normal, soft and non-tender without masses, organomegaly or hernias noted.  No guarding or rebound.   Vascular : all pulses equal ; no bruits present.  Skin:Warm & dry.  Intact without suspicious lesions or rashes ; no tenting   Lymphatic: No lymphadenopathy is noted about the head, neck, axilla.   Neuro: Strength, tone & DTRs normal.        Assessment & Plan:  See Current Assessment & Plan in Problem List under specific Diagnosis

## 2015-01-31 NOTE — Progress Notes (Signed)
Pre visit review using our clinic review tool, if applicable. No additional management support is needed unless otherwise documented below in the visit note. 

## 2015-02-08 ENCOUNTER — Telehealth: Payer: Self-pay | Admitting: Internal Medicine

## 2015-02-08 NOTE — Telephone Encounter (Signed)
Patient was in last week and her new prescriptions were not written due to wanting to wait for labs. She got her letter regarding her labs but her prescriptions were not written. Pharmacy is [facility] on [address] Please advise patient.

## 2015-02-09 ENCOUNTER — Other Ambulatory Visit: Payer: Self-pay | Admitting: Emergency Medicine

## 2015-02-09 MED ORDER — FUROSEMIDE 40 MG PO TABS: ORAL_TABLET | ORAL | Status: DC

## 2015-02-09 MED ORDER — PRAVASTATIN SODIUM 20 MG PO TABS: ORAL_TABLET | ORAL | Status: DC

## 2015-02-09 MED ORDER — LOSARTAN POTASSIUM 100 MG PO TABS: 100.0000 mg | ORAL_TABLET | Freq: Every day | ORAL | Status: DC

## 2015-02-09 NOTE — Telephone Encounter (Signed)
Informed pt that rxes were sent to pharm

## 2015-02-21 ENCOUNTER — Other Ambulatory Visit: Payer: Self-pay | Admitting: Internal Medicine

## 2015-02-21 DIAGNOSIS — Z1231 Encounter for screening mammogram for malignant neoplasm of breast: Secondary | ICD-10-CM

## 2015-03-01 ENCOUNTER — Ambulatory Visit (HOSPITAL_COMMUNITY): Payer: TRICARE For Life (TFL)

## 2015-03-02 ENCOUNTER — Ambulatory Visit (HOSPITAL_COMMUNITY)
Admission: RE | Admit: 2015-03-02 | Discharge: 2015-03-02 | Disposition: A | Discharge status: Home / Self Care | Payer: Medicare PPO | Source: Ambulatory Visit | Attending: Internal Medicine | Admitting: Internal Medicine

## 2015-03-02 DIAGNOSIS — Z1231 Encounter for screening mammogram for malignant neoplasm of breast: Secondary | ICD-10-CM | POA: Insufficient documentation

## 2015-03-06 ENCOUNTER — Other Ambulatory Visit: Payer: Self-pay | Admitting: Emergency Medicine

## 2015-03-06 ENCOUNTER — Other Ambulatory Visit: Payer: Self-pay | Admitting: Internal Medicine

## 2015-03-06 MED ORDER — SERTRALINE HCL 50 MG PO TABS: ORAL_TABLET | ORAL | Status: DC

## 2015-03-06 NOTE — Telephone Encounter (Signed)
Last OV 01/31/15. Last refill 11/24/14. Please advise

## 2015-03-06 NOTE — Telephone Encounter (Signed)
OK X 3 mos with 1 refill

## 2015-05-09 ENCOUNTER — Ambulatory Visit (INDEPENDENT_AMBULATORY_CARE_PROVIDER_SITE_OTHER): Payer: Medicare PPO | Admitting: Internal Medicine

## 2015-05-09 ENCOUNTER — Ambulatory Visit (INDEPENDENT_AMBULATORY_CARE_PROVIDER_SITE_OTHER)
Admission: RE | Admit: 2015-05-09 | Discharge: 2015-05-09 | Disposition: A | Discharge status: Home / Self Care | Payer: Medicare PPO | Source: Ambulatory Visit | Attending: Internal Medicine | Admitting: Internal Medicine

## 2015-05-09 ENCOUNTER — Encounter: Payer: Self-pay | Admitting: Internal Medicine

## 2015-05-09 VITALS — BP 126/72 | HR 57 | Temp 97.9°F | Wt 253.8 lb

## 2015-05-09 DIAGNOSIS — D169 Benign neoplasm of bone and articular cartilage, unspecified: Secondary | ICD-10-CM

## 2015-05-09 DIAGNOSIS — M25572 Pain in left ankle and joints of left foot: Secondary | ICD-10-CM

## 2015-05-09 DIAGNOSIS — M19072 Primary osteoarthritis, left ankle and foot: Secondary | ICD-10-CM | POA: Diagnosis not present

## 2015-05-09 MED ORDER — FUROSEMIDE 40 MG PO TABS: ORAL_TABLET | ORAL | Status: DC

## 2015-05-09 MED ORDER — LORAZEPAM 1 MG PO TABS: ORAL_TABLET | ORAL | Status: DC

## 2015-05-09 NOTE — Progress Notes (Signed)
   Subjective:    Patient ID: [name], female    DOB: 1934/08/20, 80 y.o.   MRN: [id number]  HPI She describes pain along the lateral aspect of left foot for the last 8 weeks. There was no specific injury trigger for this. She's noted a small bump in this area. The pain is intermittent and described as burning up to level X. It does hurt when she wears sandals or most shoes. It is better when barefoot. She's noted pain especially early in the morning or at night when ambulating.  She has no associated constitutional, neuro, or dermatologic signs or symptoms.  Review of Systems She denies fever, chills, sweats, weight loss.  There's been no rash or change in the color or temperature of the skin in the area of the pain  She denies any numbness, tingling, weakness in extremities    Objective:   Physical Exam Pertinent or positive findings include: BMI is 40.98. She is dyspneic with minimal exertion. Clubbing of the fingernails suggested. The toenails are thickened and dark. There is marked crepitus of the knees. There appears to be an osteoma along the lateral aspect of the left foot which is tender to palpation.  General appearance :adequately nourished; in no distress.  Eyes: No conjunctival inflammation or scleral icterus is present.  Heart:  Normal rate and regular rhythm. S1 and S2 normal without gallop, murmur, click, rub or other extra sounds    Lungs:Chest clear to auscultation; no wheezes, rhonchi,rales ,or rubs present.No increased work of breathing.   Vascular : all pulses equal ; no bruits present.  Skin:Warm & dry.  Intact without suspicious lesions or rashes ; no tenting    Lymphatic: No lymphadenopathy is noted about the head, neck, axilla.   Neuro: Strength, tone  normal.      Assessment & Plan:  #1 foot pain, left  #2 possible osteoma; rule out remote fracture  Plan: See orders

## 2015-05-09 NOTE — Patient Instructions (Signed)
  Your next office appointment will be determined based upon review of your pending xrays  Those written interpretation of the lab results and instructions will be transmitted to you by mail for your records.  Critical results will be called.  Followup as needed for any active or acute issue. Please report any significant change in your symptoms.  Use an anti-inflammatory cream such as Aspercreme or Zostrix cream twice a day to the affected area as needed. In lieu of this warm moist compresses or  hot water bottle can be used. Do not apply ice .

## 2015-07-07 ENCOUNTER — Other Ambulatory Visit: Payer: Self-pay | Admitting: Emergency Medicine

## 2015-07-07 MED ORDER — SERTRALINE HCL 50 MG PO TABS: ORAL_TABLET | ORAL | Status: DC

## 2015-07-07 MED ORDER — LORAZEPAM 1 MG PO TABS: ORAL_TABLET | ORAL | Status: DC

## 2015-07-07 NOTE — Telephone Encounter (Signed)
RX faxed to [facility], [place]

## 2015-08-28 ENCOUNTER — Telehealth: Payer: Self-pay | Admitting: *Deleted

## 2015-08-28 MED ORDER — LOSARTAN POTASSIUM 100 MG PO TABS: 100.0000 mg | ORAL_TABLET | Freq: Every day | ORAL | Status: DC

## 2015-08-28 NOTE — Telephone Encounter (Signed)
Receive call pt states she is completly out of her Losartan. Her appt w/Dr Quay Burow is not until 10/13/15. Inform pt can send enough until she see md. Rx sent to [facility] in [place]...Johny Chess

## 2015-09-19 ENCOUNTER — Ambulatory Visit: Payer: Medicare PPO | Admitting: Internal Medicine

## 2015-10-13 ENCOUNTER — Ambulatory Visit (INDEPENDENT_AMBULATORY_CARE_PROVIDER_SITE_OTHER): Payer: Medicare Other | Admitting: Internal Medicine

## 2015-10-13 ENCOUNTER — Other Ambulatory Visit (INDEPENDENT_AMBULATORY_CARE_PROVIDER_SITE_OTHER): Payer: Medicare Other

## 2015-10-13 ENCOUNTER — Encounter: Payer: Self-pay | Admitting: Internal Medicine

## 2015-10-13 ENCOUNTER — Ambulatory Visit (INDEPENDENT_AMBULATORY_CARE_PROVIDER_SITE_OTHER)
Admission: RE | Admit: 2015-10-13 | Discharge: 2015-10-13 | Disposition: A | Discharge status: Home / Self Care | Payer: Medicare Other | Source: Ambulatory Visit | Attending: Internal Medicine | Admitting: Internal Medicine

## 2015-10-13 VITALS — BP 146/76 | HR 60 | Temp 98.0°F | Resp 16 | Wt 252.0 lb

## 2015-10-13 DIAGNOSIS — E785 Hyperlipidemia, unspecified: Secondary | ICD-10-CM

## 2015-10-13 DIAGNOSIS — F418 Other specified anxiety disorders: Secondary | ICD-10-CM

## 2015-10-13 DIAGNOSIS — D649 Anemia, unspecified: Secondary | ICD-10-CM | POA: Diagnosis not present

## 2015-10-13 DIAGNOSIS — M545 Low back pain: Secondary | ICD-10-CM

## 2015-10-13 DIAGNOSIS — F329 Major depressive disorder, single episode, unspecified: Secondary | ICD-10-CM | POA: Insufficient documentation

## 2015-10-13 DIAGNOSIS — M79662 Pain in left lower leg: Secondary | ICD-10-CM

## 2015-10-13 DIAGNOSIS — R7303 Prediabetes: Secondary | ICD-10-CM

## 2015-10-13 DIAGNOSIS — I10 Essential (primary) hypertension: Secondary | ICD-10-CM

## 2015-10-13 DIAGNOSIS — G8929 Other chronic pain: Secondary | ICD-10-CM

## 2015-10-13 DIAGNOSIS — F419 Anxiety disorder, unspecified: Secondary | ICD-10-CM

## 2015-10-13 LAB — CBC WITH DIFFERENTIAL/PLATELET
BASOS ABS: 0 10*3/uL (ref 0.0–0.1)
Basophils Relative: 0.4 % (ref 0.0–3.0)
EOS PCT: 2.7 % (ref 0.0–5.0)
Eosinophils Absolute: 0.2 10*3/uL (ref 0.0–0.7)
HCT: 35 % — ABNORMAL LOW (ref 36.0–46.0)
Hemoglobin: 11.6 g/dL — ABNORMAL LOW (ref 12.0–15.0)
LYMPHS ABS: 1.6 10*3/uL (ref 0.7–4.0)
Lymphocytes Relative: 16.7 % (ref 12.0–46.0)
MCHC: 33.1 g/dL (ref 30.0–36.0)
MCV: 93 fl (ref 78.0–100.0)
MONO ABS: 0.6 10*3/uL (ref 0.1–1.0)
MONOS PCT: 6.8 % (ref 3.0–12.0)
NEUTROS ABS: 6.8 10*3/uL (ref 1.4–7.7)
NEUTROS PCT: 73.4 % (ref 43.0–77.0)
PLATELETS: 320 10*3/uL (ref 150.0–400.0)
RBC: 3.76 Mil/uL — ABNORMAL LOW (ref 3.87–5.11)
RDW: 12.1 % (ref 11.5–15.5)
WBC: 9.3 10*3/uL (ref 4.0–10.5)

## 2015-10-13 LAB — COMPREHENSIVE METABOLIC PANEL
ALT: 7 U/L (ref 0–35)
AST: 12 U/L (ref 0–37)
Albumin: 4.1 g/dL (ref 3.5–5.2)
Alkaline Phosphatase: 118 U/L — ABNORMAL HIGH (ref 39–117)
BUN: 22 mg/dL (ref 6–23)
CO2: 33 mEq/L — ABNORMAL HIGH (ref 19–32)
Calcium: 9.7 mg/dL (ref 8.4–10.5)
Chloride: 102 mEq/L (ref 96–112)
Creatinine, Ser: 1.11 mg/dL (ref 0.40–1.20)
GFR: 60.95 mL/min (ref >60.00)
GLUCOSE: 144 mg/dL — AB (ref 70–99)
POTASSIUM: 4.1 meq/L (ref 3.5–5.1)
SODIUM: 140 meq/L (ref 135–145)
Total Bilirubin: 0.8 mg/dL (ref 0.2–1.2)
Total Protein: 7.5 g/dL (ref 6.0–8.3)

## 2015-10-13 LAB — FERRITIN: Ferritin: 358.1 ng/mL — ABNORMAL HIGH (ref 10.0–291.0)

## 2015-10-13 LAB — LIPID PANEL
CHOL/HDL RATIO: 5
Cholesterol: 159 mg/dL (ref 0–200)
HDL: 33.6 mg/dL — AB (ref >39.00)
LDL CALC: 109 mg/dL — AB (ref 0–99)
NonHDL: 125.02
Triglycerides: 82 mg/dL (ref 0.0–149.0)
VLDL: 16.4 mg/dL (ref 0.0–40.0)

## 2015-10-13 LAB — HEMOGLOBIN A1C: Hgb A1c MFr Bld: 6.2 % (ref 4.6–6.5)

## 2015-10-13 LAB — IRON: IRON: 64 ug/dL (ref 42–145)

## 2015-10-13 LAB — VITAMIN B12: Vitamin B-12: >1500 pg/mL — ABNORMAL HIGH (ref 211–911)

## 2015-10-13 LAB — TSH: TSH: 3.14 u[IU]/mL (ref 0.35–4.50)

## 2015-10-13 MED ORDER — LOSARTAN POTASSIUM 100 MG PO TABS: 100.0000 mg | ORAL_TABLET | Freq: Every day | ORAL | Status: DC

## 2015-10-13 MED ORDER — PRAVASTATIN SODIUM 20 MG PO TABS: ORAL_TABLET | ORAL | Status: DC

## 2015-10-13 MED ORDER — SERTRALINE HCL 50 MG PO TABS: ORAL_TABLET | ORAL | Status: DC

## 2015-10-13 MED ORDER — FUROSEMIDE 40 MG PO TABS: ORAL_TABLET | ORAL | Status: DC

## 2015-10-13 NOTE — Assessment & Plan Note (Signed)
Controlled, stable Continue sertraline, ativan only as needed - keep to a minimum - typically take it 2/mo

## 2015-10-13 NOTE — Assessment & Plan Note (Signed)
Recheck a1c Stressed low sugar diet

## 2015-10-13 NOTE — Patient Instructions (Addendum)
  Test(s) ordered today. Your results will be released to [place] (or called to you) after review, usually within 72hours after test completion. If any changes need to be made, you will be notified at that same time.  No immunizations administered today.   Medications reviewed and updated.  No changes recommended at this time.  Your prescription(s) have been submitted to your pharmacy. Please take as directed and contact our office if you believe you are having problem(s) with the medication(s).   Please followup annually

## 2015-10-13 NOTE — Assessment & Plan Note (Addendum)
History of normochromic normocytic anemia Recheck cbc, iron and b12 levels Had colonoscopy 2007

## 2015-10-13 NOTE — Progress Notes (Signed)
Pre visit review using our clinic review tool, if applicable. No additional management support is needed unless otherwise documented below in the visit note. 

## 2015-10-13 NOTE — Assessment & Plan Note (Signed)
Does not want referred for PT, ortho Continue tylenol and advil - will check cmp

## 2015-10-13 NOTE — Progress Notes (Signed)
Subjective:    Patient ID: [name], female    DOB: [birth year], 81 y.o.   MRN: [id number]  HPI She is here to establish with a new pcp.   She has an occasional pain on the right side of her head.  The day is not daily, but it can occur a couple of times a day.  She denies changes in vision.  The excedrin does not always relieve the pain.    She has chronic back pain.  She has constant back pain.  She has had both hips replaced.  She has done PT in the past, which did not help much.  She has taken tylenol arthritis or advil for the pain.    She pain in the left shin.   She has had that pain for a couple of months.  She denies injury to the leg.    Hyperlipidemia: She is taking her medication daily. She is compliant with a low fat/cholesterol diet. She is not exercising regularly. She denies myalgias.   Hypertension, edema: She is taking her medication daily. She is compliant with a low sodium diet.  Her edema is controlled with lasix.  She denies chest pain, palpitations, shortness of breath at rest and regular headaches. She is not exercising regularly.  She does not monitor her blood pressure at home.    Prediabetes:  She is not able to exercise due to her back pain.  She is not compliant with a low sugar diet.    Depression: She is taking her medication daily as prescribed. She denies any side effects from the medication. She feels her depression and anxiety are well controlled and she is happy with her current dose of medication.   She uses the ativan only as needed, which is not often - maybe a couple of times a month.  GERD:  She has gerd sometimes and takes the zantac as needed.     Medications and allergies reviewed with patient and updated if appropriate.  Patient Active Problem List   Diagnosis Date Noted  . Prediabetes 10/13/2015  . NONSPECIFIC ABNORMAL ELECTROCARDIOGRAM 08/25/2009  . FATIGUE 01/02/2009  . BRADYCARDIA 04/11/2008  . LOW BACK PAIN, CHRONIC 11/20/2007    . Hyperlipidemia 06/23/2007  . Essential hypertension 06/23/2007  . ORAL ULCER 06/23/2007  . PLANTAR FASCIITIS 05/29/2007  . MUSCLE CRAMPS 05/29/2007  . Anemia 05/27/2007  . ALLERGIC RHINITIS 05/27/2007  . Intrinsic asthma 05/27/2007  . CAROTID BRUIT, LEFT 05/27/2007  . METABOLIC SYNDROME X 123456    Current Outpatient Prescriptions on File Prior to Visit  Medication Sig Dispense Refill  . acetaminophen (TYLENOL) 325 MG tablet Take 650 mg by mouth as needed.      . Alum Hydroxide-Mag Carbonate (GAVISCON PO) Take by mouth as needed.      [name] aspirin 81 MG tablet Take 81 mg by mouth daily.      . Aspirin-Acetaminophen-Caffeine (EXCEDRIN PO) Take by mouth as needed.      . furosemide (LASIX) 40 MG tablet take 1 tablet by mouth once daily 90 tablet 1  . Glucosamine-Chondroit-Vit C-Mn (GLUCOSAMINE 1500 COMPLEX PO) Take by mouth daily.      [name] LORazepam (ATIVAN) 1 MG tablet take 1/2 tablet by mouth every 8 hours if needed 30 tablet 0  . losartan (COZAAR) 100 MG tablet Take 1 tablet (100 mg total) by mouth daily. Must keep 10/13/15 appt w/new provider for refills 90 tablet 0  . Multiple Vitamin (MULTIVITAMIN) tablet Take 1  tablet by mouth daily.      . pravastatin (PRAVACHOL) 20 MG tablet take 1 tablet by mouth once daily at bedtime 30 tablet 5  . ranitidine (ZANTAC) 150 MG tablet Take 150 mg by mouth as needed.      . sertraline (ZOLOFT) 50 MG tablet take 1 tablet by mouth once daily DO NOT TAKE WITH TRAMADOL.--- Please establish with new PCP for further refills 90 tablet 0  . VENTOLIN HFA 108 (90 BASE) MCG/ACT inhaler INHALE 2 PUFFS BY MOUTH EVERY 6 HOURS AS NEEDED 18 g 3   No current facility-administered medications on file prior to visit.    Past Medical History  Diagnosis Date  . Hypertension   . Asthma     adult onset  . Allergy   . Hyperlipidemia     LDL goal = < 120 based on NMR Lipoprofile 2005  . Anemia   . Carotid bruit     left  . Metabolic syndrome     Past  Surgical History  Procedure Laterality Date  . Total hip arthroplasty  2005, 2006  . Shoulder surgery  11/21/2008    Dr.Aplington  . Total abdominal hysterectomy w/ bilateral salpingoophorectomy  1983    Dr.Fore, for fibroids with pain  . Cardiac catheterization  2006    negative  . Colonoscopy  06/02/2006    Dr.Kaplan,Next Due date 06/02/2016  . Cataract extraction, bilateral      Dr Bing Plume    Social History   Social History  . Marital Status: Widowed    Spouse Name: N/A  . Number of Children: N/A  . Years of Education: N/A   Social History Main Topics  . Smoking status: Never Smoker   . Smokeless tobacco: None  . Alcohol Use: No  . Drug Use: No  . Sexual Activity: Not Asked   Other Topics Concern  . None   Social History Narrative    Family History  Problem Relation Age of Onset  . Hypertension Father   . Stroke Father     > 45  . Hypertension Mother   . Arthritis Mother   . Hypertension Sister   . Diabetes Paternal Grandmother   . Heart attack Maternal Grandfather     age unknown; ? > 54  . Stomach cancer Maternal Grandmother     Review of Systems  Constitutional: Positive for fatigue (low energy level). Negative for fever, chills and appetite change.  Respiratory: Positive for shortness of breath (with exertion). Negative for cough and wheezing.   Cardiovascular: Negative for chest pain, palpitations and leg swelling.  Gastrointestinal: Negative for nausea and abdominal pain.       Occasional gerd  Musculoskeletal: Positive for back pain and arthralgias. Negative for myalgias.  Neurological: Positive for headaches. Negative for dizziness and light-headedness.       Objective:   Filed Vitals:   10/13/15 0801  BP: 146/76  Pulse: 60  Temp: 98 F (36.7 C)  Resp: 16   Filed Weights   10/13/15 0801  Weight: 252 lb (114.306 kg)   Body mass index is 40.69 kg/(m^2).   Physical Exam Constitutional: Appears well-developed and well-nourished. No  distress.  Neck: Neck supple. No tracheal deviation present. No thyromegaly present.  No carotid bruit. No cervical adenopathy.   Cardiovascular: Normal rate, regular rhythm and normal heart sounds.   No murmur heard.  No edema Pulmonary/Chest: Effort normal and breath sounds normal. No respiratory distress. No wheezes.  Msk: back in lumbar spine  and across lower back with palpation      Assessment & Plan:   Right head pain: Intermittent, mild Will just monitor If worsens will consider imaging  Left shin pain ? Radiculopathy from back Will check xray to rule out bone lesion or other cause of pain  See Problem List for Assessment and Plan of chronic medical problems.   Follow up annually ( advised ideal is every 6 months)

## 2015-10-13 NOTE — Assessment & Plan Note (Signed)
Check lipid panel. Continue pravastatin. 

## 2015-10-13 NOTE — Assessment & Plan Note (Signed)
BP slightly elevated today, but she did not take her medication today Appears to be well controlled in past Continue current medication Check cmp

## 2015-10-18 ENCOUNTER — Encounter: Payer: Self-pay | Admitting: Emergency Medicine

## 2015-12-29 ENCOUNTER — Encounter: Payer: Self-pay | Admitting: Internal Medicine

## 2015-12-29 ENCOUNTER — Ambulatory Visit (INDEPENDENT_AMBULATORY_CARE_PROVIDER_SITE_OTHER): Payer: Medicare Other | Admitting: Internal Medicine

## 2015-12-29 DIAGNOSIS — M25569 Pain in unspecified knee: Secondary | ICD-10-CM

## 2015-12-29 DIAGNOSIS — G8929 Other chronic pain: Secondary | ICD-10-CM | POA: Diagnosis not present

## 2015-12-29 MED ORDER — ACETAMINOPHEN-CODEINE #3 300-30 MG PO TABS: 1.0000 | ORAL_TABLET | Freq: Three times a day (TID) | ORAL | Status: DC | PRN

## 2015-12-29 NOTE — Progress Notes (Signed)
Pre visit review using our clinic review tool, if applicable. No additional management support is needed unless otherwise documented below in the visit note. 

## 2015-12-29 NOTE — Patient Instructions (Signed)
   Medications reviewed and updated.  Changes include starting tylenol #3 for your pain, take as directed.   A referral was ordered for orthopedics.  Please followup in 2 months, sooner if needed

## 2015-12-29 NOTE — Progress Notes (Signed)
Subjective:    Patient ID: [name], female    DOB: Apr 01, 1935, 81 y.o.   MRN: [id number]  HPI She is here for an acute visit.   She has chronic back and knee pain.  The pain in her back is something she can handle with otc pain medications.  Her knee pain has gotten worse and that is really why she is here today.   Her pain has reached a point that she does not want to get up after sitting for a while.  She knows it will hurt.  She has a  tub shower and has difficulty getting in and out of it.  She has difficulty gong up and down a few stairs.  She uses a cane to ambulate.  She is using a lot of advil and aspirin.  She has never had her knees evaluated and knows she needs to do something.    Knee pain:  She has bilateral knee pain, likely from arthritis.  In 2006 she had xrays of both knees and was found to have mild-moderate arthritis.     Medications and allergies reviewed with patient and updated if appropriate.  Patient Active Problem List   Diagnosis Date Noted  . Prediabetes 10/13/2015  . Anxiety and depression 10/13/2015  . NONSPECIFIC ABNORMAL ELECTROCARDIOGRAM 08/25/2009  . FATIGUE 01/02/2009  . BRADYCARDIA 04/11/2008  . LOW BACK PAIN, CHRONIC 11/20/2007  . Hyperlipidemia 06/23/2007  . Essential hypertension 06/23/2007  . ORAL ULCER 06/23/2007  . PLANTAR FASCIITIS 05/29/2007  . MUSCLE CRAMPS 05/29/2007  . Anemia 05/27/2007  . ALLERGIC RHINITIS 05/27/2007  . Intrinsic asthma 05/27/2007  . CAROTID BRUIT, LEFT 05/27/2007  . METABOLIC SYNDROME X 123456    Current Outpatient Prescriptions on File Prior to Visit  Medication Sig Dispense Refill  . acetaminophen (TYLENOL) 325 MG tablet Take 650 mg by mouth as needed.      . Alum Hydroxide-Mag Carbonate (GAVISCON PO) Take by mouth as needed.      [name] aspirin 81 MG tablet Take 81 mg by mouth daily.      . Aspirin-Acetaminophen-Caffeine (EXCEDRIN PO) Take by mouth as needed.      . furosemide (LASIX) 40 MG tablet  take 1 tablet by mouth once daily 90 tablet 3  . Glucosamine-Chondroit-Vit C-Mn (GLUCOSAMINE 1500 COMPLEX PO) Take by mouth daily.      [name] LORazepam (ATIVAN) 1 MG tablet take 1/2 tablet by mouth every 8 hours if needed 30 tablet 0  . losartan (COZAAR) 100 MG tablet Take 1 tablet (100 mg total) by mouth daily. 90 tablet 3  . pravastatin (PRAVACHOL) 20 MG tablet take 1 tablet by mouth once daily at bedtime 90 tablet 3  . ranitidine (ZANTAC) 150 MG tablet Take 150 mg by mouth as needed.      . sertraline (ZOLOFT) 50 MG tablet take 1 tablet by mouth once daily 90 tablet 3  . VENTOLIN HFA 108 (90 BASE) MCG/ACT inhaler INHALE 2 PUFFS BY MOUTH EVERY 6 HOURS AS NEEDED 18 g 3   No current facility-administered medications on file prior to visit.    Past Medical History  Diagnosis Date  . Hypertension   . Asthma     adult onset  . Allergy   . Hyperlipidemia     LDL goal = < 120 based on NMR Lipoprofile 2005  . Anemia   . Carotid bruit     left  . Metabolic syndrome     Past Surgical History  Procedure Laterality Date  . Total hip arthroplasty  2005, 2006  . Shoulder surgery  11/21/2008    Dr.Aplington  . Total abdominal hysterectomy w/ bilateral salpingoophorectomy  1983    Dr.Fore, for fibroids with pain  . Cardiac catheterization  2006    negative  . Colonoscopy  06/02/2006    Dr.Kaplan,Next Due date 06/02/2016  . Cataract extraction, bilateral      Dr Bing Plume    Social History   Social History  . Marital Status: Widowed    Spouse Name: N/A  . Number of Children: N/A  . Years of Education: N/A   Social History Main Topics  . Smoking status: Never Smoker   . Smokeless tobacco: None  . Alcohol Use: No  . Drug Use: No  . Sexual Activity: Not Asked   Other Topics Concern  . None   Social History Narrative    Family History  Problem Relation Age of Onset  . Hypertension Father   . Stroke Father     > 94  . Hypertension Mother   . Arthritis Mother   . Hypertension  Sister   . Diabetes Paternal Grandmother   . Heart attack Maternal Grandfather     age unknown; ? > 67  . Stomach cancer Maternal Grandmother     Review of Systems  Constitutional: Positive for fatigue (from chronic pain). Negative for fever.  Musculoskeletal: Positive for back pain, joint swelling, arthralgias (b/l knee pain) and gait problem (poor balance related to knees).  Skin: Negative for color change.  Neurological: Negative for numbness.  Psychiatric/Behavioral: Negative for dysphoric mood.       Objective:   Filed Vitals:   12/29/15 0936  BP: 152/92  Pulse: 64  Temp: 98.4 F (36.9 C)  Resp: 18   Filed Weights   12/29/15 0936  Weight: 245 lb (111.131 kg)   Body mass index is 39.56 kg/(m^2).   Physical Exam  Constitutional: She appears well-developed and well-nourished. No distress.  Musculoskeletal: She exhibits edema (trace).       Right knee: She exhibits decreased range of motion (slight), swelling (mild) and effusion (mild). She exhibits no ecchymosis, no deformity, no erythema, normal alignment and no bony tenderness. Tenderness (mild throughout knee) found.       Left knee: She exhibits decreased range of motion (mild), swelling (mild) and effusion (mild). She exhibits no ecchymosis, no deformity, no erythema and no bony tenderness. Tenderness (mild) found.  Skin: She is not diaphoretic.          Assessment & Plan:   See Problem List for Assessment and Plan of chronic medical problems.

## 2015-12-30 NOTE — Assessment & Plan Note (Signed)
Likely osteoarthritis Will refer to ortho I worry about all of the advil and tylenol she is taking - asked her to cut down Discussed pain management - will try tylenol #3 - I think she is low risk for abuse.  We discussed the concern for addiction and reviewed possible side effects We can also consider tramadol F/u in 2 months, sooner if needed She will call with any questions or concerns

## 2016-01-18 ENCOUNTER — Encounter: Payer: Self-pay | Admitting: Internal Medicine

## 2016-01-18 ENCOUNTER — Telehealth: Payer: Self-pay | Admitting: Emergency Medicine

## 2016-01-18 NOTE — Telephone Encounter (Signed)
Patient called and wanted to speak to Dr Quay Burow. Wouldn't tell me what she needed. Please give her a call back. Thanks.

## 2016-01-18 NOTE — Telephone Encounter (Signed)
Spoke with pt, Pt needed clarification on ortho referral. Gave pt [facility]'s number to contact them.

## 2016-02-13 ENCOUNTER — Ambulatory Visit: Payer: Medicare Other | Admitting: Internal Medicine

## 2016-02-23 ENCOUNTER — Encounter: Payer: Self-pay | Admitting: Gastroenterology

## 2016-02-28 ENCOUNTER — Other Ambulatory Visit: Payer: Self-pay | Admitting: Internal Medicine

## 2016-02-28 ENCOUNTER — Encounter: Payer: Self-pay | Admitting: Internal Medicine

## 2016-02-28 ENCOUNTER — Ambulatory Visit (INDEPENDENT_AMBULATORY_CARE_PROVIDER_SITE_OTHER): Payer: Medicare Other | Admitting: Internal Medicine

## 2016-02-28 ENCOUNTER — Other Ambulatory Visit (INDEPENDENT_AMBULATORY_CARE_PROVIDER_SITE_OTHER): Payer: Medicare Other

## 2016-02-28 VITALS — BP 174/84 | HR 80 | Temp 98.3°F | Resp 20 | Ht 66.0 in | Wt 246.0 lb

## 2016-02-28 DIAGNOSIS — D649 Anemia, unspecified: Secondary | ICD-10-CM

## 2016-02-28 DIAGNOSIS — M25569 Pain in unspecified knee: Secondary | ICD-10-CM | POA: Diagnosis not present

## 2016-02-28 DIAGNOSIS — F418 Other specified anxiety disorders: Secondary | ICD-10-CM

## 2016-02-28 DIAGNOSIS — G8929 Other chronic pain: Secondary | ICD-10-CM

## 2016-02-28 DIAGNOSIS — R7303 Prediabetes: Secondary | ICD-10-CM

## 2016-02-28 DIAGNOSIS — I10 Essential (primary) hypertension: Secondary | ICD-10-CM | POA: Diagnosis not present

## 2016-02-28 DIAGNOSIS — F419 Anxiety disorder, unspecified: Secondary | ICD-10-CM

## 2016-02-28 DIAGNOSIS — F329 Major depressive disorder, single episode, unspecified: Secondary | ICD-10-CM

## 2016-02-28 DIAGNOSIS — E785 Hyperlipidemia, unspecified: Secondary | ICD-10-CM

## 2016-02-28 LAB — CBC WITH DIFFERENTIAL/PLATELET
BASOS PCT: 0.2 % (ref 0.0–3.0)
Basophils Absolute: 0 10*3/uL (ref 0.0–0.1)
Eosinophils Absolute: 0.1 10*3/uL (ref 0.0–0.7)
Eosinophils Relative: 0.8 % (ref 0.0–5.0)
HCT: 35.7 % — ABNORMAL LOW (ref 36.0–46.0)
Hemoglobin: 11.8 g/dL — ABNORMAL LOW (ref 12.0–15.0)
LYMPHS ABS: 1.6 10*3/uL (ref 0.7–4.0)
Lymphocytes Relative: 14.3 % (ref 12.0–46.0)
MCHC: 33 g/dL (ref 30.0–36.0)
MCV: 93 fl (ref 78.0–100.0)
MONO ABS: 0.9 10*3/uL (ref 0.1–1.0)
Monocytes Relative: 7.8 % (ref 3.0–12.0)
NEUTROS PCT: 76.9 % (ref 43.0–77.0)
Neutro Abs: 8.6 10*3/uL — ABNORMAL HIGH (ref 1.4–7.7)
Platelets: 364 10*3/uL (ref 150.0–400.0)
RBC: 3.83 Mil/uL — ABNORMAL LOW (ref 3.87–5.11)
RDW: 12.7 % (ref 11.5–15.5)
WBC: 11.1 10*3/uL — ABNORMAL HIGH (ref 4.0–10.5)

## 2016-02-28 LAB — COMPREHENSIVE METABOLIC PANEL
ALBUMIN: 4.1 g/dL (ref 3.5–5.2)
ALT: 8 U/L (ref 0–35)
AST: 10 U/L (ref 0–37)
Alkaline Phosphatase: 128 U/L — ABNORMAL HIGH (ref 39–117)
BILIRUBIN TOTAL: 0.8 mg/dL (ref 0.2–1.2)
BUN: 20 mg/dL (ref 6–23)
CALCIUM: 9.7 mg/dL (ref 8.4–10.5)
CHLORIDE: 102 meq/L (ref 96–112)
CO2: 30 meq/L (ref 19–32)
Creatinine, Ser: 1.12 mg/dL (ref 0.40–1.20)
GFR: 60.26 mL/min (ref >60.00)
Glucose, Bld: 131 mg/dL — ABNORMAL HIGH (ref 70–99)
Potassium: 4.1 mEq/L (ref 3.5–5.1)
Sodium: 137 mEq/L (ref 135–145)
Total Protein: 7.6 g/dL (ref 6.0–8.3)

## 2016-02-28 LAB — HEMOGLOBIN A1C: HEMOGLOBIN A1C: 6 % (ref 4.6–6.5)

## 2016-02-28 NOTE — Progress Notes (Signed)
Pre visit review using our clinic review tool, if applicable. No additional management support is needed unless otherwise documented below in the visit note. 

## 2016-02-28 NOTE — Assessment & Plan Note (Signed)
Controlled, stable Continue current dose of medication  

## 2016-02-28 NOTE — Progress Notes (Signed)
Subjective:    Patient ID: [name], female    DOB: 1934/09/30, 81 y.o.   MRN: [id number]  HPI She is here for follow up.  Hypertension: She is taking her medication daily. She is somewhat compliant with a low sodium diet.  She denies chest pain, palpitations, edema, shortness of breath and regular headaches. She is not exercising regularly.  She does not monitor her blood pressure at home.    Prediabetes:  She is compliant with a low sugar/carbohydrate diet.  She is not exercising regularly.  Hyperlipidemia: She is taking her medication daily. She is compliant with a low fat/cholesterol diet. She is not exercising regularly. She denies myalgias.   Depression, anxiety: She is taking her medication daily as prescribed. She denies any side effects from the medication. She feels her depression and anxiety are well controlled and she is happy with her current dose of medication.   Knee pain, arthritis:  I prescribed tylenol #3 for her two months ago and it did not help.  Advil worked just as well - she takes two as needed, not always daily.  She has seen orthopedics and had a shot of cortisone in the left knee.  It worked for about two days.  She later had  An injections in her right knee and it did help.  Orthopedics may consider "gel" injections.    Medications and allergies reviewed with patient and updated if appropriate.  Patient Active Problem List   Diagnosis Date Noted  . Chronic knee pain, bilateral 12/29/2015  . Prediabetes 10/13/2015  . Anxiety and depression 10/13/2015  . NONSPECIFIC ABNORMAL ELECTROCARDIOGRAM 08/25/2009  . FATIGUE 01/02/2009  . BRADYCARDIA 04/11/2008  . LOW BACK PAIN, CHRONIC 11/20/2007  . Hyperlipidemia 06/23/2007  . Essential hypertension 06/23/2007  . PLANTAR FASCIITIS 05/29/2007  . MUSCLE CRAMPS 05/29/2007  . Anemia 05/27/2007  . ALLERGIC RHINITIS 05/27/2007  . Intrinsic asthma 05/27/2007  . CAROTID BRUIT, LEFT 05/27/2007  . METABOLIC SYNDROME  X [id number]    Current Outpatient Prescriptions on File Prior to Visit  Medication Sig Dispense Refill  . acetaminophen-codeine (TYLENOL #3) 300-30 MG tablet Take 1 tablet by mouth every 8 (eight) hours as needed for severe pain. 60 tablet 0  . Alum Hydroxide-Mag Carbonate (GAVISCON PO) Take by mouth as needed.      [name] aspirin 81 MG tablet Take 81 mg by mouth daily.      . Aspirin-Acetaminophen-Caffeine (EXCEDRIN PO) Take by mouth as needed.      . furosemide (LASIX) 40 MG tablet take 1 tablet by mouth once daily 90 tablet 3  . Glucosamine-Chondroit-Vit C-Mn (GLUCOSAMINE 1500 COMPLEX PO) Take by mouth daily.      [name] LORazepam (ATIVAN) 1 MG tablet take 1/2 tablet by mouth every 8 hours if needed 30 tablet 0  . losartan (COZAAR) 100 MG tablet Take 1 tablet (100 mg total) by mouth daily. 90 tablet 3  . pravastatin (PRAVACHOL) 20 MG tablet take 1 tablet by mouth once daily at bedtime 90 tablet 3  . ranitidine (ZANTAC) 150 MG tablet Take 150 mg by mouth as needed.      . sertraline (ZOLOFT) 50 MG tablet take 1 tablet by mouth once daily 90 tablet 3  . VENTOLIN HFA 108 (90 BASE) MCG/ACT inhaler INHALE 2 PUFFS BY MOUTH EVERY 6 HOURS AS NEEDED 18 g 3   No current facility-administered medications on file prior to visit.     Past Medical History:  Diagnosis Date  .  Allergy   . Anemia   . Asthma    adult onset  . Carotid bruit    left  . Hyperlipidemia    LDL goal = < 120 based on NMR Lipoprofile 2005  . Hypertension   . Metabolic syndrome     Past Surgical History:  Procedure Laterality Date  . CARDIAC CATHETERIZATION  2006   negative  . CATARACT EXTRACTION, BILATERAL     Dr Bing Plume  . COLONOSCOPY  06/02/2006   Dr.Kaplan,Next Due date 06/02/2016  . SHOULDER SURGERY  11/21/2008   Dr.Aplington  . TOTAL ABDOMINAL HYSTERECTOMY W/ BILATERAL SALPINGOOPHORECTOMY  1983   Dr.Fore, for fibroids with pain  . TOTAL HIP ARTHROPLASTY  2005, 2006    Social History   Social History  . Marital  status: Widowed    Spouse name: N/A  . Number of children: N/A  . Years of education: N/A   Social History Main Topics  . Smoking status: Never Smoker  . Smokeless tobacco: None  . Alcohol use No  . Drug use: No  . Sexual activity: Not Asked   Other Topics Concern  . None   Social History Narrative  . None    Family History  Problem Relation Age of Onset  . Hypertension Father   . Stroke Father     > 20  . Hypertension Mother   . Arthritis Mother   . Hypertension Sister   . Diabetes Paternal Grandmother   . Heart attack Maternal Grandfather     age unknown; ? > 6  . Stomach cancer Maternal Grandmother     Review of Systems  Constitutional: Negative for fever.  Respiratory: Negative for cough, shortness of breath and wheezing.   Cardiovascular: Negative for chest pain, palpitations and leg swelling.  Musculoskeletal: Positive for arthralgias.  Neurological: Negative for dizziness, light-headedness and headaches.       Objective:   Vitals:   02/28/16 1355  BP: (!) 174/84  Pulse: 80  Resp: 20  Temp: 98.3 F (36.8 C)   Filed Weights   02/28/16 1355  Weight: 246 lb (111.6 kg)   Body mass index is 39.71 kg/m.   Physical Exam   Constitutional: Appears well-developed and well-nourished. No distress.  HENT:  Head: Normocephalic and atraumatic.  Neck: Neck supple. No tracheal deviation present. No thyromegaly present.  Cardiovascular: Normal rate, regular rhythm and normal heart sounds.   No murmur heard. No carotid bruit  Pulmonary/Chest: Effort normal and breath sounds normal. No respiratory distress. No has no wheezes. No rales.  Musculoskeletal: trace edema.  Lymphadenopathy: No cervical adenopathy.  Skin: Skin is warm and dry. Not diaphoretic.  Psychiatric: Normal mood and affect. Behavior is normal.     Assessment & Plan:    See Problem List for Assessment and Plan of chronic medical problems.

## 2016-02-28 NOTE — Assessment & Plan Note (Signed)
Taking advil as needed Has received injections in both knees Following with orthopedics

## 2016-02-28 NOTE — Assessment & Plan Note (Signed)
BP elevated here today Will decrease sodium intake and work on weight loss Monitor at home  Will not make any changes today - she wants to work on lifestyle first

## 2016-02-28 NOTE — Assessment & Plan Note (Signed)
Controlled.  Continue statin. 

## 2016-02-28 NOTE — Patient Instructions (Addendum)
  Test(s) ordered today. Your results will be released to MyChart (or called to you) after review, usually within 72hours after test completion. If any changes need to be made, you will be notified at that same time.  Medications reviewed and updated.  No changes recommended at this time.    Please followup in 6 months   

## 2016-02-28 NOTE — Assessment & Plan Note (Signed)
cbc

## 2016-02-28 NOTE — Assessment & Plan Note (Addendum)
Check a1c Exercise and weight loss recommended

## 2016-02-29 LAB — LYME AB/WESTERN BLOT REFLEX: B burgdorferi Ab IgG+IgM: <0.9 Index (ref <0.90)

## 2016-03-04 ENCOUNTER — Encounter: Payer: Self-pay | Admitting: Gastroenterology

## 2016-04-23 ENCOUNTER — Other Ambulatory Visit: Payer: Self-pay | Admitting: Internal Medicine

## 2016-04-23 DIAGNOSIS — Z1231 Encounter for screening mammogram for malignant neoplasm of breast: Secondary | ICD-10-CM

## 2016-05-14 ENCOUNTER — Ambulatory Visit
Admission: RE | Admit: 2016-05-14 | Discharge: 2016-05-14 | Disposition: A | Discharge status: Home / Self Care | Payer: Medicare Other | Source: Ambulatory Visit | Attending: Internal Medicine | Admitting: Internal Medicine

## 2016-05-14 DIAGNOSIS — Z1231 Encounter for screening mammogram for malignant neoplasm of breast: Secondary | ICD-10-CM

## 2016-06-09 ENCOUNTER — Other Ambulatory Visit: Payer: Self-pay | Admitting: Internal Medicine

## 2016-06-10 NOTE — Telephone Encounter (Signed)
Please advise, not on current med list.  

## 2016-06-11 NOTE — Telephone Encounter (Signed)
Call her - At her last visit she said she was not taking this because it did not work any better than advil.  Ideally if they work the same she should just take the advil for now.

## 2016-06-12 NOTE — Telephone Encounter (Signed)
Patient called in to follow up. Advised of dr burns note- she states that she feels that the tylenol is working better than the advil. She is asking that we call more of this in

## 2016-06-13 NOTE — Telephone Encounter (Signed)
Please advise 

## 2016-06-13 NOTE — Telephone Encounter (Signed)
[place] controlled substance database checked.  Ok to fill medication.  rx printed.   Has f/u in February.

## 2016-06-14 NOTE — Telephone Encounter (Signed)
RX faxed to [facility] 

## 2016-07-19 ENCOUNTER — Other Ambulatory Visit: Payer: Self-pay | Admitting: Internal Medicine

## 2016-07-23 ENCOUNTER — Other Ambulatory Visit: Payer: Self-pay | Admitting: *Deleted

## 2016-07-23 MED ORDER — ALBUTEROL SULFATE HFA 108 (90 BASE) MCG/ACT IN AERS: 2.0000 | INHALATION_SPRAY | Freq: Four times a day (QID) | RESPIRATORY_TRACT | 2 refills | Status: DC | PRN

## 2016-07-24 ENCOUNTER — Ambulatory Visit (INDEPENDENT_AMBULATORY_CARE_PROVIDER_SITE_OTHER): Payer: Medicare Other | Admitting: Internal Medicine

## 2016-07-24 ENCOUNTER — Encounter: Payer: Self-pay | Admitting: Internal Medicine

## 2016-07-24 VITALS — BP 202/112 | HR 82 | Temp 97.5°F | Resp 16 | Wt 249.0 lb

## 2016-07-24 DIAGNOSIS — I10 Essential (primary) hypertension: Secondary | ICD-10-CM | POA: Diagnosis not present

## 2016-07-24 MED ORDER — AMLODIPINE BESYLATE 5 MG PO TABS: 5.0000 mg | ORAL_TABLET | Freq: Every day | ORAL | 3 refills | Status: DC

## 2016-07-24 NOTE — Patient Instructions (Addendum)
Your blood pressure is too high.  Your goal blood pressure is less than 140/90.  Start monitoring your blood pressure at home  Continue the losartan 100 mg daily Continue the furosemide 40 mg daily Start amlodipine 5 mg daily  Decrease the sodium in your diet.  Work on losing weight.     Follow up with me in two weeks.      Hypertension Hypertension, commonly called high blood pressure, is when the force of blood pumping through your arteries is too strong. Your arteries are the blood vessels that carry blood from your heart throughout your body. A blood pressure reading consists of a higher number over a lower number, such as 110/72. The higher number (systolic) is the pressure inside your arteries when your heart pumps. The lower number (diastolic) is the pressure inside your arteries when your heart relaxes. Ideally you want your blood pressure below 120/80. Hypertension forces your heart to work harder to pump blood. Your arteries may become narrow or stiff. Having untreated or uncontrolled hypertension can cause heart attack, stroke, kidney disease, and other problems. What increases the risk? Some risk factors for high blood pressure are controllable. Others are not. Risk factors you cannot control include:  Race. You may be at higher risk if you are African American.  Age. Risk increases with age.  Gender. Men are at higher risk than women before age 90 years. After age 94, women are at higher risk than men. Risk factors you can control include:  Not getting enough exercise or physical activity.  Being overweight.  Getting too much fat, sugar, calories, or salt in your diet.  Drinking too much alcohol. What are the signs or symptoms? Hypertension does not usually cause signs or symptoms. Extremely high blood pressure (hypertensive crisis) may cause headache, anxiety, shortness of breath, and nosebleed. How is this diagnosed? To check if you have hypertension, your  health care provider will measure your blood pressure while you are seated, with your arm held at the level of your heart. It should be measured at least twice using the same arm. Certain conditions can cause a difference in blood pressure between your right and left arms. A blood pressure reading that is higher than normal on one occasion does not mean that you need treatment. If it is not clear whether you have high blood pressure, you may be asked to return on a different day to have your blood pressure checked again. Or, you may be asked to monitor your blood pressure at home for 1 or more weeks. How is this treated? Treating high blood pressure includes making lifestyle changes and possibly taking medicine. Living a healthy lifestyle can help lower high blood pressure. You may need to change some of your habits. Lifestyle changes may include:  Following the DASH diet. This diet is high in fruits, vegetables, and whole grains. It is low in salt, red meat, and added sugars.  Keep your sodium intake below 2,300 mg per day.  Getting at least 30-45 minutes of aerobic exercise at least 4 times per week.  Losing weight if necessary.  Not smoking.  Limiting alcoholic beverages.  Learning ways to reduce stress. Your health care provider may prescribe medicine if lifestyle changes are not enough to get your blood pressure under control, and if one of the following is true:  You are 54-32 years of age and your systolic blood pressure is above 140.  You are 51 years of age or older, and your  systolic blood pressure is above 150.  Your diastolic blood pressure is above 90.  You have diabetes, and your systolic blood pressure is over [id number] or your diastolic blood pressure is over 90.  You have kidney disease and your blood pressure is above 140/90.  You have heart disease and your blood pressure is above 140/90. Your personal target blood pressure may vary depending on your medical conditions,  your age, and other factors. Follow these instructions at home:  Have your blood pressure rechecked as directed by your health care provider.  Take medicines only as directed by your health care provider. Follow the directions carefully. Blood pressure medicines must be taken as prescribed. The medicine does not work as well when you skip doses. Skipping doses also puts you at risk for problems.  Do not smoke.  Monitor your blood pressure at home as directed by your health care provider. Contact a health care provider if:  You think you are having a reaction to medicines taken.  You have recurrent headaches or feel dizzy.  You have swelling in your ankles.  You have trouble with your vision. Get help right away if:  You develop a severe headache or confusion.  You have unusual weakness, numbness, or feel faint.  You have severe chest or abdominal pain.  You vomit repeatedly.  You have trouble breathing. This information is not intended to replace advice given to you by your health care provider. Make sure you discuss any questions you have with your health care provider. Document Released: 07/01/2005 Document Revised: 12/07/2015 Document Reviewed: 04/23/2013 Elsevier Interactive Patient Education  2017 [employer].

## 2016-07-24 NOTE — Progress Notes (Signed)
Pre visit review using our clinic review tool, if applicable. No additional management support is needed unless otherwise documented below in the visit note. 

## 2016-07-24 NOTE — Progress Notes (Signed)
Subjective:    Patient ID: [name], female    DOB: [birth year], 82 y.o.   MRN: [id number]  HPI The patient is here for follow up for elevated blood pressure.  She took her BP yesteday at a drug store because she did not feel well.  She has had headaches, has no energy and just has not felt well.   Her headaches are mild. She did take excedrin last night and it helped.   She has been taking mobic for knee pain since mid December but did not take it last night or this morning, because she was concerned it may have been causing her BP to be elevated.  She does not need to take it if it will cause her BP to be elevated.  Last night she did take an extra lasix.  She has been taking her medications daily.     She is not exercising regularly due to knee pain.  She is not compliant with a low sodium diet.   Medications and allergies reviewed with patient and updated if appropriate.  Patient Active Problem List   Diagnosis Date Noted  . Chronic knee pain, bilateral 12/29/2015  . Prediabetes 10/13/2015  . Anxiety and depression 10/13/2015  . NONSPECIFIC ABNORMAL ELECTROCARDIOGRAM 08/25/2009  . FATIGUE 01/02/2009  . BRADYCARDIA 04/11/2008  . LOW BACK PAIN, CHRONIC 11/20/2007  . Hyperlipidemia 06/23/2007  . Essential hypertension 06/23/2007  . PLANTAR FASCIITIS 05/29/2007  . MUSCLE CRAMPS 05/29/2007  . Anemia 05/27/2007  . ALLERGIC RHINITIS 05/27/2007  . Intrinsic asthma 05/27/2007  . CAROTID BRUIT, LEFT 05/27/2007  . METABOLIC SYNDROME X [id number]    Current Outpatient Prescriptions on File Prior to Visit  Medication Sig Dispense Refill  . acetaminophen-codeine (TYLENOL #3) 300-30 MG tablet take 1 tablet by mouth every 8 hours if needed for severe pain 60 tablet 0  . albuterol (VENTOLIN HFA) 108 (90 Base) MCG/ACT inhaler Inhale 2 puffs into the lungs every 6 (six) hours as needed. 18 g 2  . Alum Hydroxide-Mag Carbonate (GAVISCON PO) Take by mouth as needed.      [name] aspirin 81 MG  tablet Take 81 mg by mouth daily.      . Aspirin-Acetaminophen-Caffeine (EXCEDRIN PO) Take by mouth as needed.      . furosemide (LASIX) 40 MG tablet take 1 tablet by mouth once daily 90 tablet 3  . Glucosamine-Chondroit-Vit C-Mn (GLUCOSAMINE 1500 COMPLEX PO) Take by mouth daily.      [name] LORazepam (ATIVAN) 1 MG tablet take 1/2 tablet by mouth every 8 hours if needed 30 tablet 0  . losartan (COZAAR) 100 MG tablet Take 1 tablet (100 mg total) by mouth daily. 90 tablet 3  . pravastatin (PRAVACHOL) 20 MG tablet take 1 tablet by mouth once daily at bedtime 90 tablet 3  . ranitidine (ZANTAC) 150 MG tablet Take 150 mg by mouth as needed.      . sertraline (ZOLOFT) 50 MG tablet take 1 tablet by mouth once daily 90 tablet 3   No current facility-administered medications on file prior to visit.     Past Medical History:  Diagnosis Date  . Allergy   . Anemia   . Asthma    adult onset  . Carotid bruit    left  . Hyperlipidemia    LDL goal = < 120 based on NMR Lipoprofile 2005  . Hypertension   . Metabolic syndrome     Past Surgical History:  Procedure Laterality Date  .  CARDIAC CATHETERIZATION  2006   negative  . CATARACT EXTRACTION, BILATERAL     Dr Bing Plume  . COLONOSCOPY  06/02/2006   Dr.Kaplan,Next Due date 06/02/2016  . SHOULDER SURGERY  11/21/2008   Dr.Aplington  . TOTAL ABDOMINAL HYSTERECTOMY W/ BILATERAL SALPINGOOPHORECTOMY  1983   Dr.Fore, for fibroids with pain  . TOTAL HIP ARTHROPLASTY  2005, 2006    Social History   Social History  . Marital status: Widowed    Spouse name: N/A  . Number of children: N/A  . Years of education: N/A   Social History Main Topics  . Smoking status: Never Smoker  . Smokeless tobacco: Not on file  . Alcohol use No  . Drug use: No  . Sexual activity: Not on file   Other Topics Concern  . Not on file   Social History Narrative  . No narrative on file    Family History  Problem Relation Age of Onset  . Hypertension Father   .  Stroke Father     > 2  . Hypertension Mother   . Arthritis Mother   . Hypertension Sister   . Diabetes Paternal Grandmother   . Heart attack Maternal Grandfather     age unknown; ? > 22  . Stomach cancer Maternal Grandmother     Review of Systems  Constitutional: Positive for fatigue.  Respiratory: Positive for shortness of breath (chronic, unchanged).   Cardiovascular: Negative for chest pain, palpitations and leg swelling.  Gastrointestinal: Positive for nausea.  Musculoskeletal: Positive for arthralgias.  Neurological: Positive for headaches. Negative for dizziness and light-headedness.       Objective:   Vitals:   07/24/16 1614  BP: (!) 202/112  Pulse: 82  Resp: 16  Temp: 97.5 F (36.4 C)   Wt Readings from Last 3 Encounters:  07/24/16 249 lb (112.9 kg)  02/28/16 246 lb (111.6 kg)  12/29/15 245 lb (111.1 kg)   Body mass index is 40.19 kg/m.   Physical Exam    Constitutional: Appears well-developed and well-nourished. No distress.  HENT:  Head: Normocephalic and atraumatic.  Neck: Neck supple. No tracheal deviation present. No thyromegaly present.  No cervical lymphadenopathy Cardiovascular: Normal rate, regular rhythm and normal heart sounds.   No murmur heard. No carotid bruit .  No edema Pulmonary/Chest: Effort normal and breath sounds normal. No respiratory distress. No has no wheezes. No rales.  Skin: Skin is warm and dry. Not diaphoretic.  Psychiatric: Normal mood and affect. Behavior is normal.      Assessment & Plan:    See Problem List for Assessment and Plan of chronic medical problems.

## 2016-07-24 NOTE — Assessment & Plan Note (Signed)
She understands the seriousness of getting her BP well controlled and is willing to do what it takes BP goal for her is less than 140/90 ideally, but for now will be happy with less than 150/90 Stop meloxicam Continue losartan 100 mg daily Continue lasix 40 mg daily Start amlodipine 5 mg daily - anticipate needing to increase it up to 10 mg daily - discussed possible edema and constipation Stressed low sodium diet and weight loss through decreased portions since unable to exercise Start monitoring BP at home Call next week if BP still elevated - will increase amlodipine Follow up with me in 2 weeks, sooner if needed

## 2016-07-29 ENCOUNTER — Telehealth: Payer: Self-pay | Admitting: Internal Medicine

## 2016-07-29 MED ORDER — NEBIVOLOL HCL 5 MG PO TABS: 5.0000 mg | ORAL_TABLET | Freq: Every day | ORAL | 5 refills | Status: DC

## 2016-07-29 NOTE — Telephone Encounter (Signed)
Pt called in and said that she is stilling problems with her BP.  Her bp today is 193/94.  meds was increased but it is still high.  Can you please call pt when you get a chace?

## 2016-07-29 NOTE — Telephone Encounter (Signed)
Spoke with pt to inform.  

## 2016-07-29 NOTE — Telephone Encounter (Signed)
Increase amlodipine to 10 mg daily. Start two pills daily.   Start bystolic 5 mg daily.   Sent to [facility]

## 2016-07-29 NOTE — Telephone Encounter (Signed)
Please advise 

## 2016-08-07 ENCOUNTER — Encounter: Payer: Self-pay | Admitting: Internal Medicine

## 2016-08-07 ENCOUNTER — Ambulatory Visit (INDEPENDENT_AMBULATORY_CARE_PROVIDER_SITE_OTHER): Payer: Medicare Other | Admitting: Internal Medicine

## 2016-08-07 VITALS — BP 138/72 | HR 82 | Temp 98.5°F | Resp 16 | Wt 240.0 lb

## 2016-08-07 DIAGNOSIS — R0609 Other forms of dyspnea: Secondary | ICD-10-CM

## 2016-08-07 DIAGNOSIS — F418 Other specified anxiety disorders: Secondary | ICD-10-CM

## 2016-08-07 DIAGNOSIS — F329 Major depressive disorder, single episode, unspecified: Secondary | ICD-10-CM

## 2016-08-07 DIAGNOSIS — R5382 Chronic fatigue, unspecified: Secondary | ICD-10-CM | POA: Insufficient documentation

## 2016-08-07 DIAGNOSIS — F419 Anxiety disorder, unspecified: Secondary | ICD-10-CM

## 2016-08-07 DIAGNOSIS — I10 Essential (primary) hypertension: Secondary | ICD-10-CM | POA: Diagnosis not present

## 2016-08-07 MED ORDER — AMLODIPINE BESYLATE 5 MG PO TABS: 10.0000 mg | ORAL_TABLET | Freq: Every day | ORAL | 3 refills | Status: DC

## 2016-08-07 NOTE — Patient Instructions (Signed)
   Medications reviewed and updated.  No changes recommended at this time.   A referral was ordered for for cardiology  Please followup next month as scheduled.

## 2016-08-07 NOTE — Progress Notes (Signed)
Pre visit review using our clinic review tool, if applicable. No additional management support is needed unless otherwise documented below in the visit note. 

## 2016-08-07 NOTE — Progress Notes (Signed)
Subjective:    Patient ID: [name], female    DOB: [birth year], 82 y.o.   MRN: [id number]  HPI The patient is here for follow up.  Hypertension: She is taking her medication daily. She is compliant with a low sodium diet.  She denies chest pain, palpitations, edema, and regular headaches. She is not exercising regularly - she is very sedentary.  She does monitor her blood pressure at home - it varies but overall in the past two weeks has improved:  172/83, 193/94, 135/79, 148/78, 130/67, 132/71, 139/75, 120/71, 151/86, 120/58, 132/86.  Fatigue:  She has no energy.  It has been that way for a while, but it has gotten worse.  She feels SOB with activity.  She denies SOB at rest.  She is frustrated by her lack of energy.  She is very sedentary.  She has chronic back pain and the thought of having to get up from a chair due to the lack of energy and back pain is exhausting.     Medications and allergies reviewed with patient and updated if appropriate.  Patient Active Problem List   Diagnosis Date Noted  . Chronic knee pain, bilateral 12/29/2015  . Prediabetes 10/13/2015  . Anxiety and depression 10/13/2015  . NONSPECIFIC ABNORMAL ELECTROCARDIOGRAM 08/25/2009  . FATIGUE 01/02/2009  . BRADYCARDIA 04/11/2008  . LOW BACK PAIN, CHRONIC 11/20/2007  . Hyperlipidemia 06/23/2007  . Essential hypertension 06/23/2007  . PLANTAR FASCIITIS 05/29/2007  . MUSCLE CRAMPS 05/29/2007  . Anemia 05/27/2007  . ALLERGIC RHINITIS 05/27/2007  . Intrinsic asthma 05/27/2007  . CAROTID BRUIT, LEFT 05/27/2007  . METABOLIC SYNDROME X [id number]    Current Outpatient Prescriptions on File Prior to Visit  Medication Sig Dispense Refill  . acetaminophen-codeine (TYLENOL #3) 300-30 MG tablet take 1 tablet by mouth every 8 hours if needed for severe pain 60 tablet 0  . albuterol (VENTOLIN HFA) 108 (90 Base) MCG/ACT inhaler Inhale 2 puffs into the lungs every 6 (six) hours as needed. 18 g 2  . Alum  Hydroxide-Mag Carbonate (GAVISCON PO) Take by mouth as needed.      [name] amLODipine (NORVASC) 5 MG tablet Take 1 tablet (5 mg total) by mouth daily. 90 tablet 3  . aspirin 81 MG tablet Take 81 mg by mouth daily.      . Aspirin-Acetaminophen-Caffeine (EXCEDRIN PO) Take by mouth as needed.      . furosemide (LASIX) 40 MG tablet take 1 tablet by mouth once daily 90 tablet 3  . Glucosamine-Chondroit-Vit C-Mn (GLUCOSAMINE 1500 COMPLEX PO) Take by mouth daily.      [name] LORazepam (ATIVAN) 1 MG tablet take 1/2 tablet by mouth every 8 hours if needed 30 tablet 0  . losartan (COZAAR) 100 MG tablet Take 1 tablet (100 mg total) by mouth daily. 90 tablet 3  . nebivolol (BYSTOLIC) 5 MG tablet Take 1 tablet (5 mg total) by mouth daily. 30 tablet 5  . pravastatin (PRAVACHOL) 20 MG tablet take 1 tablet by mouth once daily at bedtime 90 tablet 3  . ranitidine (ZANTAC) 150 MG tablet Take 150 mg by mouth as needed.      . sertraline (ZOLOFT) 50 MG tablet take 1 tablet by mouth once daily 90 tablet 3  . VOLTAREN 1 % GEL apply 4 grams to affected area THREE TO FOUR TIMES A DAY  0   No current facility-administered medications on file prior to visit.     Past Medical History:  Diagnosis Date  .  Allergy   . Anemia   . Asthma    adult onset  . Carotid bruit    left  . Hyperlipidemia    LDL goal = < 120 based on NMR Lipoprofile 2005  . Hypertension   . Metabolic syndrome     Past Surgical History:  Procedure Laterality Date  . CARDIAC CATHETERIZATION  2006   negative  . CATARACT EXTRACTION, BILATERAL     Dr Bing Plume  . COLONOSCOPY  06/02/2006   Dr.Kaplan,Next Due date 06/02/2016  . SHOULDER SURGERY  11/21/2008   Dr.Aplington  . TOTAL ABDOMINAL HYSTERECTOMY W/ BILATERAL SALPINGOOPHORECTOMY  1983   Dr.Fore, for fibroids with pain  . TOTAL HIP ARTHROPLASTY  2005, 2006    Social History   Social History  . Marital status: Widowed    Spouse name: N/A  . Number of children: N/A  . Years of education: N/A     Social History Main Topics  . Smoking status: Never Smoker  . Smokeless tobacco: Not on file  . Alcohol use No  . Drug use: No  . Sexual activity: Not on file   Other Topics Concern  . Not on file   Social History Narrative  . No narrative on file    Family History  Problem Relation Age of Onset  . Hypertension Father   . Stroke Father     > 65  . Hypertension Mother   . Arthritis Mother   . Hypertension Sister   . Diabetes Paternal Grandmother   . Heart attack Maternal Grandfather     age unknown; ? > 32  . Stomach cancer Maternal Grandmother     Review of Systems  Constitutional: Positive for fatigue. Negative for fever.       If she sleeps well she feels refreshed  Respiratory: Positive for shortness of breath (with exertion). Negative for apnea, cough and wheezing.        Snores  Cardiovascular: Negative for chest pain, palpitations and leg swelling (hands swell at night).  Musculoskeletal: Positive for arthralgias and back pain (lower back).  Neurological: Positive for headaches (yesterday morning only). Negative for light-headedness.  Psychiatric/Behavioral: Positive for dysphoric mood (controlled) and sleep disturbance (she does not sleep well < 50% of the nights). The patient is nervous/anxious (controlled).        Objective:   Vitals:   08/07/16 1533  BP: 138/72  Pulse: 82  Resp: 16  Temp: 98.5 F (36.9 C)   Wt Readings from Last 3 Encounters:  08/07/16 240 lb (108.9 kg)  07/24/16 249 lb (112.9 kg)  02/28/16 246 lb (111.6 kg)   Body mass index is 38.74 kg/m.   Physical Exam    Constitutional: Appears well-developed and well-nourished. No distress.  HENT:  Head: Normocephalic and atraumatic.  Neck: Neck supple. No tracheal deviation present. No thyromegaly present.  No cervical lymphadenopathy Cardiovascular: Normal rate, regular rhythm and normal heart sounds.   No murmur heard. No carotid bruit .  No edema Pulmonary/Chest: Effort normal  and breath sounds normal. No respiratory distress. No has no wheezes. No rales.  Skin: Skin is warm and dry. Not diaphoretic.  Psychiatric: Normal mood and affect. Behavior is normal.      Assessment & Plan:    See Problem List for Assessment and Plan of chronic medical problems.

## 2016-08-07 NOTE — Assessment & Plan Note (Signed)
Much improved - on average around goal Will continue current medications at current doses She follows up with me next month and we will re-evaluate then  Complete blood work at her next visit Continue to monitor BP

## 2016-08-07 NOTE — Assessment & Plan Note (Signed)
Blood work will be done at next visit - over last year it has been normal Depression is well controlled Sedentary and obese - both likely contributing Sleep is refreshing - unlikely sleep apnea -but she does not always sleep well which is possibly contributing Will refer to cardio to rule out cardiac ischemia

## 2016-08-07 NOTE — Assessment & Plan Note (Addendum)
DOE, none at rest Chronic, but getting worse She is obese and sedentary which is likely contributing, but need to rule out cardiac cause - will refer to cardio Echo normal 2007 Blood work in past normal - will recheck full blood work at next visit

## 2016-08-07 NOTE — Assessment & Plan Note (Signed)
Controlled, stable Continue current dose of medication  

## 2016-09-02 ENCOUNTER — Encounter: Payer: Self-pay | Admitting: Cardiology

## 2016-09-11 ENCOUNTER — Ambulatory Visit: Payer: Medicare Other | Admitting: Internal Medicine

## 2016-09-12 ENCOUNTER — Ambulatory Visit: Payer: Medicare Other | Admitting: Cardiology

## 2016-10-08 ENCOUNTER — Encounter (INDEPENDENT_AMBULATORY_CARE_PROVIDER_SITE_OTHER): Payer: Self-pay

## 2016-10-08 ENCOUNTER — Ambulatory Visit (INDEPENDENT_AMBULATORY_CARE_PROVIDER_SITE_OTHER): Payer: Medicare Other | Admitting: Cardiology

## 2016-10-08 ENCOUNTER — Encounter: Payer: Self-pay | Admitting: Cardiology

## 2016-10-08 VITALS — BP 110/70 | HR 60 | Ht 66.0 in | Wt 242.4 lb

## 2016-10-08 DIAGNOSIS — N189 Chronic kidney disease, unspecified: Secondary | ICD-10-CM | POA: Diagnosis not present

## 2016-10-08 DIAGNOSIS — IMO0001 Reserved for inherently not codable concepts without codable children: Secondary | ICD-10-CM

## 2016-10-08 DIAGNOSIS — I129 Hypertensive chronic kidney disease with stage 1 through stage 4 chronic kidney disease, or unspecified chronic kidney disease: Secondary | ICD-10-CM | POA: Diagnosis not present

## 2016-10-08 DIAGNOSIS — E1122 Type 2 diabetes mellitus with diabetic chronic kidney disease: Secondary | ICD-10-CM | POA: Diagnosis not present

## 2016-10-08 DIAGNOSIS — R0600 Dyspnea, unspecified: Secondary | ICD-10-CM | POA: Diagnosis not present

## 2016-10-08 NOTE — Patient Instructions (Signed)
Medication Instructions:  The current medical regimen is effective;  continue present plan and medications.  Testing/Procedures: Your physician has requested that you have a lexiscan myoview. For further information please visit [email or web address]. Please follow instruction sheet, as given.  Your physician has requested that you have an echocardiogram. Echocardiography is a painless test that uses sound waves to create images of your heart. It provides your doctor with information about the size and shape of your heart and how well your heart's chambers and valves are working. This procedure takes approximately one hour. There are no restrictions for this procedure.  Follow-Up: Follow up as needed after testing.  Thank you for choosing [facility]!!

## 2016-10-08 NOTE — Progress Notes (Signed)
Cardiology Office Note    Date:  10/08/2016   ID:  ELIENAI [name], DOB Dec 17, 1934, MRN [id number]  PCP:  Binnie Rail, MD  Cardiologist:   Candee Furbish, MD     History of Present Illness:  [name] is a 82 y.o. female with diabetes, hypertension, obesity with long-standing history of lack of energy, chronic back pain and shortness of breath with activity. It is severe in intensity, panting with movement.  Doesn't like to stand for a long time. Relieved with rest. No associated symptoms of chest pain. Sister says that it is because she is overweight.  Previous echocardiogram in 2007 was normal prior to hip surgery. Blood work has been unremarkable. Her blood pressure is much improved after medication management by Dr. Billey Gosling.  Never smoked. No FHX of CAD.   No syncope, no bleeding, no fever. No chemical exposures.   Past Medical History:  Diagnosis Date  . Allergy   . Anemia   . Asthma    adult onset  . Carotid bruit    left  . Hyperlipidemia    LDL goal = < 120 based on NMR Lipoprofile 2005  . Hypertension   . Metabolic syndrome     Past Surgical History:  Procedure Laterality Date  . CARDIAC CATHETERIZATION  2006   negative  . CATARACT EXTRACTION, BILATERAL     Dr Bing Plume  . COLONOSCOPY  06/02/2006   Dr.Kaplan,Next Due date 06/02/2016  . SHOULDER SURGERY  11/21/2008   Dr.Aplington  . TOTAL ABDOMINAL HYSTERECTOMY W/ BILATERAL SALPINGOOPHORECTOMY  1983   Dr.Fore, for fibroids with pain  . TOTAL HIP ARTHROPLASTY  2005, 2006    Current Medications: Outpatient Medications Prior to Visit  Medication Sig Dispense Refill  . acetaminophen-codeine (TYLENOL #3) 300-30 MG tablet take 1 tablet by mouth every 8 hours if needed for severe pain 60 tablet 0  . Alum Hydroxide-Mag Carbonate (GAVISCON PO) Take 1 Dose by mouth as directed.     [name] aspirin 81 MG tablet Take 81 mg by mouth daily.      . Aspirin-Acetaminophen-Caffeine (EXCEDRIN PO) Take 1 Dose by mouth as directed.      . furosemide (LASIX) 40 MG tablet take 1 tablet by mouth once daily 90 tablet 3  . Glucosamine-Chondroit-Vit C-Mn (GLUCOSAMINE 1500 COMPLEX PO) Take 1 Dose by mouth daily.     [name] losartan (COZAAR) 100 MG tablet Take 1 tablet (100 mg total) by mouth daily. 90 tablet 3  . pravastatin (PRAVACHOL) 20 MG tablet take 1 tablet by mouth once daily at bedtime 90 tablet 3  . ranitidine (ZANTAC) 150 MG tablet Take 150 mg by mouth 2 (two) times daily as needed for heartburn.     . sertraline (ZOLOFT) 50 MG tablet take 1 tablet by mouth once daily 90 tablet 3  . albuterol (VENTOLIN HFA) 108 (90 Base) MCG/ACT inhaler Inhale 2 puffs into the lungs every 6 (six) hours as needed. (Patient not taking: Reported on 10/08/2016) 18 g 2  . amLODipine (NORVASC) 5 MG tablet Take 2 tablets (10 mg total) by mouth daily. (Patient not taking: Reported on 10/08/2016) 180 tablet 3  . LORazepam (ATIVAN) 1 MG tablet take 1/2 tablet by mouth every 8 hours if needed (Patient not taking: Reported on 10/08/2016) 30 tablet 0  . VOLTAREN 1 % GEL apply 4 grams to affected area THREE TO FOUR TIMES A DAY  0   No facility-administered medications prior to visit.  Allergies:   Latex   Social History   Social History  . Marital status: Widowed    Spouse name: N/A  . Number of children: N/A  . Years of education: N/A   Social History Main Topics  . Smoking status: Never Smoker  . Smokeless tobacco: Never Used  . Alcohol use No  . Drug use: No  . Sexual activity: Not on file   Other Topics Concern  . Not on file   Social History Narrative  . No narrative on file     Family History:  The patient's family history includes Arthritis in her mother; Diabetes in her paternal grandmother; Heart attack in her maternal grandfather; Hypertension in her father, mother, and sister; Stomach cancer in her maternal grandmother; Stroke in her father.   ROS:   Please see the history of present illness.    ROS All other systems reviewed  and are negative.   PHYSICAL EXAM:   VS:  BP 110/70   Pulse 60   Ht 5\' 6"  (1.676 m)   Wt 242 lb 6.4 oz (110 kg)   BMI 39.12 kg/m    GEN: Well nourished, well developed, in no acute distress  HEENT: normal  Neck: no JVD, carotid bruits, or masses Cardiac: RRR; soft systolic murmur LLSB, no rubs, or gallops,no edema  Respiratory:  clear to auscultation bilaterally, normal work of breathing GI: soft, nontender, nondistended, + BS, obese MS: no deformity or atrophy  Skin: warm and dry, no rash Neuro:  Alert and Oriented x 3, Strength and sensation are intact Psych: euthymic mood, full affect  Wt Readings from Last 3 Encounters:  10/08/16 242 lb 6.4 oz (110 kg)  08/07/16 240 lb (108.9 kg)  07/24/16 249 lb (112.9 kg)      Studies/Labs Reviewed:   EKG:  EKG is ordered today. 10/08/16 - normal sinus rhythm 61, isolated Q-wave in lead 3, low voltage, nonspecific T-wave changes. 06/03/12-sinus bradycardia low voltage, nonspecific T-wave changes heart rate in the 59. Personally viewed  Recent Labs: 10/13/2015: TSH 3.14 02/28/2016: ALT 8; BUN 20; Creatinine, Ser 1.12; Hemoglobin 11.8; Platelets 364.0; Potassium 4.1; Sodium 137   Lipid Panel    Component Value Date/Time   CHOL 159 10/13/2015 0849   TRIG 82.0 10/13/2015 0849   TRIG 47 06/27/2006 1047   HDL 33.60 (L) 10/13/2015 0849   CHOLHDL 5 10/13/2015 0849   VLDL 16.4 10/13/2015 0849   LDLCALC 109 (H) 10/13/2015 0849    Additional studies/ records that were reviewed today include:   ECHO 09/20/05: - Left ventricular ejection fraction was estimated to be 75 %.    There were no left ventricular regional wall motion    abnormalities. - Estimated aortic valve area (by Vmax) was 2.28 cm^2. - Peak transpulmonic valve gradient was 7 mmHg. - LV function is hyperdynamic. However, there is no LVOT gradient.    There is no SAM of the mitral valve. There is no significant    AS. IMPRESSIONS - LV function is  hyperdynamic. However, there is no LVOT gradient.    There is no SAM of the mitral valve. There is no significant    AS.    ASSESSMENT:    1. Dyspnea, unspecified type   2. Type 2 DM with CKD and hypertension ([place])   3. Morbid obesity ([place])      PLAN:  In order of problems listed above:  Dyspnea on exertion  - With her diabetes, age, obesity, hypertension this could be  anginal equivalent however she is not having any chest pressure or chest discomfort.  - We will check a pharmacologic stress test for any ischemia.  - We will check an echocardiogram as well. Back in 2007, hyperdynamic function with no evidence of outflow tract obstruction. She has a soft systolic murmur.  Diabetes with hypertension  - Hemoglobin A1c 6.0. Management per Dr. Quay Burow. Medications reviewed.  Essential hypertension  - Medications reviewed. Under much better control recently. List of blood pressures were reviewed from prior office note.      Medication Adjustments/Labs and Tests Ordered: Current medicines are reviewed at length with the patient today.  Concerns regarding medicines are outlined above.  Medication changes, Labs and Tests ordered today are listed in the Patient Instructions below. Patient Instructions  Medication Instructions:  The current medical regimen is effective;  continue present plan and medications.  Testing/Procedures: Your physician has requested that you have a lexiscan myoview. For further information please visit HugeFiesta.[email or web address]. Please follow instruction sheet, as given.  Your physician has requested that you have an echocardiogram. Echocardiography is a painless test that uses sound waves to create images of your heart. It provides your doctor with information about the size and shape of your heart and how well your heart's chambers and valves are working. This procedure takes approximately one hour. There are no restrictions for this  procedure.  Follow-Up: Follow up as needed after testing.  Thank you for choosing [facility]!!        Signed, Candee Furbish, MD  10/08/2016 9:00 AM    [address], [place], [place]  [address] Phone: [phone]; Fax: [phone]

## 2016-10-09 NOTE — Progress Notes (Signed)
Subjective:    Patient ID: [name], female    DOB: 09-12-1934, 82 y.o.   MRN: [id number]  HPI The patient is here for follow up.  Hypertension: She is taking her medication daily. She is compliant with a low sodium diet.  She denies chest pain, palpitations, edema and regular headaches. She is exercising regularly.  She does monitor her blood pressure at home.    Prediabetes:  She is compliant with a low sugar/carbohydrate diet.  She has decreased the soda - she has only had two since she was her last. She is not exercising regularly.  Hyperlipidemia: She is taking her medication daily. She is compliant with a low fat/cholesterol diet. She is not exercising regularly. She denies myalgias.   Depression, anxiety: She is taking her medication daily as prescribed. She denies any side effects from the medication. She feels her depression and anxiety are well controlled and she is happy with her current dose of medication.   DOE: She did see cardiology.  She will have an echo/stress test. She still has shortness of breath with exertion - it has not gotten worse.    GERD:  She is taking her medication daily as prescribed.  She denies any GERD symptoms and feels her GERD is well controlled.    Medications and allergies reviewed with patient and updated if appropriate.  Patient Active Problem List   Diagnosis Date Noted  . Chronic fatigue 08/07/2016  . DOE (dyspnea on exertion) 08/07/2016  . Chronic knee pain, bilateral 12/29/2015  . Prediabetes 10/13/2015  . Anxiety and depression 10/13/2015  . NONSPECIFIC ABNORMAL ELECTROCARDIOGRAM 08/25/2009  . BRADYCARDIA 04/11/2008  . LOW BACK PAIN, CHRONIC 11/20/2007  . Hyperlipidemia 06/23/2007  . Essential hypertension 06/23/2007  . PLANTAR FASCIITIS 05/29/2007  . MUSCLE CRAMPS 05/29/2007  . Anemia 05/27/2007  . ALLERGIC RHINITIS 05/27/2007  . Intrinsic asthma 05/27/2007  . CAROTID BRUIT, LEFT 05/27/2007  . METABOLIC SYNDROME X  00/93/8182    Current Outpatient Prescriptions on File Prior to Visit  Medication Sig Dispense Refill  . acetaminophen-codeine (TYLENOL #3) 300-30 MG tablet take 1 tablet by mouth every 8 hours if needed for severe pain 60 tablet 0  . albuterol (PROVENTIL HFA;VENTOLIN HFA) 108 (90 Base) MCG/ACT inhaler Inhale 2 puffs into the lungs every 6 (six) hours as needed for wheezing or shortness of breath.    . Alum Hydroxide-Mag Carbonate (GAVISCON PO) Take 1 Dose by mouth as directed.     [name] amLODipine (NORVASC) 5 MG tablet Take 5 mg by mouth daily.    [name] aspirin 81 MG tablet Take 81 mg by mouth daily.      . Aspirin-Acetaminophen-Caffeine (EXCEDRIN PO) Take 1 Dose by mouth as directed.     . furosemide (LASIX) 40 MG tablet take 1 tablet by mouth once daily 90 tablet 3  . Glucosamine-Chondroit-Vit C-Mn (GLUCOSAMINE 1500 COMPLEX PO) Take 1 Dose by mouth daily.     [name] LORazepam (ATIVAN) 1 MG tablet Take 0.5 mg by mouth 3 (three) times daily as needed for anxiety.    [name] losartan (COZAAR) 100 MG tablet Take 1 tablet (100 mg total) by mouth daily. 90 tablet 3  . pravastatin (PRAVACHOL) 20 MG tablet take 1 tablet by mouth once daily at bedtime 90 tablet 3  . ranitidine (ZANTAC) 150 MG tablet Take 150 mg by mouth 2 (two) times daily as needed for heartburn.     . sertraline (ZOLOFT) 50 MG tablet take 1 tablet by  mouth once daily 90 tablet 3   No current facility-administered medications on file prior to visit.     Past Medical History:  Diagnosis Date  . Allergy   . Anemia   . Asthma    adult onset  . Carotid bruit    left  . Hyperlipidemia    LDL goal = < 120 based on NMR Lipoprofile 2005  . Hypertension   . Metabolic syndrome     Past Surgical History:  Procedure Laterality Date  . CARDIAC CATHETERIZATION  2006   negative  . CATARACT EXTRACTION, BILATERAL     Dr Bing Plume  . COLONOSCOPY  06/02/2006   Dr.Kaplan,Next Due date 06/02/2016  . SHOULDER SURGERY  11/21/2008   Dr.Aplington  . TOTAL  ABDOMINAL HYSTERECTOMY W/ BILATERAL SALPINGOOPHORECTOMY  1983   Dr.Fore, for fibroids with pain  . TOTAL HIP ARTHROPLASTY  2005, 2006    Social History   Social History  . Marital status: Widowed    Spouse name: N/A  . Number of children: N/A  . Years of education: N/A   Social History Main Topics  . Smoking status: Never Smoker  . Smokeless tobacco: Never Used  . Alcohol use No  . Drug use: No  . Sexual activity: Not on file   Other Topics Concern  . Not on file   Social History Narrative  . No narrative on file    Family History  Problem Relation Age of Onset  . Hypertension Father   . Stroke Father     > 36  . Hypertension Mother   . Arthritis Mother   . Hypertension Sister   . Diabetes Paternal Grandmother   . Heart attack Maternal Grandfather     age unknown; ? > 26  . Stomach cancer Maternal Grandmother     Review of Systems  Constitutional: Negative for chills and fever.  Respiratory: Positive for shortness of breath (with exertion). Negative for cough and wheezing.   Cardiovascular: Negative for chest pain, palpitations and leg swelling.  Neurological: Negative for light-headedness and headaches.       Objective:   Vitals:   10/10/16 0808  BP: 110/60  Pulse: (!) 58  Temp: 97.7 F (36.5 C)   Wt Readings from Last 3 Encounters:  10/10/16 244 lb (110.7 kg)  10/08/16 242 lb 6.4 oz (110 kg)  08/07/16 240 lb (108.9 kg)   Body mass index is 39.38 kg/m.   Physical Exam    Constitutional: Appears well-developed and well-nourished. No distress.  HENT:  Head: Normocephalic and atraumatic.  Neck: Neck supple. No tracheal deviation present. No thyromegaly present.  No cervical lymphadenopathy Cardiovascular: Normal rate, regular rhythm and normal heart sounds.   No murmur heard. No carotid bruit .  No edema Pulmonary/Chest: Effort normal and breath sounds normal. No respiratory distress. No has no wheezes. No rales.  Skin: Skin is warm and dry.  Not diaphoretic.  Psychiatric: Normal mood and affect. Behavior is normal.      Assessment & Plan:    See Problem List for Assessment and Plan of chronic medical problems.

## 2016-10-10 ENCOUNTER — Ambulatory Visit (INDEPENDENT_AMBULATORY_CARE_PROVIDER_SITE_OTHER): Payer: Medicare Other | Admitting: Internal Medicine

## 2016-10-10 ENCOUNTER — Other Ambulatory Visit (INDEPENDENT_AMBULATORY_CARE_PROVIDER_SITE_OTHER): Payer: Medicare Other

## 2016-10-10 ENCOUNTER — Encounter: Payer: Self-pay | Admitting: Internal Medicine

## 2016-10-10 VITALS — BP 110/60 | HR 58 | Temp 97.7°F | Ht 66.0 in | Wt 244.0 lb

## 2016-10-10 DIAGNOSIS — F418 Other specified anxiety disorders: Secondary | ICD-10-CM | POA: Diagnosis not present

## 2016-10-10 DIAGNOSIS — D649 Anemia, unspecified: Secondary | ICD-10-CM

## 2016-10-10 DIAGNOSIS — I10 Essential (primary) hypertension: Secondary | ICD-10-CM

## 2016-10-10 DIAGNOSIS — E7800 Pure hypercholesterolemia, unspecified: Secondary | ICD-10-CM | POA: Diagnosis not present

## 2016-10-10 DIAGNOSIS — G8929 Other chronic pain: Secondary | ICD-10-CM

## 2016-10-10 DIAGNOSIS — F329 Major depressive disorder, single episode, unspecified: Secondary | ICD-10-CM

## 2016-10-10 DIAGNOSIS — M25562 Pain in left knee: Secondary | ICD-10-CM | POA: Diagnosis not present

## 2016-10-10 DIAGNOSIS — R0609 Other forms of dyspnea: Secondary | ICD-10-CM

## 2016-10-10 DIAGNOSIS — F419 Anxiety disorder, unspecified: Secondary | ICD-10-CM

## 2016-10-10 DIAGNOSIS — R7303 Prediabetes: Secondary | ICD-10-CM | POA: Diagnosis not present

## 2016-10-10 DIAGNOSIS — M25561 Pain in right knee: Secondary | ICD-10-CM

## 2016-10-10 LAB — CBC WITH DIFFERENTIAL/PLATELET
Basophils Absolute: 0.1 10*3/uL (ref 0.0–0.1)
Basophils Relative: 0.9 % (ref 0.0–3.0)
EOS PCT: 3.2 % (ref 0.0–5.0)
Eosinophils Absolute: 0.2 10*3/uL (ref 0.0–0.7)
HCT: 31.6 % — ABNORMAL LOW (ref 36.0–46.0)
HEMOGLOBIN: 10.5 g/dL — AB (ref 12.0–15.0)
Lymphocytes Relative: 16.6 % (ref 12.0–46.0)
Lymphs Abs: 1.2 10*3/uL (ref 0.7–4.0)
MCHC: 33.3 g/dL (ref 30.0–36.0)
MCV: 94.7 fl (ref 78.0–100.0)
MONO ABS: 0.5 10*3/uL (ref 0.1–1.0)
Monocytes Relative: 7.1 % (ref 3.0–12.0)
Neutro Abs: 5.3 10*3/uL (ref 1.4–7.7)
Neutrophils Relative %: 72.2 % (ref 43.0–77.0)
Platelets: 287 10*3/uL (ref 150.0–400.0)
RBC: 3.33 Mil/uL — AB (ref 3.87–5.11)
RDW: 12.3 % (ref 11.5–15.5)
WBC: 7.4 10*3/uL (ref 4.0–10.5)

## 2016-10-10 LAB — COMPREHENSIVE METABOLIC PANEL
ALK PHOS: 109 U/L (ref 39–117)
ALT: 7 U/L (ref 0–35)
AST: 10 U/L (ref 0–37)
Albumin: 3.8 g/dL (ref 3.5–5.2)
BUN: 21 mg/dL (ref 6–23)
CALCIUM: 9.3 mg/dL (ref 8.4–10.5)
CO2: 30 mEq/L (ref 19–32)
Chloride: 104 mEq/L (ref 96–112)
Creatinine, Ser: 1.1 mg/dL (ref 0.40–1.20)
GFR: 61.43 mL/min (ref >60.00)
Glucose, Bld: 132 mg/dL — ABNORMAL HIGH (ref 70–99)
POTASSIUM: 3.6 meq/L (ref 3.5–5.1)
Sodium: 140 mEq/L (ref 135–145)
TOTAL PROTEIN: 7.2 g/dL (ref 6.0–8.3)
Total Bilirubin: 0.8 mg/dL (ref 0.2–1.2)

## 2016-10-10 LAB — HEMOGLOBIN A1C: Hgb A1c MFr Bld: 5.7 % (ref 4.6–6.5)

## 2016-10-10 LAB — LIPID PANEL
CHOL/HDL RATIO: 5
Cholesterol: 154 mg/dL (ref 0–200)
HDL: 30.3 mg/dL — AB (ref >39.00)
LDL Cholesterol: 112 mg/dL — ABNORMAL HIGH (ref 0–99)
NONHDL: 123.74
TRIGLYCERIDES: 61 mg/dL (ref 0.0–149.0)
VLDL: 12.2 mg/dL (ref 0.0–40.0)

## 2016-10-10 NOTE — Assessment & Plan Note (Signed)
DOE - no change since last visit Has seen cardio - scheduled for Echo and stress test to rule out cardiac ischemia/HF Weight and deconditioning likely contributing

## 2016-10-10 NOTE — Assessment & Plan Note (Signed)
Check a1c Low sugar / carb diet Stressed regular exercise, weight loss  

## 2016-10-10 NOTE — Assessment & Plan Note (Signed)
BP well controlled Current regimen effective and well tolerated Continue current medications at current doses  

## 2016-10-10 NOTE — Progress Notes (Signed)
Pre visit review using our clinic review tool, if applicable. No additional management support is needed unless otherwise documented below in the visit note. 

## 2016-10-10 NOTE — Patient Instructions (Signed)
  Test(s) ordered today. Your results will be released to MyChart (or called to you) after review, usually within 72hours after test completion. If any changes need to be made, you will be notified at that same time.  Medications reviewed and updated.  No changes recommended at this time.    Please followup in 6 months   

## 2016-10-10 NOTE — Assessment & Plan Note (Signed)
Recheck cbc.  

## 2016-10-10 NOTE — Assessment & Plan Note (Signed)
Controlled, stable Continue current dose of medication  

## 2016-10-10 NOTE — Assessment & Plan Note (Signed)
Check lipid panel  Continue daily statin Regular exercise and healthy diet encouraged  

## 2016-10-10 NOTE — Assessment & Plan Note (Signed)
Ortho prescribed mobic, but it increased her BP and she stopped it Has intermittent injections - right knee was effective, left knee only last one day She has follow up next Monday to discuss other options

## 2016-10-14 ENCOUNTER — Other Ambulatory Visit: Payer: Self-pay | Admitting: Internal Medicine

## 2016-10-21 ENCOUNTER — Ambulatory Visit (HOSPITAL_COMMUNITY): Payer: Medicare Other

## 2016-10-22 ENCOUNTER — Ambulatory Visit (HOSPITAL_COMMUNITY): Payer: Medicare Other

## 2016-10-22 ENCOUNTER — Other Ambulatory Visit (HOSPITAL_COMMUNITY): Payer: Medicare Other

## 2016-10-27 ENCOUNTER — Other Ambulatory Visit: Payer: Self-pay | Admitting: Internal Medicine

## 2016-10-28 NOTE — Telephone Encounter (Signed)
Please advise, not on current med list.  

## 2016-11-12 ENCOUNTER — Other Ambulatory Visit: Payer: Self-pay | Admitting: Internal Medicine

## 2016-11-12 ENCOUNTER — Ambulatory Visit (HOSPITAL_COMMUNITY): Payer: Medicare Other

## 2016-11-13 ENCOUNTER — Ambulatory Visit (HOSPITAL_COMMUNITY): Payer: Medicare Other

## 2016-11-13 ENCOUNTER — Other Ambulatory Visit (HOSPITAL_COMMUNITY): Payer: Medicare Other

## 2016-11-18 ENCOUNTER — Other Ambulatory Visit: Payer: Self-pay | Admitting: Internal Medicine

## 2016-11-27 ENCOUNTER — Telehealth (HOSPITAL_COMMUNITY): Payer: Self-pay | Admitting: *Deleted

## 2016-11-27 NOTE — Telephone Encounter (Signed)
Attempted to call patient regarding upcoming appointment- no answer.  [name]  

## 2016-12-02 ENCOUNTER — Telehealth (HOSPITAL_COMMUNITY): Payer: Self-pay

## 2016-12-03 ENCOUNTER — Other Ambulatory Visit: Payer: Self-pay

## 2016-12-03 ENCOUNTER — Ambulatory Visit (HOSPITAL_COMMUNITY): Payer: Medicare Other | Attending: Cardiovascular Disease

## 2016-12-03 ENCOUNTER — Ambulatory Visit (HOSPITAL_BASED_OUTPATIENT_CLINIC_OR_DEPARTMENT_OTHER): Payer: Medicare Other

## 2016-12-03 DIAGNOSIS — I429 Cardiomyopathy, unspecified: Secondary | ICD-10-CM | POA: Insufficient documentation

## 2016-12-03 DIAGNOSIS — R0989 Other specified symptoms and signs involving the circulatory and respiratory systems: Secondary | ICD-10-CM | POA: Insufficient documentation

## 2016-12-03 DIAGNOSIS — R5382 Chronic fatigue, unspecified: Secondary | ICD-10-CM | POA: Insufficient documentation

## 2016-12-03 DIAGNOSIS — E1122 Type 2 diabetes mellitus with diabetic chronic kidney disease: Secondary | ICD-10-CM

## 2016-12-03 DIAGNOSIS — R0600 Dyspnea, unspecified: Secondary | ICD-10-CM

## 2016-12-03 DIAGNOSIS — R001 Bradycardia, unspecified: Secondary | ICD-10-CM | POA: Insufficient documentation

## 2016-12-03 DIAGNOSIS — IMO0001 Reserved for inherently not codable concepts without codable children: Secondary | ICD-10-CM

## 2016-12-03 DIAGNOSIS — I129 Hypertensive chronic kidney disease with stage 1 through stage 4 chronic kidney disease, or unspecified chronic kidney disease: Secondary | ICD-10-CM | POA: Diagnosis not present

## 2016-12-03 DIAGNOSIS — E785 Hyperlipidemia, unspecified: Secondary | ICD-10-CM | POA: Insufficient documentation

## 2016-12-03 DIAGNOSIS — N189 Chronic kidney disease, unspecified: Secondary | ICD-10-CM | POA: Insufficient documentation

## 2016-12-03 MED ORDER — TECHNETIUM TC 99M TETROFOSMIN IV KIT
32.9000 | PACK | Freq: Once | INTRAVENOUS | Status: AC | PRN
  Administered 2016-12-03: 32.9 via INTRAVENOUS
  Filled 2016-12-03: qty 33

## 2016-12-03 MED ORDER — REGADENOSON 0.4 MG/5ML IV SOLN
0.4000 mg | Freq: Once | INTRAVENOUS | Status: AC
  Administered 2016-12-03: 0.4 mg via INTRAVENOUS

## 2016-12-04 ENCOUNTER — Ambulatory Visit (HOSPITAL_COMMUNITY): Payer: Medicare Other | Attending: Cardiovascular Disease

## 2016-12-04 LAB — MYOCARDIAL PERFUSION IMAGING
CHL CUP NUCLEAR SDS: 5
LVDIAVOL: 114 mL (ref 46–106)
LVSYSVOL: 39 mL
Peak HR: 83 {beats}/min
RATE: 0.3
Rest HR: 56 {beats}/min
SRS: 2
SSS: 7
TID: 1

## 2016-12-04 MED ORDER — TECHNETIUM TC 99M TETROFOSMIN IV KIT
31.2000 | PACK | Freq: Once | INTRAVENOUS | Status: AC | PRN
  Administered 2016-12-04: 31.2 via INTRAVENOUS
  Filled 2016-12-04: qty 32

## 2017-01-24 ENCOUNTER — Other Ambulatory Visit: Payer: Self-pay | Admitting: Internal Medicine

## 2017-04-03 NOTE — Progress Notes (Signed)
Subjective:    Patient ID: [name], female    DOB: Feb 16, 1935, 82 y.o.   MRN: [id number]  HPI The patient is here for follow up.  Lightheadedness:  It started a while ago and it is intermittent.  She has not had it in a couple of weeks.  There was no pattern to it.  It was transient when it came.    Pain in right arm, upper part of arm: she has not had this in a while but wanted to know if she should be concerned about heart disease.  She did not notice any pattern to the pain.   Jaw pain:  It was in the TMJ pain or temple area on the right side.  She has not had it recently.  She was worried about her heart more than anything.  She is unsure if it was worse with eating, chewing or talking.   Muscle pain mid back and lateral aspects of chest:  She has more pain in her middle back and lateral aspects of her thorax.    Lump upper back;  She has had this for years.  Dr Linna Darner said it was a fat ball.  Last year it went away and it is back now.  She denies any pain. It is not increasing in size.    Hypertension: She is taking her medication daily. She is compliant with a low sodium diet.  She denies chest pain, palpitations, edema, and regular headaches. She is not exercising regularly.     Hyperlipidemia: She is taking her medication daily. She is compliant with a low fat/cholesterol diet. She is not exercising regularly. She denies myalgias.   Prediabetes:  She is fairly compliant with a low sugar/carbohydrate diet.  She is not exercising regularly.  Anxiety, Depression: She is taking her medication daily as prescribed. She takes the ativan on occasion only.  She denies any side effects from the medication. She feels her depression and anxiety are well controlled and she is happy with her current dose of medication.   Anemia:  She has had anemia for years.  No cause was identified in the past.   She denies any evidence of bleeding.   Knee pain, back pain:  She takes tylenol or advil  as needed only and it helps.    Medications and allergies reviewed with patient and updated if appropriate.  Patient Active Problem List   Diagnosis Date Noted  . Chronic fatigue 08/07/2016  . DOE (dyspnea on exertion) 08/07/2016  . Chronic knee pain, bilateral 12/29/2015  . Prediabetes 10/13/2015  . Anxiety and depression 10/13/2015  . NONSPECIFIC ABNORMAL ELECTROCARDIOGRAM 08/25/2009  . BRADYCARDIA 04/11/2008  . LOW BACK PAIN, CHRONIC 11/20/2007  . Hyperlipidemia 06/23/2007  . Essential hypertension 06/23/2007  . PLANTAR FASCIITIS 05/29/2007  . MUSCLE CRAMPS 05/29/2007  . Anemia 05/27/2007  . ALLERGIC RHINITIS 05/27/2007  . Intrinsic asthma 05/27/2007  . CAROTID BRUIT, LEFT 05/27/2007  . METABOLIC SYNDROME X 54/62/7035    Current Outpatient Prescriptions on File Prior to Visit  Medication Sig Dispense Refill  . acetaminophen-codeine (TYLENOL #3) 300-30 MG tablet take 1 tablet by mouth every 8 hours if needed for severe pain 60 tablet 0  . albuterol (PROVENTIL HFA;VENTOLIN HFA) 108 (90 Base) MCG/ACT inhaler Inhale 2 puffs into the lungs every 6 (six) hours as needed for wheezing or shortness of breath.    . Alum Hydroxide-Mag Carbonate (GAVISCON PO) Take 1 Dose by mouth as directed.     [name]  amLODipine (NORVASC) 5 MG tablet Take 5 mg by mouth daily.    [name] aspirin 81 MG tablet Take 81 mg by mouth daily.      . Aspirin-Acetaminophen-Caffeine (EXCEDRIN PO) Take 1 Dose by mouth as directed.     . furosemide (LASIX) 40 MG tablet Take 1 tablet (40 mg total) by mouth daily. --- Office visit needed for further refills 90 tablet 0  . Glucosamine-Chondroit-Vit C-Mn (GLUCOSAMINE 1500 COMPLEX PO) Take 1 Dose by mouth daily.     [name] LORazepam (ATIVAN) 1 MG tablet Take 0.5 mg by mouth 3 (three) times daily as needed for anxiety.    [name] losartan (COZAAR) 100 MG tablet take 1 tablet by mouth once daily 90 tablet 1  . pravastatin (PRAVACHOL) 20 MG tablet take 1 tablet by mouth once daily at bedtime 90  tablet 2  . ranitidine (ZANTAC) 150 MG tablet Take 150 mg by mouth 2 (two) times daily as needed for heartburn.     . sertraline (ZOLOFT) 50 MG tablet take 1 tablet by mouth once daily 90 tablet 1   No current facility-administered medications on file prior to visit.     Past Medical History:  Diagnosis Date  . Allergy   . Anemia   . Asthma    adult onset  . Carotid bruit    left  . Hyperlipidemia    LDL goal = < 120 based on NMR Lipoprofile 2005  . Hypertension   . Metabolic syndrome     Past Surgical History:  Procedure Laterality Date  . CARDIAC CATHETERIZATION  2006   negative  . CATARACT EXTRACTION, BILATERAL     Dr Bing Plume  . COLONOSCOPY  06/02/2006   Dr.Kaplan,Next Due date 06/02/2016  . SHOULDER SURGERY  11/21/2008   Dr.Aplington  . TOTAL ABDOMINAL HYSTERECTOMY W/ BILATERAL SALPINGOOPHORECTOMY  1983   Dr.Fore, for fibroids with pain  . TOTAL HIP ARTHROPLASTY  2005, 2006    Social History   Social History  . Marital status: Widowed    Spouse name: N/A  . Number of children: N/A  . Years of education: N/A   Social History Main Topics  . Smoking status: Never Smoker  . Smokeless tobacco: Never Used  . Alcohol use No  . Drug use: No  . Sexual activity: Not Asked   Other Topics Concern  . None   Social History Narrative  . None    Family History  Problem Relation Age of Onset  . Hypertension Father   . Stroke Father        > 15  . Hypertension Mother   . Arthritis Mother   . Hypertension Sister   . Diabetes Paternal Grandmother   . Heart attack Maternal Grandfather        age unknown; ? > 60  . Stomach cancer Maternal Grandmother     Review of Systems  Constitutional: Negative for chills and fever.  Respiratory: Positive for shortness of breath (with exertion). Negative for cough and wheezing.   Cardiovascular: Negative for chest pain, palpitations and leg swelling.  Gastrointestinal: Negative for abdominal pain.  Musculoskeletal: Positive  for arthralgias and back pain.  Neurological: Positive for light-headedness (intermittent, not recently). Negative for headaches.       Objective:   Vitals:   04/04/17 0923  BP: 124/70  Pulse: (!) 57  Temp: 97.6 F (36.4 C)  SpO2: 97%   Wt Readings from Last 3 Encounters:  04/04/17 241 lb 4 oz (109.4 kg)  12/03/16  242 lb (109.8 kg)  10/10/16 244 lb (110.7 kg)   Body mass index is 38.94 kg/m.   Physical Exam    Constitutional: Appears well-developed and well-nourished. No distress.  HENT:  Head: Normocephalic and atraumatic.  Neck: Neck supple. No tracheal deviation present. No thyromegaly present.  No cervical lymphadenopathy Cardiovascular: Normal rate, regular rhythm and normal heart sounds.   No murmur heard. No carotid bruit .  No edema Pulmonary/Chest: Effort normal and breath sounds normal. No respiratory distress. No has no wheezes. No rales.  Skin: Skin is warm and dry. Not diaphoretic. lipoma posterior upper central back - non tender,  Mobile, squishy Psychiatric: Normal mood and affect. Behavior is normal.      Assessment & Plan:    See Problem List for Assessment and Plan of chronic medical problems.

## 2017-04-04 ENCOUNTER — Encounter: Payer: Self-pay | Admitting: Internal Medicine

## 2017-04-04 ENCOUNTER — Other Ambulatory Visit (INDEPENDENT_AMBULATORY_CARE_PROVIDER_SITE_OTHER): Payer: Medicare Other

## 2017-04-04 ENCOUNTER — Ambulatory Visit (INDEPENDENT_AMBULATORY_CARE_PROVIDER_SITE_OTHER): Payer: Medicare Other | Admitting: Internal Medicine

## 2017-04-04 VITALS — BP 124/70 | HR 57 | Temp 97.6°F | Ht 66.0 in | Wt 241.2 lb

## 2017-04-04 DIAGNOSIS — Z23 Encounter for immunization: Secondary | ICD-10-CM

## 2017-04-04 DIAGNOSIS — E8881 Metabolic syndrome: Secondary | ICD-10-CM

## 2017-04-04 DIAGNOSIS — D649 Anemia, unspecified: Secondary | ICD-10-CM | POA: Diagnosis not present

## 2017-04-04 DIAGNOSIS — F419 Anxiety disorder, unspecified: Secondary | ICD-10-CM

## 2017-04-04 DIAGNOSIS — R7303 Prediabetes: Secondary | ICD-10-CM

## 2017-04-04 DIAGNOSIS — E7800 Pure hypercholesterolemia, unspecified: Secondary | ICD-10-CM | POA: Diagnosis not present

## 2017-04-04 DIAGNOSIS — F329 Major depressive disorder, single episode, unspecified: Secondary | ICD-10-CM

## 2017-04-04 DIAGNOSIS — I10 Essential (primary) hypertension: Secondary | ICD-10-CM

## 2017-04-04 LAB — COMPREHENSIVE METABOLIC PANEL
ALT: 8 U/L (ref 0–35)
AST: 11 U/L (ref 0–37)
Albumin: 3.8 g/dL (ref 3.5–5.2)
Alkaline Phosphatase: 103 U/L (ref 39–117)
BILIRUBIN TOTAL: 0.9 mg/dL (ref 0.2–1.2)
BUN: 24 mg/dL — ABNORMAL HIGH (ref 6–23)
CHLORIDE: 104 meq/L (ref 96–112)
CO2: 30 meq/L (ref 19–32)
Calcium: 9.5 mg/dL (ref 8.4–10.5)
Creatinine, Ser: 1.17 mg/dL (ref 0.40–1.20)
GFR: 57.14 mL/min — AB (ref >60.00)
GLUCOSE: 128 mg/dL — AB (ref 70–99)
POTASSIUM: 4.1 meq/L (ref 3.5–5.1)
Sodium: 138 mEq/L (ref 135–145)
Total Protein: 7.2 g/dL (ref 6.0–8.3)

## 2017-04-04 LAB — LIPID PANEL
CHOL/HDL RATIO: 4
Cholesterol: 148 mg/dL (ref 0–200)
HDL: 34 mg/dL — ABNORMAL LOW (ref >39.00)
LDL CALC: 101 mg/dL — AB (ref 0–99)
NONHDL: 113.79
Triglycerides: 64 mg/dL (ref 0.0–149.0)
VLDL: 12.8 mg/dL (ref 0.0–40.0)

## 2017-04-04 LAB — IBC PANEL
Iron: 77 ug/dL (ref 42–145)
Saturation Ratios: 26.7 % (ref 20.0–50.0)
Transferrin: 206 mg/dL — ABNORMAL LOW (ref 212.0–360.0)

## 2017-04-04 LAB — CBC WITH DIFFERENTIAL/PLATELET
BASOS ABS: 0.1 10*3/uL (ref 0.0–0.1)
Basophils Relative: 0.8 % (ref 0.0–3.0)
Eosinophils Absolute: 0.2 10*3/uL (ref 0.0–0.7)
Eosinophils Relative: 2.3 % (ref 0.0–5.0)
HCT: 34.1 % — ABNORMAL LOW (ref 36.0–46.0)
HEMOGLOBIN: 10.9 g/dL — AB (ref 12.0–15.0)
LYMPHS ABS: 1.3 10*3/uL (ref 0.7–4.0)
Lymphocytes Relative: 15.6 % (ref 12.0–46.0)
MCHC: 32 g/dL (ref 30.0–36.0)
MCV: 98.2 fl (ref 78.0–100.0)
MONO ABS: 0.6 10*3/uL (ref 0.1–1.0)
Monocytes Relative: 7.2 % (ref 3.0–12.0)
Neutro Abs: 6.3 10*3/uL (ref 1.4–7.7)
Neutrophils Relative %: 74.1 % (ref 43.0–77.0)
PLATELETS: 304 10*3/uL (ref 150.0–400.0)
RBC: 3.48 Mil/uL — AB (ref 3.87–5.11)
RDW: 12.1 % (ref 11.5–15.5)
WBC: 8.5 10*3/uL (ref 4.0–10.5)

## 2017-04-04 LAB — HEMOGLOBIN A1C: Hgb A1c MFr Bld: 5.5 % (ref 4.6–6.5)

## 2017-04-04 LAB — FOLATE: Folate: 9.5 ng/mL (ref >5.9)

## 2017-04-04 LAB — FERRITIN: FERRITIN: 429.1 ng/mL — AB (ref 10.0–291.0)

## 2017-04-04 LAB — IRON: IRON: 77 ug/dL (ref 42–145)

## 2017-04-04 MED ORDER — ALBUTEROL SULFATE HFA 108 (90 BASE) MCG/ACT IN AERS: 2.0000 | INHALATION_SPRAY | Freq: Four times a day (QID) | RESPIRATORY_TRACT | 1 refills | Status: DC | PRN

## 2017-04-04 MED ORDER — SERTRALINE HCL 50 MG PO TABS: 50.0000 mg | ORAL_TABLET | Freq: Every day | ORAL | 1 refills | Status: DC

## 2017-04-04 MED ORDER — PRAVASTATIN SODIUM 20 MG PO TABS: 20.0000 mg | ORAL_TABLET | Freq: Every day | ORAL | 1 refills | Status: DC

## 2017-04-04 MED ORDER — LOSARTAN POTASSIUM 100 MG PO TABS: 100.0000 mg | ORAL_TABLET | Freq: Every day | ORAL | 1 refills | Status: DC

## 2017-04-04 MED ORDER — FUROSEMIDE 40 MG PO TABS: 40.0000 mg | ORAL_TABLET | Freq: Every day | ORAL | 1 refills | Status: DC

## 2017-04-04 MED ORDER — AMLODIPINE BESYLATE 5 MG PO TABS: 5.0000 mg | ORAL_TABLET | Freq: Every day | ORAL | 1 refills | Status: DC

## 2017-04-04 NOTE — Assessment & Plan Note (Signed)
Check lipid panel  Continue daily statin Regular exercise and healthy diet encouraged  

## 2017-04-04 NOTE — Assessment & Plan Note (Signed)
Controlled, stable Continue current dose of sertraline, continue ativan as needed only - only takes occasionally

## 2017-04-04 NOTE — Assessment & Plan Note (Signed)
Check a1c Low sugar / carb diet Stressed regular exercise, but her pain limits

## 2017-04-04 NOTE — Assessment & Plan Note (Addendum)
?   Cause, chronic Will check cbc, iron, ferritin, TIBC, folate level

## 2017-04-04 NOTE — Assessment & Plan Note (Signed)
BP well controlled Current regimen effective and well tolerated Continue current medications at current doses cmp  

## 2017-04-04 NOTE — Patient Instructions (Addendum)
  Test(s) ordered today. Your results will be released to MyChart (or called to you) after review, usually within 72hours after test completion. If any changes need to be made, you will be notified at that same time.  All other Health Maintenance issues reviewed.   All recommended immunizations and age-appropriate screenings are up-to-date or discussed.  Flu immunization administered today.   Medications reviewed and updated.  No changes recommended at this time.  Your prescription(s) have been submitted to your pharmacy. Please take as directed and contact our office if you believe you are having problem(s) with the medication(s).   Please followup in 6 months   

## 2017-04-07 ENCOUNTER — Other Ambulatory Visit: Payer: Self-pay | Admitting: Internal Medicine

## 2017-04-11 ENCOUNTER — Encounter: Payer: Self-pay | Admitting: Emergency Medicine

## 2017-04-14 ENCOUNTER — Other Ambulatory Visit: Payer: Self-pay | Admitting: Internal Medicine

## 2017-04-14 NOTE — Telephone Encounter (Signed)
Updated med list, called refill into pharmacy had to leave on pharmacy vm...[name]

## 2017-04-14 NOTE — Telephone Encounter (Signed)
Ok to fill 

## 2017-04-14 NOTE — Telephone Encounter (Signed)
Patient is calling to follow up on this medication. Dr. Quay Burow is going to fill it she states.

## 2017-04-14 NOTE — Telephone Encounter (Signed)
Check [place] registry last filled 09/06/2014...Johny Chess

## 2017-07-01 ENCOUNTER — Other Ambulatory Visit: Payer: Self-pay | Admitting: Internal Medicine

## 2017-07-01 DIAGNOSIS — Z1231 Encounter for screening mammogram for malignant neoplasm of breast: Secondary | ICD-10-CM

## 2017-07-11 ENCOUNTER — Other Ambulatory Visit (INDEPENDENT_AMBULATORY_CARE_PROVIDER_SITE_OTHER): Payer: Medicare Other

## 2017-07-11 ENCOUNTER — Ambulatory Visit (INDEPENDENT_AMBULATORY_CARE_PROVIDER_SITE_OTHER)
Admission: RE | Admit: 2017-07-11 | Discharge: 2017-07-11 | Disposition: A | Discharge status: Home / Self Care | Payer: Medicare Other | Source: Ambulatory Visit | Attending: Family | Admitting: Family

## 2017-07-11 ENCOUNTER — Ambulatory Visit (INDEPENDENT_AMBULATORY_CARE_PROVIDER_SITE_OTHER): Payer: Medicare Other | Admitting: Family

## 2017-07-11 ENCOUNTER — Encounter: Payer: Self-pay | Admitting: Family

## 2017-07-11 VITALS — BP 150/82 | HR 63 | Temp 97.9°F | Resp 16 | Ht 66.0 in | Wt 249.0 lb

## 2017-07-11 DIAGNOSIS — R0602 Shortness of breath: Secondary | ICD-10-CM | POA: Diagnosis not present

## 2017-07-11 DIAGNOSIS — R635 Abnormal weight gain: Secondary | ICD-10-CM

## 2017-07-11 DIAGNOSIS — R109 Unspecified abdominal pain: Secondary | ICD-10-CM

## 2017-07-11 DIAGNOSIS — K625 Hemorrhage of anus and rectum: Secondary | ICD-10-CM | POA: Diagnosis not present

## 2017-07-11 DIAGNOSIS — I10 Essential (primary) hypertension: Secondary | ICD-10-CM | POA: Diagnosis not present

## 2017-07-11 LAB — CBC WITH DIFFERENTIAL/PLATELET
Basophils Absolute: 0.1 10*3/uL (ref 0.0–0.1)
Basophils Relative: 1 % (ref 0.0–3.0)
EOS ABS: 0.2 10*3/uL (ref 0.0–0.7)
Eosinophils Relative: 3 % (ref 0.0–5.0)
HCT: 36.4 % (ref 36.0–46.0)
HEMOGLOBIN: 11.7 g/dL — AB (ref 12.0–15.0)
Lymphocytes Relative: 17.2 % (ref 12.0–46.0)
Lymphs Abs: 1.4 10*3/uL (ref 0.7–4.0)
MCHC: 32.2 g/dL (ref 30.0–36.0)
MCV: 94.9 fl (ref 78.0–100.0)
MONO ABS: 0.5 10*3/uL (ref 0.1–1.0)
Monocytes Relative: 6.4 % (ref 3.0–12.0)
Neutro Abs: 5.7 10*3/uL (ref 1.4–7.7)
Neutrophils Relative %: 72.4 % (ref 43.0–77.0)
Platelets: 294 10*3/uL (ref 150.0–400.0)
RBC: 3.83 Mil/uL — AB (ref 3.87–5.11)
RDW: 12.4 % (ref 11.5–15.5)
WBC: 7.9 10*3/uL (ref 4.0–10.5)

## 2017-07-11 LAB — TSH: TSH: 3.99 u[IU]/mL (ref 0.35–4.50)

## 2017-07-11 MED ORDER — MELOXICAM 7.5 MG PO TABS
7.5000 mg | ORAL_TABLET | Freq: Every day | ORAL | 0 refills | Status: DC
Start: 2017-07-11 — End: 2017-10-03

## 2017-07-11 NOTE — Progress Notes (Signed)
[name] is a 82 y.o. female with the following history as recorded in [employer]:  Patient Active Problem List   Diagnosis Date Noted  . Chronic fatigue 08/07/2016  . DOE (dyspnea on exertion) 08/07/2016  . Chronic knee pain, bilateral 12/29/2015  . Prediabetes 10/13/2015  . Anxiety and depression 10/13/2015  . NONSPECIFIC ABNORMAL ELECTROCARDIOGRAM 08/25/2009  . BRADYCARDIA 04/11/2008  . LOW BACK PAIN, CHRONIC 11/20/2007  . Hyperlipidemia 06/23/2007  . Essential hypertension 06/23/2007  . PLANTAR FASCIITIS 05/29/2007  . MUSCLE CRAMPS 05/29/2007  . Anemia 05/27/2007  . ALLERGIC RHINITIS 05/27/2007  . Intrinsic asthma 05/27/2007  . CAROTID BRUIT, LEFT 05/27/2007  . METABOLIC SYNDROME X 09/60/4540    Current Outpatient Medications  Medication Sig Dispense Refill  . albuterol (PROVENTIL HFA;VENTOLIN HFA) 108 (90 Base) MCG/ACT inhaler Inhale 2 puffs into the lungs every 6 (six) hours as needed for wheezing or shortness of breath. 3 Inhaler 1  . Alum Hydroxide-Mag Carbonate (GAVISCON PO) Take 1 Dose by mouth as directed.     [name] amLODipine (NORVASC) 5 MG tablet Take 1 tablet (5 mg total) by mouth daily. 90 tablet 1  . aspirin 81 MG tablet Take 81 mg by mouth daily.      . Aspirin-Acetaminophen-Caffeine (EXCEDRIN PO) Take 1 Dose by mouth as directed.     . furosemide (LASIX) 40 MG tablet Take 1 tablet (40 mg total) by mouth daily. 90 tablet 1  . Glucosamine-Chondroit-Vit C-Mn (GLUCOSAMINE 1500 COMPLEX PO) Take 1 Dose by mouth daily.     [name] LORazepam (ATIVAN) 1 MG tablet TAKE 1/2 TABLET BY MOUTH EVERY 8 HOURS AS NEEDED 30 tablet 0  . losartan (COZAAR) 100 MG tablet Take 1 tablet (100 mg total) by mouth daily. 90 tablet 1  . pravastatin (PRAVACHOL) 20 MG tablet Take 1 tablet (20 mg total) by mouth at bedtime. 90 tablet 1  . ranitidine (ZANTAC) 150 MG tablet Take 150 mg by mouth 2 (two) times daily as needed for heartburn.     . sertraline (ZOLOFT) 50 MG tablet Take 1 tablet (50 mg total)  by mouth daily. 90 tablet 1  . meloxicam (MOBIC) 7.5 MG tablet Take 1 tablet (7.5 mg total) by mouth daily. May take 2 per day if needed 30 tablet 0   No current facility-administered medications for this visit.     Allergies: Latex  Past Medical History:  Diagnosis Date  . Allergy   . Anemia   . Asthma    adult onset  . Carotid bruit    left  . Hyperlipidemia    LDL goal = < 120 based on NMR Lipoprofile 2005  . Hypertension   . Metabolic syndrome     Past Surgical History:  Procedure Laterality Date  . CARDIAC CATHETERIZATION  2006   negative  . CATARACT EXTRACTION, BILATERAL     Dr Bing Plume  . COLONOSCOPY  06/02/2006   Dr.Kaplan,Next Due date 06/02/2016  . SHOULDER SURGERY  11/21/2008   Dr.Aplington  . TOTAL ABDOMINAL HYSTERECTOMY W/ BILATERAL SALPINGOOPHORECTOMY  1983   Dr.Fore, for fibroids with pain  . TOTAL HIP ARTHROPLASTY  2005, 2006    Family History  Problem Relation Age of Onset  . Hypertension Father   . Stroke Father        > 72  . Hypertension Mother   . Arthritis Mother   . Hypertension Sister   . Diabetes Paternal Grandmother   . Heart attack Maternal Grandfather  age unknown; ? > 48  . Stomach cancer Maternal Grandmother     Social History   Tobacco Use  . Smoking status: Never Smoker  . Smokeless tobacco: Never Used  Substance Use Topics  . Alcohol use: No    Subjective:  Patient presents today with numerous concerns: 1) Right sided flank pain x 1 week; notes that symptoms most noticeable with movement; no rash; no urinary symptoms, no blood in urine; denies chest pain but notes that pain makes it hard for her to take a full breath; no known injury or trauma; notes that it "feels like a spasm." Has not taken anything for symptoms/ has not used heating pad; 2) Notes that she had diarrhea for about 3 days this past week; did notice some bright red blood on the tissue after one of the episodes of diarrhea; no abdominal pain/ bowels have  normalized; no further symptoms; last colonoscopy was in 2007; 3) Has gained 8 pounds since OV in 03/2017; denies any changes in her diet; 4) Notes that blood pressure has been up this past week; wonders if it could be related to the flank pain; Denies any chest pain, shortness of breath, blurred vision or headache.   Objective:  Vitals:   07/11/17 0822  BP: (!) 150/82  Pulse: 63  Resp: 16  Temp: 97.9 F (36.6 C)  TempSrc: Oral  SpO2: 97%  Weight: 249 lb (112.9 kg)  Height: 5\' 6"  (1.676 m)    General: Well developed, well nourished, in no acute distress  Skin : Warm and dry.  Head: Normocephalic and atraumatic  Eyes: Sclera and conjunctiva clear; pupils round and reactive to light; extraocular movements intact  Ears: External normal; canals clear; tympanic membranes normal  Oropharynx: Pink, supple. No suspicious lesions  Neck: Supple without thyromegaly, adenopathy  Lungs: Respirations unlabored; clear to auscultation bilaterally without wheeze, rales, rhonchi  CVS exam: normal rate and regular rhythm.  Musculoskeletal: No deformities; no active joint inflammation  Extremities: No edema, cyanosis, clubbing  Vessels: Symmetric bilaterally  Neurologic: Alert and oriented; speech intact; face symmetrical; moves all extremities well; CNII-XII intact without focal deficit  Assessment:  1. Rt flank pain   2. Shortness of breath   3. Weight gain   4. Bright red rectal bleeding   5. Essential hypertension     Plan:  1. Suspect muscular; trial of Mobic 7.5 mg 1-2 per day as needed; can apply heat; follow-up to be determined. 2. Suspect related to pain in flank; will check D-Dimer and CXR today; 3. Check TSH; follow-up to be determined; 4. Reassurance; most likely related to acute diarrhea; symptoms now resolved; information given on Cologuard- she will call back if she would like this test; follow-up if bleeding returns; 5. Suspect recent elevation due to pain; continue to monitor  and follow-up if consistently above 140/90 after flank pain resolves.   No Follow-up on file.  Orders Placed This Encounter  Procedures  . DG Chest 2 View    Standing Status:   Future    Number of Occurrences:   1    Standing Expiration Date:   09/11/2018    Order Specific Question:   Reason for Exam (SYMPTOM  OR DIAGNOSIS REQUIRED)    Answer:   shortness of breath    Order Specific Question:   Preferred imaging location?    Answer:   [facility]    Order Specific Question:   Radiology Contrast Protocol - do NOT remove file path  Answer:   [email or web address]  . D-Dimer, Quantitative    Standing Status:   Future    Number of Occurrences:   1    Standing Expiration Date:   07/11/2018  . CBC w/Diff    Standing Status:   Future    Number of Occurrences:   1    Standing Expiration Date:   07/11/2018  . TSH    Standing Status:   Future    Number of Occurrences:   1    Standing Expiration Date:   07/11/2018    Requested Prescriptions   Signed Prescriptions Disp Refills  . meloxicam (MOBIC) 7.5 MG tablet 30 tablet 0    Sig: Take 1 tablet (7.5 mg total) by mouth daily. May take 2 per day if needed

## 2017-07-11 NOTE — Patient Instructions (Signed)
Please continue to check your blood pressure; suspect the flank pain is muscular and once this resolves, your blood pressure should return to normal; if it remains elevated, please let Dr. Senaida Ores know;  You could consider Cologuard as an alternative to your colonoscopy; check with your insurance to see if covered and then call Dr. Quay Burow if you want to proceed.

## 2017-07-11 NOTE — Progress Notes (Signed)
Cardiac evaluation including stress test and echo done in may 2018; known history of asthma; no acute changes today;

## 2017-07-12 LAB — D-DIMER, QUANTITATIVE: D-Dimer, Quant: 2.54 mcg/mL FEU — ABNORMAL HIGH (ref <0.50)

## 2017-07-14 ENCOUNTER — Ambulatory Visit (INDEPENDENT_AMBULATORY_CARE_PROVIDER_SITE_OTHER)
Admission: RE | Admit: 2017-07-14 | Discharge: 2017-07-14 | Disposition: A | Discharge status: Home / Self Care | Payer: Medicare Other | Source: Ambulatory Visit | Attending: Family | Admitting: Family

## 2017-07-14 ENCOUNTER — Other Ambulatory Visit (INDEPENDENT_AMBULATORY_CARE_PROVIDER_SITE_OTHER): Payer: Medicare Other

## 2017-07-14 ENCOUNTER — Other Ambulatory Visit: Payer: Self-pay | Admitting: Family

## 2017-07-14 DIAGNOSIS — R0602 Shortness of breath: Secondary | ICD-10-CM

## 2017-07-14 DIAGNOSIS — R7989 Other specified abnormal findings of blood chemistry: Secondary | ICD-10-CM

## 2017-07-14 LAB — COMPREHENSIVE METABOLIC PANEL
ALT: 8 U/L (ref 0–35)
AST: 11 U/L (ref 0–37)
Albumin: 4 g/dL (ref 3.5–5.2)
Alkaline Phosphatase: 116 U/L (ref 39–117)
BUN: 21 mg/dL (ref 6–23)
CHLORIDE: 102 meq/L (ref 96–112)
CO2: 29 meq/L (ref 19–32)
CREATININE: 1.14 mg/dL (ref 0.40–1.20)
Calcium: 9.2 mg/dL (ref 8.4–10.5)
GFR: 58.84 mL/min — ABNORMAL LOW (ref >60.00)
GLUCOSE: 149 mg/dL — AB (ref 70–99)
POTASSIUM: 4.1 meq/L (ref 3.5–5.1)
SODIUM: 138 meq/L (ref 135–145)
Total Bilirubin: 1.2 mg/dL (ref 0.2–1.2)
Total Protein: 7.3 g/dL (ref 6.0–8.3)

## 2017-07-14 MED ORDER — IOPAMIDOL (ISOVUE-370) INJECTION 76%
100.0000 mL | Freq: Once | INTRAVENOUS | Status: AC | PRN
  Administered 2017-07-14: 100 mL via INTRAVENOUS

## 2017-07-14 NOTE — Progress Notes (Signed)
I reviewed results with patient; discussed my concern for elevated D-Dimer with her symptoms; patient notes that symptoms are essentially the same compared to last Friday; will order STAT CT to evaluate for PE; follow-up to be determined.

## 2017-07-17 ENCOUNTER — Telehealth: Payer: Self-pay | Admitting: Internal Medicine

## 2017-07-17 NOTE — Telephone Encounter (Signed)
Copied from [place] [id number]. Topic: General - Other >> Jul 17, 2017  8:16 AM Darl Householder, RMA wrote: Reason for CRM: Patient is requesting a call back from Dr. Quay Burow or CMA concerning CT scan results and wants to know of she needs an earlier appt time or just keep the same appt

## 2017-07-17 NOTE — Telephone Encounter (Signed)
Spoke with pt, advised that per Laura's  Note she was to follow-up with Cardiology. Advised pt to call cardiology and make and appt and let us know if a referral was needed.

## 2017-08-13 ENCOUNTER — Encounter: Payer: Self-pay | Admitting: Cardiology

## 2017-08-13 ENCOUNTER — Ambulatory Visit (INDEPENDENT_AMBULATORY_CARE_PROVIDER_SITE_OTHER): Payer: Medicare Other | Admitting: Cardiology

## 2017-08-13 VITALS — BP 146/82 | HR 62 | Ht 66.0 in | Wt 248.8 lb

## 2017-08-13 DIAGNOSIS — E119 Type 2 diabetes mellitus without complications: Secondary | ICD-10-CM | POA: Diagnosis not present

## 2017-08-13 DIAGNOSIS — I10 Essential (primary) hypertension: Secondary | ICD-10-CM | POA: Diagnosis not present

## 2017-08-13 DIAGNOSIS — I700 Atherosclerosis of aorta: Secondary | ICD-10-CM

## 2017-08-13 DIAGNOSIS — R0600 Dyspnea, unspecified: Secondary | ICD-10-CM | POA: Diagnosis not present

## 2017-08-13 NOTE — Patient Instructions (Signed)
Medication Instructions:  Your physician recommends that you continue on your current medications as directed. Please refer to the Current Medication list given to you today.  Labwork: None  Testing/Procedures: None  Follow-Up: Your physician recommends that you schedule a follow-up appointment as needed with Dr. Marlou Porch.    Any Other Special Instructions Will Be Listed Below (If Applicable).     If you need a refill on your cardiac medications before your next appointment, please call your pharmacy.

## 2017-08-13 NOTE — Progress Notes (Signed)
Cardiology Office Note    Date:  08/13/2017   ID:  [name], DOB [birth year], MRN [id number]  PCP:  Katie Rail, MD  Cardiologist:   Candee Furbish, MD     History of Present Illness:  [name] is a 83 y.o. female with diabetes, hypertension, obesity with long-standing history of lack of energy, chronic back pain and shortness of breath with activity here for follow up.   Prior visit:It is severe in intensity, panting with movement.  Doesn't like to stand for a long time. Relieved with rest. No associated symptoms of chest pain. Sister says that it is because she is overweight.  Previous echocardiogram in 2007 was normal prior to hip surgery. Blood work has been unremarkable. Her blood pressure is much improved after medication management by Dr. Billey Gosling.  Never smoked. No FHX of CAD.   No syncope, no bleeding, no fever. No chemical exposures.   08/13/17  Past Medical History:  Diagnosis Date  . Allergy   . Anemia   . Asthma    adult onset  . Carotid bruit    left  . Hyperlipidemia    LDL goal = < 120 based on NMR Lipoprofile 2005  . Hypertension   . Metabolic syndrome     Past Surgical History:  Procedure Laterality Date  . CARDIAC CATHETERIZATION  2006   negative  . CATARACT EXTRACTION, BILATERAL     Dr Bing Plume  . COLONOSCOPY  06/02/2006   Dr.Kaplan,Next Due date 06/02/2016  . SHOULDER SURGERY  11/21/2008   Dr.Aplington  . TOTAL ABDOMINAL HYSTERECTOMY W/ BILATERAL SALPINGOOPHORECTOMY  1983   Dr.Fore, for fibroids with pain  . TOTAL HIP ARTHROPLASTY  2005, 2006    Current Medications: Outpatient Medications Prior to Visit  Medication Sig Dispense Refill  . albuterol (PROVENTIL HFA;VENTOLIN HFA) 108 (90 Base) MCG/ACT inhaler Inhale 2 puffs into the lungs every 6 (six) hours as needed for wheezing or shortness of breath. 3 Inhaler 1  . Alum Hydroxide-Mag Carbonate (GAVISCON PO) Take 1 Dose by mouth as directed.     [name] amLODipine (NORVASC) 5 MG tablet Take 1  tablet (5 mg total) by mouth daily. 90 tablet 1  . aspirin 81 MG tablet Take 81 mg by mouth daily.      . Aspirin-Acetaminophen-Caffeine (EXCEDRIN PO) Take 1 Dose by mouth as directed.     . furosemide (LASIX) 40 MG tablet Take 1 tablet (40 mg total) by mouth daily. 90 tablet 1  . Glucosamine-Chondroit-Vit C-Mn (GLUCOSAMINE 1500 COMPLEX PO) Take 1 Dose by mouth daily.     [name] LORazepam (ATIVAN) 1 MG tablet TAKE 1/2 TABLET BY MOUTH EVERY 8 HOURS AS NEEDED 30 tablet 0  . losartan (COZAAR) 100 MG tablet Take 1 tablet (100 mg total) by mouth daily. 90 tablet 1  . meloxicam (MOBIC) 7.5 MG tablet Take 1 tablet (7.5 mg total) by mouth daily. May take 2 per day if needed 30 tablet 0  . pravastatin (PRAVACHOL) 20 MG tablet Take 1 tablet (20 mg total) by mouth at bedtime. 90 tablet 1  . ranitidine (ZANTAC) 150 MG tablet Take 150 mg by mouth 2 (two) times daily as needed for heartburn.     . sertraline (ZOLOFT) 50 MG tablet Take 1 tablet (50 mg total) by mouth daily. 90 tablet 1   No facility-administered medications prior to visit.      Allergies:   Latex   Social History   Socioeconomic History  .  Marital status: Widowed    Spouse name: None  . Number of children: None  . Years of education: None  . Highest education level: None  Social Needs  . Financial resource strain: None  . Food insecurity - worry: None  . Food insecurity - inability: None  . Transportation needs - medical: None  . Transportation needs - non-medical: None  Occupational History  . None  Tobacco Use  . Smoking status: Never Smoker  . Smokeless tobacco: Never Used  Substance and Sexual Activity  . Alcohol use: No  . Drug use: No  . Sexual activity: None  Other Topics Concern  . None  Social History Narrative  . None     Family History:  The patient's family history includes Arthritis in her mother; Diabetes in her paternal grandmother; Heart attack in her maternal grandfather; Hypertension in her father, mother,  and sister; Stomach cancer in her maternal grandmother; Stroke in her father.   ROS:   Please see the history of present illness.    Review of Systems  All other systems reviewed and are negative.     PHYSICAL EXAM:   VS:  BP (!) 146/82   Pulse 62   Ht 5\' 6"  (1.676 m)   Wt 248 lb 12.8 oz (112.9 kg)   BMI 40.16 kg/m    GEN: Well nourished, well developed, in no acute distress  HEENT: normal  Neck: no JVD, carotid bruits, or masses Cardiac: RRR; 1/6 systolic murmur, no rubs, or gallops,no edema  Respiratory:  clear to auscultation bilaterally, normal work of breathing GI: soft, nontender, nondistended, + BS MS: no deformity or atrophy  Skin: warm and dry, no rash Neuro:  Alert and Oriented x 3, Strength and sensation are intact Psych: euthymic mood, full affect   Wt Readings from Last 3 Encounters:  08/13/17 248 lb 12.8 oz (112.9 kg)  07/11/17 249 lb (112.9 kg)  04/04/17 241 lb 4 oz (109.4 kg)      Studies/Labs Reviewed:   EKG:  EKG is ordered today. 08/13/17 - NSR, NSSTW changes, personally viewed 10/08/16 - normal sinus rhythm 61, isolated Q-wave in lead 3, low voltage, nonspecific T-wave changes. 06/03/12-sinus bradycardia low voltage, nonspecific T-wave changes heart rate in the 59. Personally viewed  Recent Labs: 07/11/2017: Hemoglobin 11.7; Platelets 294.0; TSH 3.99 07/14/2017: ALT 8; BUN 21; Creatinine, Ser 1.14; Potassium 4.1; Sodium 138   Lipid Panel    Component Value Date/Time   CHOL 148 04/04/2017 1021   TRIG 64.0 04/04/2017 1021   TRIG 47 06/27/2006 1047   HDL 34.00 (L) 04/04/2017 1021   CHOLHDL 4 04/04/2017 1021   VLDL 12.8 04/04/2017 1021   LDLCALC 101 (H) 04/04/2017 1021    Additional studies/ records that were reviewed today include:   ECHO 09/20/05: - Left ventricular ejection fraction was estimated to be 75 %.    There were no left ventricular regional wall motion    abnormalities. - Estimated aortic valve area (by Vmax) was 2.28  cm^2. - Peak transpulmonic valve gradient was 7 mmHg. - LV function is hyperdynamic. However, there is no LVOT gradient.    There is no SAM of the mitral valve. There is no significant    AS. IMPRESSIONS - LV function is hyperdynamic. However, there is no LVOT gradient.    There is no SAM of the mitral valve. There is no significant    AS.  Echo 11/2016: - Left ventricle: The cavity size was normal. There was mild  concentric hypertrophy. Systolic function was normal. The   estimated ejection fraction was in the range of 60% to 65%. Wall   motion was normal; there were no regional wall motion   abnormalities. Doppler parameters are consistent with abnormal   left ventricular relaxation (grade 1 diastolic dysfunction).   Doppler parameters are consistent with high ventricular filling   pressure. - Mitral valve: Calcified annulus. - Right ventricle: The cavity size was normal. Wall thickness was   normal. - Atrial septum: No defect or patent foramen ovale was identified   by color flow Doppler. - Tricuspid valve: There was trivial regurgitation. - Pulmonary arteries: Systolic pressure was within the normal   range. PA peak pressure: 24 mm Hg (S).  Nuclear stress test: 12/04/16 Low risk, no ischemia  07/14/17 - CT scan of chest: no PE, aortic atherosclerosis, mild coronary calcification noted, mildly enlarged heart/cardiomegaly-from hypertension  ASSESSMENT:    1. Dyspnea, unspecified type   2. Essential hypertension   3. Morbid obesity ([place])   4. Aortic atherosclerosis ([place])   5. Diabetes mellitus with coincident hypertension ([facility])      PLAN:  In order of problems listed above:  Dyspnea on exertion  - Reassuring Nuc stress and echo as well as CT scan.  Mild cardiomegaly from hypertension, normal ejection fraction.  continue to treat hypertension and other risk factors.  Continue with pravastatin given coronary artery calcification/aortic  atherosclerosis.  Stress test previously performed was reassuring with no evidence of ischemia hence no evidence of flow-limiting CAD.  Continue to encourage conditioning.   Diabetes with hypertension  - Hemoglobin A1c 6.0. Management per Dr. Quay Burow. Medications reviewed.  Currently stable.  Essential hypertension  - Medications reviewed.  Minimally elevated today but overall doing quite well with her multidrug regimen.    Medication Adjustments/Labs and Tests Ordered: Current medicines are reviewed at length with the patient today.  Concerns regarding medicines are outlined above.  Medication changes, Labs and Tests ordered today are listed in the Patient Instructions below. Patient Instructions  Medication Instructions:  Your physician recommends that you continue on your current medications as directed. Please refer to the Current Medication list given to you today.  Labwork: None  Testing/Procedures: None  Follow-Up: Your physician recommends that you schedule a follow-up appointment as needed with Dr. Marlou Porch.    Any Other Special Instructions Will Be Listed Below (If Applicable).     If you need a refill on your cardiac medications before your next appointment, please call your pharmacy.      Signed, Candee Furbish, MD  08/13/2017 9:12 AM    [facility] [place], [place], [place]  [address] Phone: [phone]; Fax: [phone]

## 2017-10-02 NOTE — Patient Instructions (Addendum)
Test(s) ordered today. Your results will be released to [facility] (or called to you) after review, usually within 72hours after test completion. If any changes need to be made, you will be notified at that same time.  All other Health Maintenance issues reviewed.   All recommended immunizations and age-appropriate screenings are up-to-date or discussed.  No immunizations administered today.   Medications reviewed and updated.  No changes recommended at this time.   Please followup in 6 months   Health Maintenance, Female Adopting a healthy lifestyle and getting preventive care can go a long way to promote health and wellness. Talk with your health care provider about what schedule of regular examinations is right for you. This is a good chance for you to check in with your provider about disease prevention and staying healthy. In between checkups, there are plenty of things you can do on your own. Experts have done a lot of research about which lifestyle changes and preventive measures are most likely to keep you healthy. Ask your health care provider for more information. Weight and diet Eat a healthy diet  Be sure to include plenty of vegetables, fruits, low-fat dairy products, and lean protein.  Do not eat a lot of foods high in solid fats, added sugars, or salt.  Get regular exercise. This is one of the most important things you can do for your health. ? Most adults should exercise for at least 150 minutes each week. The exercise should increase your heart rate and make you sweat (moderate-intensity exercise). ? Most adults should also do strengthening exercises at least twice a week. This is in addition to the moderate-intensity exercise.  Maintain a healthy weight  Body mass index (BMI) is a measurement that can be used to identify possible weight problems. It estimates body fat based on height and weight. Your health care provider can help determine your BMI and help you achieve or  maintain a healthy weight.  For females 40 years of age and older: ? A BMI below 18.5 is considered underweight. ? A BMI of 18.5 to 24.9 is normal. ? A BMI of 25 to 29.9 is considered overweight. ? A BMI of 30 and above is considered obese.  Watch levels of cholesterol and blood lipids  You should start having your blood tested for lipids and cholesterol at 81 years of age, then have this test every 5 years.  You may need to have your cholesterol levels checked more often if: ? Your lipid or cholesterol levels are high. ? You are older than 81 years of age. ? You are at high risk for heart disease.  Cancer screening Lung Cancer  Lung cancer screening is recommended for adults 59-64 years old who are at high risk for lung cancer because of a history of smoking.  A yearly low-dose CT scan of the lungs is recommended for people who: ? Currently smoke. ? Have quit within the past 15 years. ? Have at least a 30-pack-year history of smoking. A pack year is smoking an average of one pack of cigarettes a day for 1 year.  Yearly screening should continue until it has been 15 years since you quit.  Yearly screening should stop if you develop a health problem that would prevent you from having lung cancer treatment.  Breast Cancer  Practice breast self-awareness. This means understanding how your breasts normally appear and feel.  It also means doing regular breast self-exams. Let your health care provider know about any changes,  no matter how small.  If you are in your 20s or 30s, you should have a clinical breast exam (CBE) by a health care provider every 1-3 years as part of a regular health exam.  If you are 40 or older, have a CBE every year. Also consider having a breast X-ray (mammogram) every year.  If you have a family history of breast cancer, talk to your health care provider about genetic screening.  If you are at high risk for breast cancer, talk to your health care  provider about having an MRI and a mammogram every year.  Breast cancer gene (BRCA) assessment is recommended for women who have family members with BRCA-related cancers. BRCA-related cancers include: ? Breast. ? Ovarian. ? Tubal. ? Peritoneal cancers.  Results of the assessment will determine the need for genetic counseling and BRCA1 and BRCA2 testing.  Cervical Cancer Your health care provider may recommend that you be screened regularly for cancer of the pelvic organs (ovaries, uterus, and vagina). This screening involves a pelvic examination, including checking for microscopic changes to the surface of your cervix (Pap test). You may be encouraged to have this screening done every 3 years, beginning at age 21.  For women ages 30-65, health care providers may recommend pelvic exams and Pap testing every 3 years, or they may recommend the Pap and pelvic exam, combined with testing for human papilloma virus (HPV), every 5 years. Some types of HPV increase your risk of cervical cancer. Testing for HPV may also be done on women of any age with unclear Pap test results.  Other health care providers may not recommend any screening for nonpregnant women who are considered low risk for pelvic cancer and who do not have symptoms. Ask your health care provider if a screening pelvic exam is right for you.  If you have had past treatment for cervical cancer or a condition that could lead to cancer, you need Pap tests and screening for cancer for at least 20 years after your treatment. If Pap tests have been discontinued, your risk factors (such as having a new sexual partner) need to be reassessed to determine if screening should resume. Some women have medical problems that increase the chance of getting cervical cancer. In these cases, your health care provider may recommend more frequent screening and Pap tests.  Colorectal Cancer  This type of cancer can be detected and often prevented.  Routine  colorectal cancer screening usually begins at 81 years of age and continues through 81 years of age.  Your health care provider may recommend screening at an earlier age if you have risk factors for colon cancer.  Your health care provider may also recommend using home test kits to check for hidden blood in the stool.  A small camera at the end of a tube can be used to examine your colon directly (sigmoidoscopy or colonoscopy). This is done to check for the earliest forms of colorectal cancer.  Routine screening usually begins at age 50.  Direct examination of the colon should be repeated every 5-10 years through 81 years of age. However, you may need to be screened more often if early forms of precancerous polyps or small growths are found.  Skin Cancer  Check your skin from head to toe regularly.  Tell your health care provider about any new moles or changes in moles, especially if there is a change in a mole's shape or color.  Also tell your health care provider if you   have a mole that is larger than the size of a pencil eraser.  Always use sunscreen. Apply sunscreen liberally and repeatedly throughout the day.  Protect yourself by wearing long sleeves, pants, a wide-brimmed hat, and sunglasses whenever you are outside.  Heart disease, diabetes, and high blood pressure  High blood pressure causes heart disease and increases the risk of stroke. High blood pressure is more likely to develop in: ? People who have blood pressure in the high end of the normal range (130-139/85-89 mm Hg). ? People who are overweight or obese. ? People who are African American.  If you are 24-25 years of age, have your blood pressure checked every 3-5 years. If you are 2 years of age or older, have your blood pressure checked every year. You should have your blood pressure measured twice-once when you are at a hospital or clinic, and once when you are not at a hospital or clinic. Record the average of the  two measurements. To check your blood pressure when you are not at a hospital or clinic, you can use: ? An automated blood pressure machine at a pharmacy. ? A home blood pressure monitor.  If you are between 42 years and 59 years old, ask your health care provider if you should take aspirin to prevent strokes.  Have regular diabetes screenings. This involves taking a blood sample to check your fasting blood sugar level. ? If you are at a normal weight and have a low risk for diabetes, have this test once every three years after 81 years of age. ? If you are overweight and have a high risk for diabetes, consider being tested at a younger age or more often. Preventing infection Hepatitis B  If you have a higher risk for hepatitis B, you should be screened for this virus. You are considered at high risk for hepatitis B if: ? You were born in a country where hepatitis B is common. Ask your health care provider which countries are considered high risk. ? Your parents were born in a high-risk country, and you have not been immunized against hepatitis B (hepatitis B vaccine). ? You have HIV or AIDS. ? You use needles to inject street drugs. ? You live with someone who has hepatitis B. ? You have had sex with someone who has hepatitis B. ? You get hemodialysis treatment. ? You take certain medicines for conditions, including cancer, organ transplantation, and autoimmune conditions.  Hepatitis C  Blood testing is recommended for: ? Everyone born from [birth year] through 1963. ? Anyone with known risk factors for hepatitis C.  Sexually transmitted infections (STIs)  You should be screened for sexually transmitted infections (STIs) including gonorrhea and chlamydia if: ? You are sexually active and are younger than 81 years of age. ? You are older than 81 years of age and your health care provider tells you that you are at risk for this type of infection. ? Your sexual activity has changed since you  were last screened and you are at an increased risk for chlamydia or gonorrhea. Ask your health care provider if you are at risk.  If you do not have HIV, but are at risk, it may be recommended that you take a prescription medicine daily to prevent HIV infection. This is called pre-exposure prophylaxis (PrEP). You are considered at risk if: ? You are sexually active and do not regularly use condoms or know the HIV status of your partner(s). ? You take drugs by injection. ?  You are sexually active with a partner who has HIV.  Talk with your health care provider about whether you are at high risk of being infected with HIV. If you choose to begin PrEP, you should first be tested for HIV. You should then be tested every 3 months for as long as you are taking PrEP. Pregnancy  If you are premenopausal and you may become pregnant, ask your health care provider about preconception counseling.  If you may become pregnant, take 400 to 800 micrograms (mcg) of folic acid every day.  If you want to prevent pregnancy, talk to your health care provider about birth control (contraception). Osteoporosis and menopause  Osteoporosis is a disease in which the bones lose minerals and strength with aging. This can result in serious bone fractures. Your risk for osteoporosis can be identified using a bone density scan.  If you are 65 years of age or older, or if you are at risk for osteoporosis and fractures, ask your health care provider if you should be screened.  Ask your health care provider whether you should take a calcium or vitamin D supplement to lower your risk for osteoporosis.  Menopause may have certain physical symptoms and risks.  Hormone replacement therapy may reduce some of these symptoms and risks. Talk to your health care provider about whether hormone replacement therapy is right for you. Follow these instructions at home:  Schedule regular health, dental, and eye exams.  Stay current  with your immunizations.  Do not use any tobacco products including cigarettes, chewing tobacco, or electronic cigarettes.  If you are pregnant, do not drink alcohol.  If you are breastfeeding, limit how much and how often you drink alcohol.  Limit alcohol intake to no more than 1 drink per day for nonpregnant women. One drink equals 12 ounces of beer, 5 ounces of wine, or 1 ounces of hard liquor.  Do not use street drugs.  Do not share needles.  Ask your health care provider for help if you need support or information about quitting drugs.  Tell your health care provider if you often feel depressed.  Tell your health care provider if you have ever been abused or do not feel safe at home. This information is not intended to replace advice given to you by your health care provider. Make sure you discuss any questions you have with your health care provider. Document Released: 01/14/2011 Document Revised: 12/07/2015 Document Reviewed: 04/04/2015 [employer]vier Interactive Patient Education  2018 [employer]  

## 2017-10-02 NOTE — Progress Notes (Signed)
Subjective:    Patient ID: [name], female    DOB: 22-Jan-1935, 83 y.o.   MRN: [id number]  HPI She is here for a physical exam.     Medications and allergies reviewed with patient and updated if appropriate.  Patient Active Problem List   Diagnosis Date Noted  . Chronic fatigue 08/07/2016  . DOE (dyspnea on exertion) 08/07/2016  . Chronic knee pain, bilateral 12/29/2015  . Prediabetes 10/13/2015  . Anxiety and depression 10/13/2015  . NONSPECIFIC ABNORMAL ELECTROCARDIOGRAM 08/25/2009  . LOW BACK PAIN, CHRONIC 11/20/2007  . Hyperlipidemia 06/23/2007  . Essential hypertension 06/23/2007  . MUSCLE CRAMPS 05/29/2007  . Anemia 05/27/2007  . ALLERGIC RHINITIS 05/27/2007  . Intrinsic asthma 05/27/2007  . CAROTID BRUIT, LEFT 05/27/2007  . METABOLIC SYNDROME X 35/57/3220    Current Outpatient Medications on File Prior to Visit  Medication Sig Dispense Refill  . albuterol (PROVENTIL HFA;VENTOLIN HFA) 108 (90 Base) MCG/ACT inhaler Inhale 2 puffs into the lungs every 6 (six) hours as needed for wheezing or shortness of breath. 3 Inhaler 1  . Alum Hydroxide-Mag Carbonate (GAVISCON PO) Take 1 Dose by mouth as directed.     [name] amLODipine (NORVASC) 5 MG tablet Take 1 tablet (5 mg total) by mouth daily. 90 tablet 1  . aspirin 81 MG tablet Take 81 mg by mouth daily.      . Aspirin-Acetaminophen-Caffeine (EXCEDRIN PO) Take 1 Dose by mouth as directed.     . furosemide (LASIX) 40 MG tablet Take 1 tablet (40 mg total) by mouth daily. 90 tablet 1  . LORazepam (ATIVAN) 1 MG tablet TAKE 1/2 TABLET BY MOUTH EVERY 8 HOURS AS NEEDED 30 tablet 0  . losartan (COZAAR) 100 MG tablet Take 1 tablet (100 mg total) by mouth daily. 90 tablet 1  . pravastatin (PRAVACHOL) 20 MG tablet Take 1 tablet (20 mg total) by mouth at bedtime. 90 tablet 1  . ranitidine (ZANTAC) 150 MG tablet Take 150 mg by mouth 2 (two) times daily as needed for heartburn.     . sertraline (ZOLOFT) 50 MG tablet Take 1 tablet (50 mg  total) by mouth daily. 90 tablet 1   No current facility-administered medications on file prior to visit.     Past Medical History:  Diagnosis Date  . Allergy   . Anemia   . Asthma    adult onset  . Carotid bruit    left  . Hyperlipidemia    LDL goal = < 120 based on NMR Lipoprofile 2005  . Hypertension   . Metabolic syndrome     Past Surgical History:  Procedure Laterality Date  . CARDIAC CATHETERIZATION  2006   negative  . CATARACT EXTRACTION, BILATERAL     Dr Bing Plume  . COLONOSCOPY  06/02/2006   Dr.Kaplan,Next Due date 06/02/2016  . SHOULDER SURGERY  11/21/2008   Dr.Aplington  . TOTAL ABDOMINAL HYSTERECTOMY W/ BILATERAL SALPINGOOPHORECTOMY  1983   Dr.Fore, for fibroids with pain  . TOTAL HIP ARTHROPLASTY  2005, 2006    Social History   Socioeconomic History  . Marital status: Widowed    Spouse name: Not on file  . Number of children: Not on file  . Years of education: Not on file  . Highest education level: Not on file  Occupational History  . Not on file  Social Needs  . Financial resource strain: Not on file  . Food insecurity:    Worry: Not on file    Inability: Not  on file  . Transportation needs:    Medical: Not on file    Non-medical: Not on file  Tobacco Use  . Smoking status: Never Smoker  . Smokeless tobacco: Never Used  Substance and Sexual Activity  . Alcohol use: No  . Drug use: No  . Sexual activity: Not on file  Lifestyle  . Physical activity:    Days per week: Not on file    Minutes per session: Not on file  . Stress: Not on file  Relationships  . Social connections:    Talks on phone: Not on file    Gets together: Not on file    Attends religious service: Not on file    Active member of club or organization: Not on file    Attends meetings of clubs or organizations: Not on file    Relationship status: Not on file  Other Topics Concern  . Not on file  Social History Narrative  . Not on file    Family History  Problem  Relation Age of Onset  . Hypertension Father   . Stroke Father        > 64  . Hypertension Mother   . Arthritis Mother   . Hypertension Sister   . Diabetes Paternal Grandmother   . Heart attack Maternal Grandfather        age unknown; ? > 72  . Stomach cancer Maternal Grandmother     Review of Systems  Constitutional: Positive for fatigue. Negative for chills and fever.  Eyes: Negative for visual disturbance.  Respiratory: Positive for shortness of breath (with acitivty). Negative for cough and wheezing.   Cardiovascular: Negative for chest pain, palpitations and leg swelling.  Gastrointestinal: Negative for abdominal pain, blood in stool, constipation, diarrhea and nausea.       No gerd  Genitourinary: Negative for dysuria and hematuria.  Musculoskeletal: Positive for arthralgias and back pain.  Skin: Negative for color change and rash.  Neurological: Negative for light-headedness and headaches.  Psychiatric/Behavioral: Positive for dysphoric mood (controlled). The patient is nervous/anxious (controlled).        Objective:   Vitals:   10/03/17 0843  BP: (!) 144/90  Pulse: 75  Resp: 20  Temp: 97.9 F (36.6 C)  SpO2: 98%   Filed Weights   10/03/17 0843  Weight: 248 lb (112.5 kg)   Body mass index is 40.03 kg/m.  Wt Readings from Last 3 Encounters:  10/03/17 248 lb (112.5 kg)  08/13/17 248 lb 12.8 oz (112.9 kg)  07/11/17 249 lb (112.9 kg)     Physical Exam Constitutional: She appears well-developed and well-nourished. No distress.  HENT:  Head: Normocephalic and atraumatic.  Right Ear: External ear normal. Normal ear canal and TM Left Ear: External ear normal.  Normal ear canal and TM Mouth/Throat: Oropharynx is clear and moist.  Eyes: Conjunctivae and EOM are normal.  Neck: Neck supple. No tracheal deviation present. No thyromegaly present.  No carotid bruit  Cardiovascular: Normal rate, regular rhythm and normal heart sounds.   No murmur heard.  No  edema. Pulmonary/Chest: Effort normal and breath sounds normal. No respiratory distress. She has no wheezes. She has no rales.  Breast: deferred to Gyn Abdominal: Soft. She exhibits no distension. There is no tenderness.  Lymphadenopathy: She has no cervical adenopathy.  Skin: Skin is warm and dry. She is not diaphoretic.  Psychiatric: She has a normal mood and affect. Her behavior is normal.        Assessment &  Plan:   Physical exam: Screening blood work   ordered Immunizations discussed shingrix, others up to date Colonoscopy -  No longer needed due to age 89 -  No longer needed due to age 64 -  No longer needed due to age Dexa -  Will think about it Eye exams  Up to date  - scheduled EKG       Done 07/2017 Exercise  None due to severe knee OA Weight  Encouraged decreased portions and weight loss Skin   No concerns Substance abuse   none  See Problem List for Assessment and Plan of chronic medical problems.   FU in 6 months

## 2017-10-03 ENCOUNTER — Ambulatory Visit (INDEPENDENT_AMBULATORY_CARE_PROVIDER_SITE_OTHER): Payer: Medicare Other | Admitting: Internal Medicine

## 2017-10-03 ENCOUNTER — Other Ambulatory Visit (INDEPENDENT_AMBULATORY_CARE_PROVIDER_SITE_OTHER): Payer: Medicare Other

## 2017-10-03 ENCOUNTER — Encounter: Payer: Self-pay | Admitting: Internal Medicine

## 2017-10-03 ENCOUNTER — Ambulatory Visit (INDEPENDENT_AMBULATORY_CARE_PROVIDER_SITE_OTHER): Payer: Medicare Other | Admitting: *Deleted

## 2017-10-03 VITALS — BP 144/90 | HR 75 | Temp 97.9°F | Resp 20 | Ht 66.0 in | Wt 248.0 lb

## 2017-10-03 VITALS — BP 144/90 | HR 75 | Resp 18 | Ht 65.0 in | Wt 248.0 lb

## 2017-10-03 DIAGNOSIS — E7849 Other hyperlipidemia: Secondary | ICD-10-CM

## 2017-10-03 DIAGNOSIS — I10 Essential (primary) hypertension: Secondary | ICD-10-CM | POA: Diagnosis not present

## 2017-10-03 DIAGNOSIS — G8929 Other chronic pain: Secondary | ICD-10-CM

## 2017-10-03 DIAGNOSIS — M25561 Pain in right knee: Secondary | ICD-10-CM | POA: Diagnosis not present

## 2017-10-03 DIAGNOSIS — D649 Anemia, unspecified: Secondary | ICD-10-CM

## 2017-10-03 DIAGNOSIS — M25562 Pain in left knee: Secondary | ICD-10-CM

## 2017-10-03 DIAGNOSIS — R7303 Prediabetes: Secondary | ICD-10-CM

## 2017-10-03 DIAGNOSIS — F329 Major depressive disorder, single episode, unspecified: Secondary | ICD-10-CM | POA: Diagnosis not present

## 2017-10-03 DIAGNOSIS — Z0000 Encounter for general adult medical examination without abnormal findings: Secondary | ICD-10-CM | POA: Diagnosis not present

## 2017-10-03 DIAGNOSIS — M545 Low back pain, unspecified: Secondary | ICD-10-CM

## 2017-10-03 DIAGNOSIS — F419 Anxiety disorder, unspecified: Secondary | ICD-10-CM | POA: Diagnosis not present

## 2017-10-03 DIAGNOSIS — F32A Depression, unspecified: Secondary | ICD-10-CM

## 2017-10-03 LAB — CBC WITH DIFFERENTIAL/PLATELET
BASOS PCT: 1 % (ref 0.0–3.0)
Basophils Absolute: 0.1 10*3/uL (ref 0.0–0.1)
EOS PCT: 3.1 % (ref 0.0–5.0)
Eosinophils Absolute: 0.2 10*3/uL (ref 0.0–0.7)
HCT: 33.6 % — ABNORMAL LOW (ref 36.0–46.0)
Hemoglobin: 11.1 g/dL — ABNORMAL LOW (ref 12.0–15.0)
Lymphocytes Relative: 17.6 % (ref 12.0–46.0)
Lymphs Abs: 1.3 10*3/uL (ref 0.7–4.0)
MCHC: 33 g/dL (ref 30.0–36.0)
MCV: 93.2 fl (ref 78.0–100.0)
MONO ABS: 0.5 10*3/uL (ref 0.1–1.0)
Monocytes Relative: 7 % (ref 3.0–12.0)
NEUTROS PCT: 71.3 % (ref 43.0–77.0)
Neutro Abs: 5.4 10*3/uL (ref 1.4–7.7)
Platelets: 284 10*3/uL (ref 150.0–400.0)
RBC: 3.61 Mil/uL — ABNORMAL LOW (ref 3.87–5.11)
RDW: 12.1 % (ref 11.5–15.5)
WBC: 7.5 10*3/uL (ref 4.0–10.5)

## 2017-10-03 LAB — COMPREHENSIVE METABOLIC PANEL
ALK PHOS: 123 U/L — AB (ref 39–117)
ALT: 7 U/L (ref 0–35)
AST: 10 U/L (ref 0–37)
Albumin: 4.1 g/dL (ref 3.5–5.2)
BUN: 25 mg/dL — AB (ref 6–23)
CHLORIDE: 103 meq/L (ref 96–112)
CO2: 26 mEq/L (ref 19–32)
CREATININE: 1.19 mg/dL (ref 0.40–1.20)
Calcium: 9.7 mg/dL (ref 8.4–10.5)
GFR: 55.96 mL/min — ABNORMAL LOW (ref >60.00)
Glucose, Bld: 141 mg/dL — ABNORMAL HIGH (ref 70–99)
POTASSIUM: 4.1 meq/L (ref 3.5–5.1)
Sodium: 137 mEq/L (ref 135–145)
TOTAL PROTEIN: 7.6 g/dL (ref 6.0–8.3)
Total Bilirubin: 0.7 mg/dL (ref 0.2–1.2)

## 2017-10-03 LAB — IRON,TIBC AND FERRITIN PANEL
%SAT: 27 % (ref 11–50)
FERRITIN: 523 ng/mL — AB (ref 20–288)
Iron: 75 ug/dL (ref 45–160)
TIBC: 273 mcg/dL (calc) (ref 250–450)

## 2017-10-03 LAB — LIPID PANEL
Cholesterol: 152 mg/dL (ref 0–200)
HDL: 33.6 mg/dL — ABNORMAL LOW (ref >39.00)
LDL Cholesterol: 103 mg/dL — ABNORMAL HIGH (ref 0–99)
NONHDL: 118.79
Total CHOL/HDL Ratio: 5
Triglycerides: 78 mg/dL (ref 0.0–149.0)
VLDL: 15.6 mg/dL (ref 0.0–40.0)

## 2017-10-03 LAB — HEMOGLOBIN A1C: Hgb A1c MFr Bld: 6 % (ref 4.6–6.5)

## 2017-10-03 LAB — TSH: TSH: 2.23 u[IU]/mL (ref 0.35–4.50)

## 2017-10-03 MED ORDER — LORATADINE 10 MG PO TABS: 10.0000 mg | ORAL_TABLET | Freq: Every day | ORAL | 11 refills | Status: AC

## 2017-10-03 NOTE — Assessment & Plan Note (Signed)
Taking sertraline daily, ativan prn only - maybe 2/month Overall anxiety and depression controlled Continue current medication regimen

## 2017-10-03 NOTE — Progress Notes (Addendum)
Subjective:   [name] is a 83 y.o. female who presents for Medicare Annual (Subsequent) preventive examination.  Review of Systems:  No ROS.  Medicare Wellness Visit. Additional risk factors are reflected in the social history.  Cardiac Risk Factors include: advanced age (>63men, >36 women);dyslipidemia;hypertension;sedentary lifestyle;obesity (BMI >30kg/m2) Sleep patterns: has frequent nighttime awakenings, gets up 2-3 times nightly to void and sleeps 5-6 hours nightly.  Patient reports insomnia issues, discussed recommended sleep tips, education was provided via handout.   Home Safety/Smoke Alarms: Feels safe in home. Smoke alarms in place.  Living environment; residence and Firearm Safety: 1-story house/ trailer, equipment: Radio producer, Type: Single Point [place], Walkers, Type: Conservation officer, nature and Stairs, Type: 4-5 steps to get into the house., no firearms. Patient states she needs a tub bench and grab bars placed int her bathroom. Seat Belt Safety/Bike Helmet: Wears seat belt.     Objective:     Vitals: BP (!) 144/90   Pulse 75   Resp 18   Ht 5\' 5"  (1.651 m)   Wt 248 lb (112.5 kg)   SpO2 98%   BMI 41.27 kg/m   Body mass index is 41.27 kg/m.  Advanced Directives 10/03/2017  Does Patient Have a Medical Advance Directive? No  Would patient like information on creating a medical advance directive? No - Patient declined    Tobacco Social History   Tobacco Use  Smoking Status Never Smoker  Smokeless Tobacco Never Used     Counseling given: Not Answered  Past Medical History:  Diagnosis Date  . Allergy   . Anemia   . Asthma    adult onset  . Carotid bruit    left  . Hyperlipidemia    LDL goal = < 120 based on NMR Lipoprofile 2005  . Hypertension   . Metabolic syndrome    Past Surgical History:  Procedure Laterality Date  . CARDIAC CATHETERIZATION  2006   negative  . CATARACT EXTRACTION, BILATERAL     Dr Bing Plume  . COLONOSCOPY  06/02/2006   Dr.Kaplan,Next Due date  06/02/2016  . SHOULDER SURGERY  11/21/2008   Dr.Aplington  . TOTAL ABDOMINAL HYSTERECTOMY W/ BILATERAL SALPINGOOPHORECTOMY  1983   Dr.Fore, for fibroids with pain  . TOTAL HIP ARTHROPLASTY  2005, 2006   Family History  Problem Relation Age of Onset  . Hypertension Father   . Stroke Father        > 57  . Hypertension Mother   . Arthritis Mother   . Hypertension Sister   . Diabetes Paternal Grandmother   . Heart attack Maternal Grandfather        age unknown; ? > 39  . Stomach cancer Maternal Grandmother    Social History   Socioeconomic History  . Marital status: Widowed    Spouse name: Not on file  . Number of children: Not on file  . Years of education: Not on file  . Highest education level: Not on file  Occupational History  . Not on file  Social Needs  . Financial resource strain: Not on file  . Food insecurity:    Worry: Not on file    Inability: Not on file  . Transportation needs:    Medical: Not on file    Non-medical: Not on file  Tobacco Use  . Smoking status: Never Smoker  . Smokeless tobacco: Never Used  Substance and Sexual Activity  . Alcohol use: No  . Drug use: No  . Sexual activity: Not on  file  Lifestyle  . Physical activity:    Days per week: Not on file    Minutes per session: Not on file  . Stress: Not on file  Relationships  . Social connections:    Talks on phone: Not on file    Gets together: Not on file    Attends religious service: Not on file    Active member of club or organization: Not on file    Attends meetings of clubs or organizations: Not on file    Relationship status: Not on file  Other Topics Concern  . Not on file  Social History Narrative  . Not on file    Outpatient Encounter Medications as of 10/03/2017  Medication Sig  . albuterol (PROVENTIL HFA;VENTOLIN HFA) 108 (90 Base) MCG/ACT inhaler Inhale 2 puffs into the lungs every 6 (six) hours as needed for wheezing or shortness of breath.  . Alum Hydroxide-Mag  Carbonate (GAVISCON PO) Take 1 Dose by mouth as directed.   [name] amLODipine (NORVASC) 5 MG tablet Take 1 tablet (5 mg total) by mouth daily.  [name] aspirin 81 MG tablet Take 81 mg by mouth daily.    . Aspirin-Acetaminophen-Caffeine (EXCEDRIN PO) Take 1 Dose by mouth as directed.   . furosemide (LASIX) 40 MG tablet Take 1 tablet (40 mg total) by mouth daily.  [name] LORazepam (ATIVAN) 1 MG tablet TAKE 1/2 TABLET BY MOUTH EVERY 8 HOURS AS NEEDED  . losartan (COZAAR) 100 MG tablet Take 1 tablet (100 mg total) by mouth daily.  . pravastatin (PRAVACHOL) 20 MG tablet Take 1 tablet (20 mg total) by mouth at bedtime.  . ranitidine (ZANTAC) 150 MG tablet Take 150 mg by mouth 2 (two) times daily as needed for heartburn.   . sertraline (ZOLOFT) 50 MG tablet Take 1 tablet (50 mg total) by mouth daily.  . [DISCONTINUED] meloxicam (MOBIC) 7.5 MG tablet Take 1 tablet (7.5 mg total) by mouth daily. May take 2 per day if needed   No facility-administered encounter medications on file as of 10/03/2017.     Activities of Daily Living In your present state of health, do you have any difficulty performing the following activities: 10/03/2017  Hearing? N  Vision? N  Difficulty concentrating or making decisions? N  Walking or climbing stairs? Y  Dressing or bathing? N  Doing errands, shopping? N  Preparing Food and eating ? N  Using the Toilet? N  In the past six months, have you accidently leaked urine? N  Do you have problems with loss of bowel control? N  Managing your Medications? N  Managing your Finances? N  Housekeeping or managing your Housekeeping? N  Some recent data might be hidden    Patient Care Team: Binnie Rail, MD as PCP - General (Internal Medicine) Jerline Pain, MD as Consulting Physician (Cardiology) Nicholes Stairs, MD as Consulting Physician (Orthopedic Surgery)    Assessment:   This is a routine wellness examination for [name]. Physical assessment deferred to PCP.   Exercise  Activities and Dietary recommendations Current Exercise Habits: The patient does not participate in regular exercise at present(chair exercise pamphlets and knee exercise pamphlet provided), Exercise limited by: orthopedic condition(s)  Diet (meal preparation, eat out, water intake, caffeinated beverages, dairy products, fruits and vegetables): in general, a "healthy" diet     Reviewed heart healthy diet, encouraged patient to increase daily water intake. Discussed weight loss strategies, diet education was provided via handout.  Goals    . Patient  Stated     Increase my physical activity by beginning to do chair exercises and progress to walking when I am able to do so. I will increase the amount of fluids and water that I drink daily.       Fall Risk Fall Risk  10/03/2017 04/04/2017 02/28/2016 08/12/2013  Falls in the past year? No Yes No No  Number falls in past yr: - 2 or more - -  Injury with Fall? - Yes - -  Follow up - Education provided - -    Depression Screen PHQ 2/9 Scores 10/03/2017 04/04/2017 02/28/2016 08/12/2013  PHQ - 2 Score 2 0 0 0  PHQ- 9 Score 7 - - -     Cognitive Function MMSE - Mini Mental State Exam 10/03/2017  Orientation to time 5  Orientation to Place 5  Registration 3  Attention/ Calculation 5  Recall 2  Language- name 2 objects 2  Language- repeat 1  Language- follow 3 step command 3  Language- read & follow direction 1  Write a sentence 1  Copy design 1  Total score 29        Immunization History  Administered Date(s) Administered  . Influenza Split 04/03/2012  . Influenza Whole 04/29/2006, 05/27/2007, 04/11/2008  . Influenza, High Dose Seasonal PF 04/04/2017  . Influenza,inj,Quad PF,6+ Mos 04/06/2013, 04/12/2014  . Influenza-Unspecified 04/06/2015, 03/18/2016  . Pneumococcal Conjugate-13 01/31/2015  . Pneumococcal Polysaccharide-23 07/16/1995, 08/12/2013  . Td 07/17/1992  . Tdap 11/26/2010  . Zoster 06/03/2012   Screening Tests Health  Maintenance  Topic Date Due  . DEXA SCAN  10/04/2018 (Originally 07/29/2001)  . TETANUS/TDAP  11/25/2020  . INFLUENZA VACCINE  Completed  . PNA vac Low Risk Adult  Completed        Plan:     An order was place for a tub bench due to patient's chronic bilateral knee and back pain to ensure patient's safety and help to increase her independence with ADLs.   Resource information was provided to patient to assist her with installing bathroom grab bars and to contact [facility] to further help to ensure patient's well being and safety.   Continue doing brain stimulating activities (puzzles, reading, adult coloring books, staying active) to keep memory sharp.   Continue to eat heart healthy diet (full of fruits, vegetables, whole grains, lean protein, water--limit salt, fat, and sugar intake) and increase physical activity as tolerated.  I have personally reviewed and noted the following in the patient's chart:   . Medical and social history . Use of alcohol, tobacco or illicit drugs  . Current medications and supplements . Functional ability and status . Nutritional status . Physical activity . Advanced directives . List of other physicians . Vitals . Screenings to include cognitive, depression, and falls . Referrals and appointments  In addition, I have reviewed and discussed with patient certain preventive protocols, quality metrics, and best practice recommendations. A written personalized care plan for preventive services as well as general preventive health recommendations were provided to patient.     Michiel Cowboy, RN  10/03/2017    Medical screening examination/treatment/procedure(s) were performed by non-physician practitioner and as supervising physician I was immediately available for consultation/collaboration. I agree with above. Binnie Rail, MD

## 2017-10-03 NOTE — Assessment & Plan Note (Signed)
Cbc, iron panel 

## 2017-10-03 NOTE — Assessment & Plan Note (Signed)
Following with orthopedics Has had infections, steroid - helped only minimally Uses a cane sedentary

## 2017-10-03 NOTE — Patient Instructions (Addendum)
Continue doing brain stimulating activities (puzzles, reading, adult coloring books, staying active) to keep memory sharp.   Continue to eat heart healthy diet (full of fruits, vegetables, whole grains, lean protein, water--limit salt, fat, and sugar intake) and increase physical activity as tolerated.   [name] , Thank you for taking time to come for your Medicare Wellness Visit. I appreciate your ongoing commitment to your health goals. Please review the following plan we discussed and let me know if I can assist you in the future.   These are the goals we discussed: Goals    . Patient Stated     Increase my physical activity by beginning to do chair exercises and progress to walking when I am able to do so. I will increase the amount of fluids and water that I drink daily.       This is a list of the screening recommended for you and due dates:  Health Maintenance  Topic Date Due  . DEXA scan (bone density measurement)  07/29/2001  . Tetanus Vaccine  11/25/2020  . Flu Shot  Completed  . Pneumonia vaccines  Completed    Lifeline: http://www.lifelinesys.com/content/home; 660-354-7271 [place] Peterman) https://[email or web address]/Mission-Projects/Local-Ideas/Construction-Projects/Wheelchair-Ramp-Construction  http://[place]-odom.com/  Wheelchair Ramp and [employer] for Shower     MedicationWarning.is    [phone]  Knee Exercises Ask your health care provider which exercises are safe for you. Do exercises exactly as told by your health care provider and adjust them as directed. It is normal to feel mild stretching, pulling, tightness, or discomfort as you do these exercises, but you should stop right away if you feel sudden pain or your pain gets worse.Do not begin these exercises until told by your health care provider. STRETCHING AND RANGE  OF MOTION EXERCISES These exercises warm up your muscles and joints and improve the movement and flexibility of your knee. These exercises also help to relieve pain, numbness, and tingling. Exercise A: Knee Extension, Prone 1. Lie on your abdomen on a bed. 2. Place your left / right knee just beyond the edge of the surface so your knee is not on the bed. You can put a towel under your left / right thigh just above your knee for comfort. 3. Relax your leg muscles and allow gravity to straighten your knee. You should feel a stretch behind your left / right knee. 4. Hold this position for __________ seconds. 5. Scoot up so your knee is supported between repetitions. Repeat __________ times. Complete this stretch __________ times a day. Exercise B: Knee Flexion, Active  1. Lie on your back with both knees straight. If this causes back discomfort, bend your left / right knee so your foot is flat on the floor. 2. Slowly slide your left / right heel back toward your buttocks until you feel a gentle stretch in the front of your knee or thigh. 3. Hold this position for __________ seconds. 4. Slowly slide your left / right heel back to the starting position. Repeat __________ times. Complete this exercise __________ times a day. Exercise C: Quadriceps, Prone  1. Lie on your abdomen on a firm surface, such as a bed or padded floor. 2. Bend your left / right knee and hold your ankle. If you cannot reach your ankle or pant leg, loop a belt around your foot and grab the belt instead. 3. Gently pull your heel toward your buttocks. Your knee should not slide out  to the side. You should feel a stretch in the front of your thigh and knee. 4. Hold this position for __________ seconds. Repeat __________ times. Complete this stretch __________ times a day. Exercise D: Hamstring, Supine 1. Lie on your back. 2. Loop a belt or towel over the ball of your left / right foot. The ball of your foot is on the walking  surface, right under your toes. 3. Straighten your left / right knee and slowly pull on the belt to raise your leg until you feel a gentle stretch behind your knee. ? Do not let your left / right knee bend while you do this. ? Keep your other leg flat on the floor. 4. Hold this position for __________ seconds. Repeat __________ times. Complete this stretch __________ times a day. STRENGTHENING EXERCISES These exercises build strength and endurance in your knee. Endurance is the ability to use your muscles for a long time, even after they get tired. Exercise E: Quadriceps, Isometric  1. Lie on your back with your left / right leg extended and your other knee bent. Put a rolled towel or small pillow under your knee if told by your health care provider. 2. Slowly tense the muscles in the front of your left / right thigh. You should see your kneecap slide up toward your hip or see increased dimpling just above the knee. This motion will push the back of the knee toward the floor. 3. For __________ seconds, keep the muscle as tight as you can without increasing your pain. 4. Relax the muscles slowly and completely. Repeat __________ times. Complete this exercise __________ times a day. Exercise F: Straight Leg Raises - Quadriceps 1. Lie on your back with your left / right leg extended and your other knee bent. 2. Tense the muscles in the front of your left / right thigh. You should see your kneecap slide up or see increased dimpling just above the knee. Your thigh may even shake a bit. 3. Keep these muscles tight as you raise your leg 4-6 inches (10-15 cm) off the floor. Do not let your knee bend. 4. Hold this position for __________ seconds. 5. Keep these muscles tense as you lower your leg. 6. Relax your muscles slowly and completely after each repetition. Repeat __________ times. Complete this exercise __________ times a day. Exercise G: Hamstring, Isometric 1. Lie on your back on a firm  surface. 2. Bend your left / right knee approximately __________ degrees. 3. Dig your left / right heel into the surface as if you are trying to pull it toward your buttocks. Tighten the muscles in the back of your thighs to dig as hard as you can without increasing any pain. 4. Hold this position for __________ seconds. 5. Release the tension gradually and allow your muscles to relax completely for __________ seconds after each repetition. Repeat __________ times. Complete this exercise __________ times a day. Exercise H: Hamstring Curls  If told by your health care provider, do this exercise while wearing ankle weights. Begin with __________ weights. Then increase the weight by 1 lb (0.5 kg) increments. Do not wear ankle weights that are more than __________. 1. Lie on your abdomen with your legs straight. 2. Bend your left / right knee as far as you can without feeling pain. Keep your hips flat against the floor. 3. Hold this position for __________ seconds. 4. Slowly lower your leg to the starting position.  Repeat __________ times. Complete this exercise __________ times  a day. Exercise I: Squats (Quadriceps) 1. Stand in front of a table, with your feet and knees pointing straight ahead. You may rest your hands on the table for balance but not for support. 2. Slowly bend your knees and lower your hips like you are going to sit in a chair. ? Keep your weight over your heels, not over your toes. ? Keep your lower legs upright so they are parallel with the table legs. ? Do not let your hips go lower than your knees. ? Do not bend lower than told by your health care provider. ? If your knee pain increases, do not bend as low. 3. Hold the squat position for __________ seconds. 4. Slowly push with your legs to return to standing. Do not use your hands to pull yourself to standing. Repeat __________ times. Complete this exercise __________ times a day. Exercise J: Wall Slides  (Quadriceps)  1. Lean your back against a smooth wall or door while you walk your feet out 18-24 inches (46-61 cm) from it. 2. Place your feet hip-width apart. 3. Slowly slide down the wall or door until your knees bend __________ degrees. Keep your knees over your heels, not over your toes. Keep your knees in line with your hips. 4. Hold for __________ seconds. Repeat __________ times. Complete this exercise __________ times a day. Exercise K: Straight Leg Raises - Hip Abductors 1. Lie on your side with your left / right leg in the top position. Lie so your head, shoulder, knee, and hip line up. You may bend your bottom knee to help you keep your balance. 2. Roll your hips slightly forward so your hips are stacked directly over each other and your left / right knee is facing forward. 3. Leading with your heel, lift your top leg 4-6 inches (10-15 cm). You should feel the muscles in your outer hip lifting. ? Do not let your foot drift forward. ? Do not let your knee roll toward the ceiling. 4. Hold this position for __________ seconds. 5. Slowly return your leg to the starting position. 6. Let your muscles relax completely after each repetition. Repeat __________ times. Complete this exercise __________ times a day. Exercise L: Straight Leg Raises - Hip Extensors 1. Lie on your abdomen on a firm surface. You can put a pillow under your hips if that is more comfortable. 2. Tense the muscles in your buttocks and lift your left / right leg about 4-6 inches (10-15 cm). Keep your knee straight as you lift your leg. 3. Hold this position for __________ seconds. 4. Slowly lower your leg to the starting position. 5. Let your leg relax completely after each repetition. Repeat __________ times. Complete this exercise __________ times a day. This information is not intended to replace advice given to you by your health care provider. Make sure you discuss any questions you have with your health care  provider. Document Released: 05/15/2005 Document Revised: 03/25/2016 Document Reviewed: 05/07/2015 Elsevier Interactive Patient Education  [employer]. [phone]

## 2017-10-03 NOTE — Assessment & Plan Note (Addendum)
BP well controlled at home - elevated here today - possibly from claritin D or chronic knee pain and deconditioning Will continue to monitor at home - goal < 140/90 Current regimen effective and well tolerated Continue current medications at current doses cmp

## 2017-10-03 NOTE — Assessment & Plan Note (Signed)
Check a1c Low sugar / carb diet Stressed regular exercise   

## 2017-10-03 NOTE — Assessment & Plan Note (Signed)
Check lipid panel  Continue daily statin Regular exercise and healthy diet encouraged  

## 2017-10-22 ENCOUNTER — Other Ambulatory Visit: Payer: Self-pay | Admitting: Internal Medicine

## 2017-12-20 ENCOUNTER — Other Ambulatory Visit: Payer: Self-pay | Admitting: Internal Medicine

## 2018-02-05 ENCOUNTER — Ambulatory Visit: Payer: Self-pay

## 2018-02-05 ENCOUNTER — Ambulatory Visit
Admission: RE | Admit: 2018-02-05 | Discharge: 2018-02-05 | Disposition: A | Discharge status: Home / Self Care | Payer: Medicare Other | Source: Ambulatory Visit | Attending: Internal Medicine | Admitting: Internal Medicine

## 2018-02-05 DIAGNOSIS — Z1231 Encounter for screening mammogram for malignant neoplasm of breast: Secondary | ICD-10-CM

## 2018-02-05 NOTE — Telephone Encounter (Signed)
Pt. Reports she has had several days of lower than normal BP readings. 01/19/18  118/62 PULSE 65, 02/02/18  110/60 pulse 65, today  102/60 pulse 66. No symptoms. Held medication this morning. Offered appointment for tomorrow - going out of town. Scheduled for next Tuesday as requested.Instructed if she develops symptoms while she is out of town, to go to [facility].Verbalizes understanding. Reason for Disposition . [9] Systolic BP 84-210 AND [3] taking blood pressure medications AND [3] NOT dizzy, lightheaded or weak  Answer Assessment - Initial Assessment Questions 1. BLOOD PRESSURE: "What is the blood pressure?" "Did you take at least two measurements 5 minutes apart?"     102/60 TODAY 2. ONSET: "When did you take your blood pressure?"     This morning 3. HOW: "How did you obtain the blood pressure?" (e.g., visiting nurse, automatic home BP monitor)     Home BP monitor 4. HISTORY: "Do you have a history of low blood pressure?" "What is your blood pressure normally?"     No 5. MEDICATIONS: "Are you taking any medications for blood pressure?" If yes: "Have they been changed recently?"     Yes 6. PULSE RATE: "Do you know what your pulse rate is?"      60's 7. OTHER SYMPTOMS: "Have you been sick recently?" "Have you had a recent injury?"     No 8. PREGNANCY: "Is there any chance you are pregnant?" "When was your last menstrual period?"     No  Protocols used: LOW BLOOD PRESSURE-A-AH

## 2018-02-10 ENCOUNTER — Encounter: Payer: Self-pay | Admitting: Family

## 2018-02-10 ENCOUNTER — Ambulatory Visit (INDEPENDENT_AMBULATORY_CARE_PROVIDER_SITE_OTHER): Payer: Medicare Other | Admitting: Family

## 2018-02-10 VITALS — BP 138/76 | HR 55 | Temp 97.5°F | Ht 65.0 in | Wt 247.0 lb

## 2018-02-10 DIAGNOSIS — I10 Essential (primary) hypertension: Secondary | ICD-10-CM | POA: Diagnosis not present

## 2018-02-10 NOTE — Progress Notes (Signed)
[name] is a 83 y.o. female with the following history as recorded in EpicCare:  Patient Active Problem List   Diagnosis Date Noted  . Chronic fatigue 08/07/2016  . DOE (dyspnea on exertion) 08/07/2016  . Chronic knee pain, bilateral 12/29/2015  . Prediabetes 10/13/2015  . Anxiety and depression 10/13/2015  . NONSPECIFIC ABNORMAL ELECTROCARDIOGRAM 08/25/2009  . LOW BACK PAIN, CHRONIC 11/20/2007  . Hyperlipidemia 06/23/2007  . Essential hypertension 06/23/2007  . Anemia 05/27/2007  . ALLERGIC RHINITIS 05/27/2007  . Intrinsic asthma 05/27/2007  . CAROTID BRUIT, LEFT 05/27/2007  . METABOLIC SYNDROME X 50/93/2671    Current Outpatient Medications  Medication Sig Dispense Refill  . albuterol (PROVENTIL HFA;VENTOLIN HFA) 108 (90 Base) MCG/ACT inhaler Inhale 2 puffs into the lungs every 6 (six) hours as needed for wheezing or shortness of breath. 3 Inhaler 1  . Alum Hydroxide-Mag Carbonate (GAVISCON PO) Take 1 Dose by mouth as directed.     [name] amLODipine (NORVASC) 5 MG tablet TAKE 1 TABLET BY MOUTH ONCE DAILY 90 tablet 3  . aspirin 81 MG tablet Take 81 mg by mouth daily.      . Aspirin-Acetaminophen-Caffeine (EXCEDRIN PO) Take 1 Dose by mouth as directed.     . furosemide (LASIX) 40 MG tablet TAKE 1 TABLET BY MOUTH ONCE DAILY 90 tablet 3  . loratadine (CLARITIN) 10 MG tablet Take 1 tablet (10 mg total) by mouth daily. 30 tablet 11  . LORazepam (ATIVAN) 1 MG tablet TAKE 1/2 TABLET BY MOUTH EVERY 8 HOURS AS NEEDED 30 tablet 0  . losartan (COZAAR) 100 MG tablet TAKE 1 TABLET BY MOUTH DAILY 90 tablet 3  . pravastatin (PRAVACHOL) 20 MG tablet Take 1 tablet (20 mg total) by mouth at bedtime. 90 tablet 1  . ranitidine (ZANTAC) 150 MG tablet Take 150 mg by mouth 2 (two) times daily as needed for heartburn.     . sertraline (ZOLOFT) 50 MG tablet TAKE 1 TABLET BY MOUTH ONCE DAILY 90 tablet 1   No current facility-administered medications for this visit.     Allergies: Latex  Past Medical  History:  Diagnosis Date  . Allergy   . Anemia   . Asthma    adult onset  . Carotid bruit    left  . Hyperlipidemia    LDL goal = < 120 based on NMR Lipoprofile 2005  . Hypertension   . Metabolic syndrome     Past Surgical History:  Procedure Laterality Date  . CARDIAC CATHETERIZATION  2006   negative  . CATARACT EXTRACTION, BILATERAL     Dr Bing Plume  . COLONOSCOPY  06/02/2006   Dr.Kaplan,Next Due date 06/02/2016  . SHOULDER SURGERY  11/21/2008   Dr.Aplington  . TOTAL ABDOMINAL HYSTERECTOMY W/ BILATERAL SALPINGOOPHORECTOMY  1983   Dr.Fore, for fibroids with pain  . TOTAL HIP ARTHROPLASTY  2005, 2006    Family History  Problem Relation Age of Onset  . Hypertension Father   . Stroke Father        > 50  . Hypertension Mother   . Arthritis Mother   . Hypertension Sister   . Diabetes Paternal Grandmother   . Heart attack Maternal Grandfather        age unknown; ? > 43  . Stomach cancer Maternal Grandmother     Social History   Tobacco Use  . Smoking status: Never Smoker  . Smokeless tobacco: Never Used  Substance Use Topics  . Alcohol use: No    Subjective:  Patient presents with concerns regarding recent fluctuations in her blood pressure readings; noticed in early July that she had 3 consecutive days of low blood pressure- averaging between 110-118/ 70; has continued to monitor and notes that has normalized since; admits was probably not drinking enough water; Denies any chest pain, shortness of breath, blurred vision or headache. Does bring in her home cuff for comparison and readings are consistent with what is seen in the office today;     Objective:  Vitals:   02/10/18 0950  BP: 138/76  Pulse: (!) 55  Temp: (!) 97.5 F (36.4 C)  TempSrc: Oral  SpO2: 96%  Weight: 247 lb (112 kg)  Height: 5\' 5"  (1.651 m)    General: Well developed, well nourished, in no acute distress  Skin : Warm and dry.  Head: Normocephalic and atraumatic  Lungs: Respirations  unlabored; clear to auscultation bilaterally without wheeze, rales, rhonchi  CVS exam: normal rate and regular rhythm.  Neurologic: Alert and oriented; speech intact; face symmetrical; moves all extremities well; CNII-XII intact without focal deficit   Assessment:  1. Essential hypertension     Plan:  Appears controlled; suspect she may have been mildly dehydrated on the days when the pressure was so low; in reviewing her log, do not feel that medication changes are warranted; reassurance/ patient will continue to monitor; keep planned follow-up with her PCP for September.   No follow-ups on file.  No orders of the defined types were placed in this encounter.   Requested Prescriptions    No prescriptions requested or ordered in this encounter

## 2018-03-13 ENCOUNTER — Other Ambulatory Visit: Payer: Self-pay | Admitting: Internal Medicine

## 2018-04-07 NOTE — Patient Instructions (Addendum)
  Tests ordered today. Your results will be released to MyChart (or called to you) after review, usually within 72hours after test completion. If any changes need to be made, you will be notified at that same time.  Flu immunization administered today.    Medications reviewed and updated.  Changes include :   none    Please followup in 6 months   

## 2018-04-07 NOTE — Progress Notes (Signed)
Subjective:    Patient ID: [name], female    DOB: July 17, 1934, 83 y.o.   MRN: [id number]  HPI The patient is here for follow up.  Right heel:  Two nights ago it was sore like it had something in it.  She did not feel anything in it with her hand when she felt the heel.  It has gotten better, but is still there.  She denies any new footwear, injuries or possibility of stepping on anything.  Skin tags - itching:  She has more skin tags around her neck and they itch.  She has couple on her side as well.    Generalized itch: she has itching all over.   This is not new and chronic.  She takes claritin but not daily.  She is unsure if it works.    Hypertension: She is taking her medication daily. She is compliant with a low sodium diet.  She denies chest pain, palpitations, edema, and regular headaches. She is not exercising regularly.      Hyperlipidemia: She is taking her medication daily. She is compliant with a low fat/cholesterol diet. She is not exercising regularly. She denies myalgias.   Prediabetes:  She is compliant with a low sugar/carbohydrate diet.  She is not exercising regularly.  Anxiety, depression: She is taking her medication daily as prescribed.  She also takes the lorazepam as needed only.  She denies any side effects from the medication. She feels her anxiety and depression are well controlled and she is happy with her current dose of medication.    Medications and allergies reviewed with patient and updated if appropriate.  Patient Active Problem List   Diagnosis Date Noted  . Chronic fatigue 08/07/2016  . DOE (dyspnea on exertion) 08/07/2016  . Chronic knee pain, bilateral 12/29/2015  . Prediabetes 10/13/2015  . Anxiety and depression 10/13/2015  . NONSPECIFIC ABNORMAL ELECTROCARDIOGRAM 08/25/2009  . LOW BACK PAIN, CHRONIC 11/20/2007  . Hyperlipidemia 06/23/2007  . Essential hypertension 06/23/2007  . Anemia 05/27/2007  . ALLERGIC RHINITIS 05/27/2007  .  Intrinsic asthma 05/27/2007  . CAROTID BRUIT, LEFT 05/27/2007  . METABOLIC SYNDROME X 42/35/3614    Current Outpatient Medications on File Prior to Visit  Medication Sig Dispense Refill  . albuterol (PROVENTIL HFA;VENTOLIN HFA) 108 (90 Base) MCG/ACT inhaler Inhale 2 puffs into the lungs every 6 (six) hours as needed for wheezing or shortness of breath. 3 Inhaler 1  . Alum Hydroxide-Mag Carbonate (GAVISCON PO) Take 1 Dose by mouth as directed.     [name] amLODipine (NORVASC) 5 MG tablet TAKE 1 TABLET BY MOUTH ONCE DAILY 90 tablet 3  . aspirin 81 MG tablet Take 81 mg by mouth daily.      . Aspirin-Acetaminophen-Caffeine (EXCEDRIN PO) Take 1 Dose by mouth as directed.     . furosemide (LASIX) 40 MG tablet TAKE 1 TABLET BY MOUTH ONCE DAILY 90 tablet 3  . loratadine (CLARITIN) 10 MG tablet Take 1 tablet (10 mg total) by mouth daily. 30 tablet 11  . LORazepam (ATIVAN) 1 MG tablet TAKE 1/2 TABLET BY MOUTH EVERY 8 HOURS AS NEEDED 30 tablet 0  . losartan (COZAAR) 100 MG tablet TAKE 1 TABLET BY MOUTH DAILY 90 tablet 3  . pravastatin (PRAVACHOL) 20 MG tablet TAKE 1 TABLET BY MOUTH AT BEDTIME 90 tablet 0  . sertraline (ZOLOFT) 50 MG tablet TAKE 1 TABLET BY MOUTH ONCE DAILY 90 tablet 1   No current facility-administered medications on file prior  to visit.     Past Medical History:  Diagnosis Date  . Allergy   . Anemia   . Asthma    adult onset  . Carotid bruit    left  . Hyperlipidemia    LDL goal = < 120 based on NMR Lipoprofile 2005  . Hypertension   . Metabolic syndrome     Past Surgical History:  Procedure Laterality Date  . CARDIAC CATHETERIZATION  2006   negative  . CATARACT EXTRACTION, BILATERAL     Dr Bing Plume  . COLONOSCOPY  06/02/2006   Dr.Kaplan,Next Due date 06/02/2016  . SHOULDER SURGERY  11/21/2008   Dr.Aplington  . TOTAL ABDOMINAL HYSTERECTOMY W/ BILATERAL SALPINGOOPHORECTOMY  1983   Dr.Fore, for fibroids with pain  . TOTAL HIP ARTHROPLASTY  2005, 2006    Social History    Socioeconomic History  . Marital status: Widowed    Spouse name: Not on file  . Number of children: Not on file  . Years of education: Not on file  . Highest education level: Not on file  Occupational History  . Not on file  Social Needs  . Financial resource strain: Not on file  . Food insecurity:    Worry: Not on file    Inability: Not on file  . Transportation needs:    Medical: Not on file    Non-medical: Not on file  Tobacco Use  . Smoking status: Never Smoker  . Smokeless tobacco: Never Used  Substance and Sexual Activity  . Alcohol use: No  . Drug use: No  . Sexual activity: Not on file  Lifestyle  . Physical activity:    Days per week: Not on file    Minutes per session: Not on file  . Stress: Not on file  Relationships  . Social connections:    Talks on phone: Not on file    Gets together: Not on file    Attends religious service: Not on file    Active member of club or organization: Not on file    Attends meetings of clubs or organizations: Not on file    Relationship status: Not on file  Other Topics Concern  . Not on file  Social History Narrative  . Not on file    Family History  Problem Relation Age of Onset  . Hypertension Father   . Stroke Father        > 21  . Hypertension Mother   . Arthritis Mother   . Hypertension Sister   . Diabetes Paternal Grandmother   . Heart attack Maternal Grandfather        age unknown; ? > 68  . Stomach cancer Maternal Grandmother     Review of Systems  Constitutional: Negative for chills and fever.  HENT: Positive for ear pain (right ear - intermittent) and sinus pressure (one week ago). Negative for congestion and sore throat.   Respiratory: Positive for shortness of breath (with exertion - chronic). Negative for cough and wheezing.   Cardiovascular: Negative for chest pain, palpitations and leg swelling.  Musculoskeletal:       Heel pain  Skin:       Skin tags, diffuse itching  Neurological: Negative  for light-headedness and headaches.       Objective:   Vitals:   04/08/18 0848  BP: 122/64  Pulse: (!) 53  Resp: 16  Temp: 98.3 F (36.8 C)  SpO2: 97%   BP Readings from Last 3 Encounters:  04/08/18 122/64  02/10/18 138/76  10/03/17 (!) 144/90   Wt Readings from Last 3 Encounters:  04/08/18 249 lb (112.9 kg)  02/10/18 247 lb (112 kg)  10/03/17 248 lb (112.5 kg)   Body mass index is 41.44 kg/m.   Physical Exam    Constitutional: Appears well-developed and well-nourished. No distress.  HENT:  Head: Normocephalic and atraumatic.  Neck: Neck supple. No tracheal deviation present. No thyromegaly present.  No cervical lymphadenopathy Cardiovascular: Normal rate, regular rhythm and normal heart sounds.   No murmur heard. No carotid bruit .  No edema Pulmonary/Chest: Effort normal and breath sounds normal. No respiratory distress. No has no wheezes. No rales.  Msk:  Right heel with dry skin - no redness, wounds, break in skin, mild tenderness posterior plantar aspect of heel Skin: Skin is warm and dry. Not diaphoretic.  Psychiatric: Normal mood and affect. Behavior is normal.      Assessment & Plan:    See Problem List for Assessment and Plan of chronic medical problems.

## 2018-04-08 ENCOUNTER — Encounter: Payer: Self-pay | Admitting: Internal Medicine

## 2018-04-08 ENCOUNTER — Ambulatory Visit (INDEPENDENT_AMBULATORY_CARE_PROVIDER_SITE_OTHER): Payer: Medicare Other | Admitting: Internal Medicine

## 2018-04-08 ENCOUNTER — Other Ambulatory Visit (INDEPENDENT_AMBULATORY_CARE_PROVIDER_SITE_OTHER): Payer: Medicare Other

## 2018-04-08 VITALS — BP 122/64 | HR 53 | Temp 98.3°F | Resp 16 | Ht 65.0 in | Wt 249.0 lb

## 2018-04-08 DIAGNOSIS — L299 Pruritus, unspecified: Secondary | ICD-10-CM | POA: Insufficient documentation

## 2018-04-08 DIAGNOSIS — E7849 Other hyperlipidemia: Secondary | ICD-10-CM | POA: Diagnosis not present

## 2018-04-08 DIAGNOSIS — M79671 Pain in right foot: Secondary | ICD-10-CM

## 2018-04-08 DIAGNOSIS — F419 Anxiety disorder, unspecified: Secondary | ICD-10-CM | POA: Diagnosis not present

## 2018-04-08 DIAGNOSIS — F32A Depression, unspecified: Secondary | ICD-10-CM

## 2018-04-08 DIAGNOSIS — I10 Essential (primary) hypertension: Secondary | ICD-10-CM

## 2018-04-08 DIAGNOSIS — R7303 Prediabetes: Secondary | ICD-10-CM

## 2018-04-08 DIAGNOSIS — Z23 Encounter for immunization: Secondary | ICD-10-CM

## 2018-04-08 DIAGNOSIS — M79673 Pain in unspecified foot: Secondary | ICD-10-CM | POA: Insufficient documentation

## 2018-04-08 DIAGNOSIS — F329 Major depressive disorder, single episode, unspecified: Secondary | ICD-10-CM

## 2018-04-08 LAB — COMPREHENSIVE METABOLIC PANEL
ALT: 8 U/L (ref 0–35)
AST: 11 U/L (ref 0–37)
Albumin: 4 g/dL (ref 3.5–5.2)
Alkaline Phosphatase: 129 U/L — ABNORMAL HIGH (ref 39–117)
BILIRUBIN TOTAL: 0.8 mg/dL (ref 0.2–1.2)
BUN: 22 mg/dL (ref 6–23)
CALCIUM: 9.5 mg/dL (ref 8.4–10.5)
CHLORIDE: 104 meq/L (ref 96–112)
CO2: 29 mEq/L (ref 19–32)
CREATININE: 1.16 mg/dL (ref 0.40–1.20)
GFR: 57.56 mL/min — ABNORMAL LOW (ref >60.00)
Glucose, Bld: 142 mg/dL — ABNORMAL HIGH (ref 70–99)
Potassium: 4.3 mEq/L (ref 3.5–5.1)
SODIUM: 139 meq/L (ref 135–145)
Total Protein: 7.4 g/dL (ref 6.0–8.3)

## 2018-04-08 LAB — CBC WITH DIFFERENTIAL/PLATELET
BASOS ABS: 0.1 10*3/uL (ref 0.0–0.1)
Basophils Relative: 0.9 % (ref 0.0–3.0)
EOS ABS: 0.2 10*3/uL (ref 0.0–0.7)
Eosinophils Relative: 3 % (ref 0.0–5.0)
HCT: 33.8 % — ABNORMAL LOW (ref 36.0–46.0)
Hemoglobin: 11.1 g/dL — ABNORMAL LOW (ref 12.0–15.0)
Lymphocytes Relative: 15.5 % (ref 12.0–46.0)
Lymphs Abs: 1.3 10*3/uL (ref 0.7–4.0)
MCHC: 32.7 g/dL (ref 30.0–36.0)
MCV: 93.9 fl (ref 78.0–100.0)
MONO ABS: 0.6 10*3/uL (ref 0.1–1.0)
Monocytes Relative: 6.9 % (ref 3.0–12.0)
NEUTROS ABS: 6.2 10*3/uL (ref 1.4–7.7)
Neutrophils Relative %: 73.7 % (ref 43.0–77.0)
PLATELETS: 288 10*3/uL (ref 150.0–400.0)
RBC: 3.6 Mil/uL — ABNORMAL LOW (ref 3.87–5.11)
RDW: 11.4 % — ABNORMAL LOW (ref 11.5–15.5)
WBC: 8.4 10*3/uL (ref 4.0–10.5)

## 2018-04-08 LAB — LIPID PANEL
CHOL/HDL RATIO: 4
CHOLESTEROL: 148 mg/dL (ref 0–200)
HDL: 33 mg/dL — ABNORMAL LOW (ref >39.00)
LDL Cholesterol: 100 mg/dL — ABNORMAL HIGH (ref 0–99)
NonHDL: 114.75
TRIGLYCERIDES: 75 mg/dL (ref 0.0–149.0)
VLDL: 15 mg/dL (ref 0.0–40.0)

## 2018-04-08 LAB — HEMOGLOBIN A1C: Hgb A1c MFr Bld: 6.2 % (ref 4.6–6.5)

## 2018-04-08 MED ORDER — LORAZEPAM 1 MG PO TABS: ORAL_TABLET | ORAL | 0 refills | Status: DC

## 2018-04-08 NOTE — Assessment & Plan Note (Signed)
Check lipid panel  Continue daily statin Regular exercise and healthy diet encouraged  

## 2018-04-08 NOTE — Assessment & Plan Note (Signed)
The pain started two days ago and it has improved No evidence of injury or wound Dry skin - no skin breaks Getting better - just monitor for now

## 2018-04-08 NOTE — Assessment & Plan Note (Signed)
Check a1c Low sugar / carb diet Stressed regular exercise, weight loss  

## 2018-04-08 NOTE — Assessment & Plan Note (Signed)
Generalized, no known cause ?  Secondary to medication Discussed that she can try taking the Claritin daily to see if that helps And see dermatology Discussed that we can change of her medications see if that helps, but unsure which medication is the culprit

## 2018-04-08 NOTE — Assessment & Plan Note (Signed)
BP well controlled Current regimen effective and well tolerated Continue current medications at current doses cmp  

## 2018-04-08 NOTE — Assessment & Plan Note (Signed)
Well-controlled, stable Continue sertraline at current dose daily Continue lorazepam as needed only, which she does not take it often

## 2018-04-11 ENCOUNTER — Other Ambulatory Visit: Payer: Self-pay | Admitting: Internal Medicine

## 2018-06-20 ENCOUNTER — Other Ambulatory Visit: Payer: Self-pay | Admitting: Internal Medicine

## 2018-07-03 ENCOUNTER — Ambulatory Visit (INDEPENDENT_AMBULATORY_CARE_PROVIDER_SITE_OTHER): Payer: Medicare Other | Admitting: Internal Medicine

## 2018-07-03 ENCOUNTER — Encounter: Payer: Self-pay | Admitting: Internal Medicine

## 2018-07-03 DIAGNOSIS — J4521 Mild intermittent asthma with (acute) exacerbation: Secondary | ICD-10-CM | POA: Diagnosis not present

## 2018-07-03 MED ORDER — PREDNISONE 50 MG PO TABS: 50.0000 mg | ORAL_TABLET | Freq: Every day | ORAL | 0 refills | Status: DC

## 2018-07-03 MED ORDER — FLUTICASONE PROPIONATE 50 MCG/ACT NA SUSP: 2.0000 | Freq: Every day | NASAL | 6 refills | Status: DC

## 2018-07-03 NOTE — Assessment & Plan Note (Signed)
With flare today and rx for prednisone 5 day course. She has completed antibiotics and does not need another course. Rx for flonase which she is not taking now. Continue claritin daily.

## 2018-07-03 NOTE — Progress Notes (Signed)
   Subjective:   Patient ID: [name], female    DOB: [birth year], 83 y.o.   MRN: [id number]  HPI The patient is an 83 YO female coming in for breathing problems. She was seen and treated for infection with antibiotic and prednisone. Finished prednisone 2 days ago and antibiotic yesterday. She is still having SOB and using albuterol often. She is having mild nose drainage. Denies sinus pressure or ear pain. Some chills but no fevers. Overall is improving some with the medicines but not well. She is very concerned as she has had asthma problems which landed her in the hospital and she is not improving as expected. Not taking anything otc at this time.   Review of Systems  Constitutional: Positive for activity change, appetite change, chills and fatigue. Negative for fever and unexpected weight change.  HENT: Positive for congestion, postnasal drip and rhinorrhea. Negative for ear discharge, ear pain, sinus pressure, sinus pain, sneezing, sore throat, tinnitus, trouble swallowing and voice change.   Eyes: Negative.   Respiratory: Positive for cough, shortness of breath and wheezing. Negative for chest tightness.   Cardiovascular: Negative.   Gastrointestinal: Negative.   Musculoskeletal: Negative.   Neurological: Negative.     Objective:  Physical Exam Constitutional:      Appearance: She is well-developed.  HENT:     Head: Normocephalic and atraumatic.     Comments: Oropharynx with redness and clear drainage, nose with swollen turbinates, TMs normal bilaterally.  Neck:     Musculoskeletal: Normal range of motion.     Thyroid: No thyromegaly.  Cardiovascular:     Rate and Rhythm: Normal rate and regular rhythm.  Pulmonary:     Effort: Pulmonary effort is normal. No respiratory distress.     Breath sounds: Wheezing present. No rales.     Comments: Minimal wheezing on exam Abdominal:     Palpations: Abdomen is soft.  Musculoskeletal:        General: No tenderness.  Lymphadenopathy:      Cervical: No cervical adenopathy.  Skin:    General: Skin is warm and dry.  Neurological:     Mental Status: She is alert and oriented to person, place, and time.     Vitals:   07/03/18 1610  BP: 140/70  Pulse: 62  Temp: 98.1 F (36.7 C)  TempSrc: Oral  SpO2: 97%  Weight: 244 lb (110.7 kg)  Height: 5\' 5"  (1.651 m)    Assessment & Plan:

## 2018-07-03 NOTE — Patient Instructions (Signed)
We have sent in prednisone to take 1 pill daily for 5 days.   We have also sent in flonase nose spray so use 2 sprays in each nostril daily for 1-2 weeks.

## 2018-07-06 ENCOUNTER — Other Ambulatory Visit: Payer: Self-pay | Admitting: Internal Medicine

## 2018-07-06 MED ORDER — LORAZEPAM 0.5 MG PO TABS: 0.5000 mg | ORAL_TABLET | Freq: Every day | ORAL | 0 refills | Status: DC | PRN

## 2018-07-06 NOTE — Telephone Encounter (Signed)
Check [place] registry last filled 04/08/2018. MD out of the office pls advise on refill.Marland KitchenJohny Chess

## 2018-07-06 NOTE — Telephone Encounter (Signed)
Sig states 1/2 pill every 8 hours as needed. Will adjust dose to appropriate amount for 1 pill daily prn #30 no refills as last filled 3 months ago.

## 2018-07-09 ENCOUNTER — Other Ambulatory Visit: Payer: Self-pay | Admitting: Internal Medicine

## 2018-07-09 MED ORDER — ALBUTEROL SULFATE HFA 108 (90 BASE) MCG/ACT IN AERS: 2.0000 | INHALATION_SPRAY | Freq: Four times a day (QID) | RESPIRATORY_TRACT | 0 refills | Status: DC | PRN

## 2018-07-09 NOTE — Telephone Encounter (Signed)
Copied from [place] [phone]. Topic: Quick Communication - Rx Refill/Question >> Jul 09, 2018  4:50 PM Sheran Luz wrote: Medication: albuterol (PROVENTIL HFA;VENTOLIN HFA) 108 (90 Base) MCG/ACT inhaler   Patient is requesting a refill of this medication.   Preferred Pharmacy (with phone number or street name):[facility] [place], [address] AT [address] [phone] (Phone) [phone] (Fax)

## 2018-07-17 ENCOUNTER — Ambulatory Visit: Payer: Self-pay

## 2018-07-17 MED ORDER — AZITHROMYCIN 250 MG PO TABS: ORAL_TABLET | ORAL | 0 refills | Status: DC

## 2018-07-17 NOTE — Telephone Encounter (Signed)
I can call in an antibiotic -we can try a zpak if she is ok with that

## 2018-07-17 NOTE — Telephone Encounter (Signed)
Pt. Reports she was just seen 07/03/18 with productive cough. Reports she was a "little better on the steroid.The cough never went away though."Reports mucus she is coughing up is now light yellow.Using her inhaler more. No wheezing. No fever. No availability in office today. States "I'm a little nervous because it's the weekend and I need something for this cough with my asthma." Request medication be sent to her pharmacy - [facility] at [address]. Please advise pt.  Answer Assessment - Initial Assessment Questions 1. ONSET: "When did the cough begin?"      Started 3-4 weeks 2. SEVERITY: "How bad is the cough today?"      Severe 3. RESPIRATORY DISTRESS: "Describe your breathing."      Moderate 4. FEVER: "Do you have a fever?" If so, ask: "What is your temperature, how was it measured, and when did it start?"     No 5. SPUTUM: "Describe the color of your sputum" (clear, white, yellow, green)     Light yellow 6. HEMOPTYSIS: "Are you coughing up any blood?" If so ask: "How much?" (flecks, streaks, tablespoons, etc.)     No 7. CARDIAC HISTORY: "Do you have any history of heart disease?" (e.g., heart attack, congestive heart failure)      HTN 8. LUNG HISTORY: "Do you have any history of lung disease?"  (e.g., pulmonary embolus, asthma, emphysema)     ASTHMA 9. PE RISK FACTORS: "Do you have a history of blood clots?" (or: recent major surgery, recent prolonged travel, bedridden)     No 10. OTHER SYMPTOMS: "Do you have any other symptoms?" (e.g., runny nose, wheezing, chest pain)       No 11. PREGNANCY: "Is there any chance you are pregnant?" "When was your last menstrual period?"       n/a 12. TRAVEL: "Have you traveled out of the country in the last month?" (e.g., travel history, exposures)       No  Protocols used: [place]

## 2018-07-17 NOTE — Addendum Note (Signed)
Addended by: Binnie Rail on: 07/17/2018 11:47 AM   Modules accepted: Orders

## 2018-07-17 NOTE — Telephone Encounter (Signed)
Pt ok with zpak. [facility] on [address]

## 2018-10-04 ENCOUNTER — Other Ambulatory Visit: Payer: Self-pay | Admitting: Internal Medicine

## 2018-10-07 ENCOUNTER — Ambulatory Visit: Payer: Medicare Other | Admitting: Internal Medicine

## 2018-12-08 ENCOUNTER — Ambulatory Visit: Payer: Medicare Other | Admitting: Internal Medicine

## 2018-12-18 ENCOUNTER — Other Ambulatory Visit: Payer: Self-pay | Admitting: Internal Medicine

## 2018-12-19 NOTE — Progress Notes (Signed)
Subjective:    Patient ID: [name], female    DOB: 1935/03/09, 84 y.o.   MRN: [id number]  HPI The patient is here for follow up.  She is not exercising regularly.   She has been home mostly with COVID pandemic.  She likes to read and do word search puzzles.  She has no energy.  She denies any changes in medications.    Her depression is well controlled.  Hypertension: She is taking her medication daily. She is compliant with a low sodium diet.  She denies chest pain, palpitations, edema  and regular headaches. She does monitor her blood pressure at home.    Hyperlipidemia: She is taking her medication daily. She is compliant with a low fat/cholesterol diet. She denies myalgias.   Prediabetes:  She is compliant with a low sugar/carbohydrate diet.  She is not exercising regularly.  Anxiety, Depression: She is taking her medication daily as prescribed.  She takes lorazepam as needed.  She denies any side effects from the medication. She feels her depression and anxiety are well controlled and she is happy with her current dose of medication.    B/l knee pain from OA:  She sees orthopedics as needed.  She has had injections in both knees once and it did not last long.  She takes advil as needed and it helps.    Right lower back pain:  It started in March.  She started having spasms and then developed muscle pain in her right psoas.  The pain improved, but is still sore - she feels it with moving certain ways or laughing.  It is slowly getting better.     Medications and allergies reviewed with patient and updated if appropriate.  Patient Active Problem List   Diagnosis Date Noted  . Pain in heel 04/08/2018  . Pruritus 04/08/2018  . Chronic fatigue 08/07/2016  . DOE (dyspnea on exertion) 08/07/2016  . Chronic knee pain, bilateral 12/29/2015  . Prediabetes 10/13/2015  . Anxiety and depression 10/13/2015  . NONSPECIFIC ABNORMAL ELECTROCARDIOGRAM 08/25/2009  . LOW BACK PAIN,  CHRONIC 11/20/2007  . Hyperlipidemia 06/23/2007  . Essential hypertension 06/23/2007  . Anemia 05/27/2007  . ALLERGIC RHINITIS 05/27/2007  . Asthma 05/27/2007  . CAROTID BRUIT, LEFT 05/27/2007  . METABOLIC SYNDROME X 02/54/2706    Current Outpatient Medications on File Prior to Visit  Medication Sig Dispense Refill  . albuterol (PROVENTIL HFA;VENTOLIN HFA) 108 (90 Base) MCG/ACT inhaler Inhale 2 puffs into the lungs every 6 (six) hours as needed for wheezing or shortness of breath. 3 Inhaler 0  . Alum Hydroxide-Mag Carbonate (GAVISCON PO) Take 1 Dose by mouth as directed.     [name] amLODipine (NORVASC) 5 MG tablet TAKE 1 TABLET BY MOUTH DAILY 90 tablet 0  . aspirin 81 MG tablet Take 81 mg by mouth daily.      . Aspirin-Acetaminophen-Caffeine (EXCEDRIN PO) Take 1 Dose by mouth as directed.     . fluticasone (FLONASE) 50 MCG/ACT nasal spray Place 2 sprays into both nostrils daily. 16 g 6  . furosemide (LASIX) 40 MG tablet TAKE 1 TABLET BY MOUTH DAILY 90 tablet 0  . loratadine (CLARITIN) 10 MG tablet Take 1 tablet (10 mg total) by mouth daily. 30 tablet 11  . LORazepam (ATIVAN) 0.5 MG tablet Take 1 tablet (0.5 mg total) by mouth daily as needed for anxiety. 30 tablet 0  . losartan (COZAAR) 100 MG tablet TAKE 1 TABLET BY MOUTH EVERY DAY 90 tablet  0  . pravastatin (PRAVACHOL) 20 MG tablet TAKE 1 TABLET BY MOUTH AT BEDTIME 90 tablet 1  . sertraline (ZOLOFT) 50 MG tablet TAKE 1 TABLET BY MOUTH EVERY DAY 90 tablet 0   No current facility-administered medications on file prior to visit.     Past Medical History:  Diagnosis Date  . Allergy   . Anemia   . Asthma    adult onset  . Carotid bruit    left  . Hyperlipidemia    LDL goal = < 120 based on NMR Lipoprofile 2005  . Hypertension   . Metabolic syndrome     Past Surgical History:  Procedure Laterality Date  . CARDIAC CATHETERIZATION  2006   negative  . CATARACT EXTRACTION, BILATERAL     Dr Bing Plume  . COLONOSCOPY  06/02/2006    Dr.Kaplan,Next Due date 06/02/2016  . SHOULDER SURGERY  11/21/2008   Dr.Aplington  . TOTAL ABDOMINAL HYSTERECTOMY W/ BILATERAL SALPINGOOPHORECTOMY  1983   Dr.Fore, for fibroids with pain  . TOTAL HIP ARTHROPLASTY  2005, 2006    Social History   Socioeconomic History  . Marital status: Widowed    Spouse name: Not on file  . Number of children: Not on file  . Years of education: Not on file  . Highest education level: Not on file  Occupational History  . Not on file  Social Needs  . Financial resource strain: Not on file  . Food insecurity:    Worry: Not on file    Inability: Not on file  . Transportation needs:    Medical: Not on file    Non-medical: Not on file  Tobacco Use  . Smoking status: Never Smoker  . Smokeless tobacco: Never Used  Substance and Sexual Activity  . Alcohol use: No  . Drug use: No  . Sexual activity: Not on file  Lifestyle  . Physical activity:    Days per week: Not on file    Minutes per session: Not on file  . Stress: Not on file  Relationships  . Social connections:    Talks on phone: Not on file    Gets together: Not on file    Attends religious service: Not on file    Active member of club or organization: Not on file    Attends meetings of clubs or organizations: Not on file    Relationship status: Not on file  Other Topics Concern  . Not on file  Social History Narrative  . Not on file    Family History  Problem Relation Age of Onset  . Hypertension Father   . Stroke Father        > 53  . Hypertension Mother   . Arthritis Mother   . Hypertension Sister   . Diabetes Paternal Grandmother   . Heart attack Maternal Grandfather        age unknown; ? > 61  . Stomach cancer Maternal Grandmother     Review of Systems  Constitutional: Positive for fatigue (no energy). Negative for chills and fever.  Respiratory: Positive for shortness of breath (chronic with exertion). Negative for cough and wheezing.   Cardiovascular: Negative  for chest pain, palpitations and leg swelling.  Gastrointestinal: Negative for abdominal pain, constipation, diarrhea and nausea.  Musculoskeletal: Positive for arthralgias and back pain.  Neurological: Negative for light-headedness and headaches.  Psychiatric/Behavioral: Positive for dysphoric mood (controlled). The patient is nervous/anxious (controlled).        Objective:   Vitals:  12/21/18 1023  BP: (!) 142/82  Pulse: 61  Resp: 16  Temp: 98.6 F (37 C)  SpO2: 96%   BP Readings from Last 3 Encounters:  12/21/18 (!) 142/82  07/03/18 140/70  04/08/18 122/64   Wt Readings from Last 3 Encounters:  12/21/18 241 lb 12.8 oz (109.7 kg)  07/03/18 244 lb (110.7 kg)  04/08/18 249 lb (112.9 kg)   Body mass index is 40.24 kg/m.   Physical Exam    Constitutional: Appears well-developed and well-nourished. No distress.  HENT:  Head: Normocephalic and atraumatic.  Neck: Neck supple. No tracheal deviation present. No thyromegaly present.  No cervical lymphadenopathy Cardiovascular: Normal rate, regular rhythm and normal heart sounds.   No murmur heard. No carotid bruit .  No edema Pulmonary/Chest: Effort normal and breath sounds normal. No respiratory distress. No has no wheezes. No rales.  Skin: Skin is warm and dry. Not diaphoretic.  Psychiatric: Normal mood and affect. Behavior is normal.      Assessment & Plan:    See Problem List for Assessment and Plan of chronic medical problems.

## 2018-12-19 NOTE — Patient Instructions (Addendum)

## 2018-12-21 ENCOUNTER — Encounter: Payer: Self-pay | Admitting: Internal Medicine

## 2018-12-21 ENCOUNTER — Other Ambulatory Visit: Payer: Self-pay

## 2018-12-21 ENCOUNTER — Ambulatory Visit (INDEPENDENT_AMBULATORY_CARE_PROVIDER_SITE_OTHER): Payer: Medicare Other | Admitting: Internal Medicine

## 2018-12-21 ENCOUNTER — Other Ambulatory Visit (INDEPENDENT_AMBULATORY_CARE_PROVIDER_SITE_OTHER): Payer: Medicare Other

## 2018-12-21 VITALS — BP 142/82 | HR 61 | Temp 98.6°F | Resp 16 | Ht 65.0 in | Wt 241.8 lb

## 2018-12-21 DIAGNOSIS — M25562 Pain in left knee: Secondary | ICD-10-CM

## 2018-12-21 DIAGNOSIS — G8929 Other chronic pain: Secondary | ICD-10-CM

## 2018-12-21 DIAGNOSIS — R5382 Chronic fatigue, unspecified: Secondary | ICD-10-CM

## 2018-12-21 DIAGNOSIS — I10 Essential (primary) hypertension: Secondary | ICD-10-CM

## 2018-12-21 DIAGNOSIS — J4521 Mild intermittent asthma with (acute) exacerbation: Secondary | ICD-10-CM

## 2018-12-21 DIAGNOSIS — F329 Major depressive disorder, single episode, unspecified: Secondary | ICD-10-CM

## 2018-12-21 DIAGNOSIS — E7849 Other hyperlipidemia: Secondary | ICD-10-CM | POA: Diagnosis not present

## 2018-12-21 DIAGNOSIS — R7303 Prediabetes: Secondary | ICD-10-CM | POA: Diagnosis not present

## 2018-12-21 DIAGNOSIS — F419 Anxiety disorder, unspecified: Secondary | ICD-10-CM

## 2018-12-21 DIAGNOSIS — M545 Low back pain: Secondary | ICD-10-CM

## 2018-12-21 DIAGNOSIS — M25561 Pain in right knee: Secondary | ICD-10-CM

## 2018-12-21 LAB — CBC WITH DIFFERENTIAL/PLATELET
Basophils Absolute: 0.1 10*3/uL (ref 0.0–0.1)
Basophils Relative: 1.3 % (ref 0.0–3.0)
Eosinophils Absolute: 0.2 10*3/uL (ref 0.0–0.7)
Eosinophils Relative: 2.4 % (ref 0.0–5.0)
HCT: 34.6 % — ABNORMAL LOW (ref 36.0–46.0)
Hemoglobin: 11.4 g/dL — ABNORMAL LOW (ref 12.0–15.0)
Lymphocytes Relative: 17.7 % (ref 12.0–46.0)
Lymphs Abs: 1.6 10*3/uL (ref 0.7–4.0)
MCHC: 33.1 g/dL (ref 30.0–36.0)
MCV: 94.3 fl (ref 78.0–100.0)
Monocytes Absolute: 0.6 10*3/uL (ref 0.1–1.0)
Monocytes Relative: 6.3 % (ref 3.0–12.0)
Neutro Abs: 6.6 10*3/uL (ref 1.4–7.7)
Neutrophils Relative %: 72.3 % (ref 43.0–77.0)
Platelets: 304 10*3/uL (ref 150.0–400.0)
RBC: 3.66 Mil/uL — ABNORMAL LOW (ref 3.87–5.11)
RDW: 12.1 % (ref 11.5–15.5)
WBC: 9.1 10*3/uL (ref 4.0–10.5)

## 2018-12-21 LAB — LIPID PANEL
Cholesterol: 156 mg/dL (ref 0–200)
HDL: 35.2 mg/dL — ABNORMAL LOW (ref >39.00)
LDL Cholesterol: 105 mg/dL — ABNORMAL HIGH (ref 0–99)
NonHDL: 120.98
Total CHOL/HDL Ratio: 4
Triglycerides: 78 mg/dL (ref 0.0–149.0)
VLDL: 15.6 mg/dL (ref 0.0–40.0)

## 2018-12-21 LAB — COMPREHENSIVE METABOLIC PANEL
ALT: 7 U/L (ref 0–35)
AST: 11 U/L (ref 0–37)
Albumin: 4.1 g/dL (ref 3.5–5.2)
Alkaline Phosphatase: 130 U/L — ABNORMAL HIGH (ref 39–117)
BUN: 21 mg/dL (ref 6–23)
CO2: 29 mEq/L (ref 19–32)
Calcium: 9.5 mg/dL (ref 8.4–10.5)
Chloride: 104 mEq/L (ref 96–112)
Creatinine, Ser: 1.06 mg/dL (ref 0.40–1.20)
GFR: 59.99 mL/min — ABNORMAL LOW (ref >60.00)
Glucose, Bld: 139 mg/dL — ABNORMAL HIGH (ref 70–99)
Potassium: 4.1 mEq/L (ref 3.5–5.1)
Sodium: 140 mEq/L (ref 135–145)
Total Bilirubin: 1 mg/dL (ref 0.2–1.2)
Total Protein: 7.5 g/dL (ref 6.0–8.3)

## 2018-12-21 LAB — TSH: TSH: 3.17 u[IU]/mL (ref 0.35–4.50)

## 2018-12-21 LAB — HEMOGLOBIN A1C: Hgb A1c MFr Bld: 6.3 % (ref 4.6–6.5)

## 2018-12-21 MED ORDER — ALBUTEROL SULFATE HFA 108 (90 BASE) MCG/ACT IN AERS: 2.0000 | INHALATION_SPRAY | Freq: Four times a day (QID) | RESPIRATORY_TRACT | 0 refills | Status: DC | PRN

## 2018-12-21 MED ORDER — PRAVASTATIN SODIUM 20 MG PO TABS: 20.0000 mg | ORAL_TABLET | Freq: Every day | ORAL | 1 refills | Status: DC

## 2018-12-21 MED ORDER — FLUTICASONE PROPIONATE 50 MCG/ACT NA SUSP: 2.0000 | Freq: Every day | NASAL | 3 refills | Status: DC

## 2018-12-21 MED ORDER — SERTRALINE HCL 50 MG PO TABS: 50.0000 mg | ORAL_TABLET | Freq: Every day | ORAL | 1 refills | Status: DC

## 2018-12-21 MED ORDER — LORAZEPAM 0.5 MG PO TABS: 0.5000 mg | ORAL_TABLET | Freq: Every day | ORAL | 0 refills | Status: DC | PRN

## 2018-12-21 MED ORDER — LOSARTAN POTASSIUM 100 MG PO TABS: 100.0000 mg | ORAL_TABLET | Freq: Every day | ORAL | 1 refills | Status: DC

## 2018-12-21 MED ORDER — AMLODIPINE BESYLATE 5 MG PO TABS: 5.0000 mg | ORAL_TABLET | Freq: Every day | ORAL | 1 refills | Status: DC

## 2018-12-21 NOTE — Assessment & Plan Note (Signed)
Check lipid panel  Continue daily statin Regular exercise and healthy diet encouraged  

## 2018-12-21 NOTE — Assessment & Plan Note (Signed)
Right-sided back-muscular in nature It is slowly improving and she does not feel that she needs any further evaluation or treatment at this time Continue symptomatic treatment

## 2018-12-21 NOTE — Assessment & Plan Note (Signed)
Due to OA, severe Follows with ortho Has had steroid injections x 1 - did ont help much Would consider the gel injections Will f/u with orthopedics

## 2018-12-21 NOTE — Assessment & Plan Note (Signed)
Anxiety and depression well-controlled Continue sertraline 50 mg daily Continue lorazepam as needed, which she does not take often

## 2018-12-21 NOTE — Assessment & Plan Note (Signed)
Fatigue for several months This has been a complaint in the past Denies any anxiety or depression Sedentary, obese-discussed that these 2 factors are likely contributing May need to consider evaluation for sleep apnea-sleep apnea or none of sleep could be contributing Check blood work including CBC, CMP, TSH

## 2018-12-21 NOTE — Assessment & Plan Note (Signed)
Check a1c Low sugar / carb diet encouraged regular exercise

## 2018-12-21 NOTE — Assessment & Plan Note (Signed)
Mild, intermittent She uses her inhaler on occasion Overall controlled

## 2018-12-21 NOTE — Assessment & Plan Note (Signed)
BP well controlled Current regimen effective and well tolerated Continue current medications at current doses cmp  

## 2018-12-30 ENCOUNTER — Other Ambulatory Visit: Payer: Self-pay | Admitting: Internal Medicine

## 2019-01-12 ENCOUNTER — Ambulatory Visit: Payer: Self-pay | Admitting: *Deleted

## 2019-01-12 NOTE — Telephone Encounter (Signed)
Summary: BP follow up questions    Patient called stating her BP is 96/50. Patient does have a "nagging headache" Last time she did take BP was Friday. 136/61 Patient called to see if she needed to do anything and would like clinical advise.  Call back # [phone]      She took her b/p this afternoon at 4:15 it was 96/50 and then it was 120/58 and now it is 120/67. She stated that she has not been drinking as much water as before. She denies dizziness, weakness, no chest pain or shortness of breath that is not new. Advised to keep monitoring her b/p and let the provider know what the readings are. Advised to call back for any shortness of breath, weakness, chest pain, fever, nausea or vomiting. Pt voiced understanding. Routing to [facility] at [facility] for review. Reason for Disposition . [8] Systolic BP 75-643 AND [3] taking blood pressure medications AND [3] NOT dizzy, lightheaded or weak  Answer Assessment - Initial Assessment Questions 1. BLOOD PRESSURE: "What is the blood pressure?" "Did you take at least two measurements 5 minutes apart?"     96/50, 120/58, 120/67 2. ONSET: "When did you take your blood pressure?"     This afternoon 3. HOW: "How did you obtain the blood pressure?" (e.g., visiting nurse, automatic home BP monitor)     Automatic home BP monitor 4. HISTORY: "Do you have a history of low blood pressure?" "What is your blood pressure normally?"     no 5. MEDICATIONS: "Are you taking any medications for blood pressure?" If yes: "Have they been changed recently?"     Yes have not missed any doses 6. PULSE RATE: "Do you know what your pulse rate is?"      HR 64 7. OTHER SYMPTOMS: "Have you been sick recently?" "Have you had a recent injury?"     no 8. PREGNANCY: "Is there any chance you are pregnant?" "When was your last menstrual period?"     no  Protocols used: LOW BLOOD PRESSURE-A-AH

## 2019-01-13 NOTE — Telephone Encounter (Signed)
Spoke with pt to let her know that she should be seen. She stated that her BP was better this morning and her headache has subsided. Thinks she was just dehydration and has been drinking a lot of water and noticed her BP has been better since. Chose to hold off on visit for now.   7:14 am- 129/69

## 2019-01-13 NOTE — Telephone Encounter (Signed)
noted 

## 2019-01-13 NOTE — Telephone Encounter (Signed)
Schedule a visit

## 2019-01-13 NOTE — Telephone Encounter (Signed)
Would you like her to come in for a visit?

## 2019-02-15 ENCOUNTER — Other Ambulatory Visit: Payer: Self-pay | Admitting: Internal Medicine

## 2019-02-15 DIAGNOSIS — Z1231 Encounter for screening mammogram for malignant neoplasm of breast: Secondary | ICD-10-CM

## 2019-02-18 ENCOUNTER — Other Ambulatory Visit: Payer: Self-pay

## 2019-02-18 ENCOUNTER — Ambulatory Visit
Admission: RE | Admit: 2019-02-18 | Discharge: 2019-02-18 | Disposition: A | Discharge status: Home / Self Care | Payer: Medicare Other | Source: Ambulatory Visit | Attending: Internal Medicine | Admitting: Internal Medicine

## 2019-02-18 DIAGNOSIS — Z1231 Encounter for screening mammogram for malignant neoplasm of breast: Secondary | ICD-10-CM

## 2019-03-11 ENCOUNTER — Other Ambulatory Visit: Payer: Self-pay | Admitting: Internal Medicine

## 2019-03-26 ENCOUNTER — Other Ambulatory Visit: Payer: Self-pay

## 2019-03-26 ENCOUNTER — Ambulatory Visit (INDEPENDENT_AMBULATORY_CARE_PROVIDER_SITE_OTHER): Payer: Medicare Other

## 2019-03-26 DIAGNOSIS — Z23 Encounter for immunization: Secondary | ICD-10-CM

## 2019-04-25 ENCOUNTER — Other Ambulatory Visit: Payer: Self-pay | Admitting: Internal Medicine

## 2019-04-26 ENCOUNTER — Other Ambulatory Visit: Payer: Self-pay | Admitting: Internal Medicine

## 2019-04-27 NOTE — Telephone Encounter (Signed)
[place] Controlled Database Checked Last filled: 12/21/18 # 30 LOV w/you: 12/21/18 Next appt w/you: 07/05/19

## 2019-05-24 ENCOUNTER — Other Ambulatory Visit: Payer: Self-pay | Admitting: Internal Medicine

## 2019-06-18 ENCOUNTER — Other Ambulatory Visit: Payer: Self-pay | Admitting: Internal Medicine

## 2019-06-20 ENCOUNTER — Other Ambulatory Visit: Payer: Self-pay | Admitting: Internal Medicine

## 2019-07-04 NOTE — Patient Instructions (Addendum)
  Tests ordered today. Your results will be released to MyChart (or called to you) after review.  If any changes need to be made, you will be notified at that same time.   Medications reviewed and updated.  Changes include :   none  Your prescription(s) have been submitted to your pharmacy. Please take as directed and contact our office if you believe you are having problem(s) with the medication(s).    Please followup in 6 months   

## 2019-07-04 NOTE — Progress Notes (Signed)
Subjective:    Patient ID: [name], female    DOB: 01-18-1935, 84 y.o.   MRN: [id number]  HPI The patient is here for follow up.  She is not exercising regularly.    Hypertension: She is taking her medication daily. She is compliant with a low sodium diet.  She denies chest pain, palpitations and regular headaches.     Hyperlipidemia: She is taking her medication daily. She is compliant with a low fat/cholesterol diet. She denies myalgias.   Prediabetes:  She is compliant with a low sugar/carbohydrate diet.  She is not exercising regularly.  Anxiety, Depression: She is taking her medication daily as prescribed. She denies any side effects from the medication. She feels her depression and anxiety are well controlled and she is happy with her current dose of medication.   B/l knee OA, chronic pain:  She follows with ortho.  She takes advil prn.  She has had injections which helped for a short time.   Asthma:  She uses her albuterol a couple of times a week.  She will take it for chest tightness or shortness of breath.  She feels the inhaler does help.  She has not had any coughing, wheezing or fevers.     Medications and allergies reviewed with patient and updated if appropriate.  Patient Active Problem List   Diagnosis Date Noted  . Pain in heel 04/08/2018  . Pruritus 04/08/2018  . Chronic fatigue 08/07/2016  . DOE (dyspnea on exertion) 08/07/2016  . Chronic knee pain, bilateral 12/29/2015  . Prediabetes 10/13/2015  . Anxiety and depression 10/13/2015  . NONSPECIFIC ABNORMAL ELECTROCARDIOGRAM 08/25/2009  . Low back pain 11/20/2007  . Hyperlipidemia 06/23/2007  . Essential hypertension 06/23/2007  . Anemia 05/27/2007  . ALLERGIC RHINITIS 05/27/2007  . Asthma 05/27/2007  . CAROTID BRUIT, LEFT 05/27/2007  . METABOLIC SYNDROME X [id number]    Current Outpatient Medications on File Prior to Visit  Medication Sig Dispense Refill  . albuterol (VENTOLIN HFA) 108 (90  Base) MCG/ACT inhaler INHALE 2 PUFFS BY MOUTH EVERY 6 HOURS AS NEEDED FOR WHEEZING OR SHORTNESS OF BREATH 20.1 g 0  . Alum Hydroxide-Mag Carbonate (GAVISCON PO) Take 1 Dose by mouth as directed.     [name] amLODipine (NORVASC) 5 MG tablet TAKE 1 TABLET(5 MG) BY MOUTH DAILY 90 tablet 1  . aspirin 81 MG tablet Take 81 mg by mouth daily.      . Aspirin-Acetaminophen-Caffeine (EXCEDRIN PO) Take 1 Dose by mouth as directed.     . fluticasone (FLONASE) 50 MCG/ACT nasal spray SHAKE LIQUID AND USE 2 SPRAYS IN EACH NOSTRIL DAILY 16 g 3  . furosemide (LASIX) 40 MG tablet TAKE 1 TABLET BY MOUTH ONCE DAILY 90 tablet 1  . loratadine (CLARITIN) 10 MG tablet Take 1 tablet (10 mg total) by mouth daily. 30 tablet 11  . LORazepam (ATIVAN) 0.5 MG tablet TAKE 1 TABLET(0.5 MG) BY MOUTH DAILY AS NEEDED FOR ANXIETY 30 tablet 0  . losartan (COZAAR) 100 MG tablet TAKE 1 TABLET(100 MG) BY MOUTH DAILY 90 tablet 1  . pravastatin (PRAVACHOL) 20 MG tablet TAKE 1 TABLET(20 MG) BY MOUTH AT BEDTIME 90 tablet 1  . sertraline (ZOLOFT) 50 MG tablet TAKE 1 TABLET(50 MG) BY MOUTH DAILY 90 tablet 1   No current facility-administered medications on file prior to visit.    Past Medical History:  Diagnosis Date  . Allergy   . Anemia   . Asthma    adult onset  .  Carotid bruit    left  . Hyperlipidemia    LDL goal = < 120 based on NMR Lipoprofile 2005  . Hypertension   . Metabolic syndrome     Past Surgical History:  Procedure Laterality Date  . CARDIAC CATHETERIZATION  2006   negative  . CATARACT EXTRACTION, BILATERAL     Dr Bing Plume  . COLONOSCOPY  06/02/2006   Dr.Kaplan,Next Due date 06/02/2016  . SHOULDER SURGERY  11/21/2008   Dr.Aplington  . TOTAL ABDOMINAL HYSTERECTOMY W/ BILATERAL SALPINGOOPHORECTOMY  1983   Dr.Fore, for fibroids with pain  . TOTAL HIP ARTHROPLASTY  2005, 2006    Social History   Socioeconomic History  . Marital status: Widowed    Spouse name: Not on file  . Number of children: Not on file  .  Years of education: Not on file  . Highest education level: Not on file  Occupational History  . Not on file  Tobacco Use  . Smoking status: Never Smoker  . Smokeless tobacco: Never Used  Substance and Sexual Activity  . Alcohol use: No  . Drug use: No  . Sexual activity: Not on file  Other Topics Concern  . Not on file  Social History Narrative  . Not on file   Social Determinants of Health   Financial Resource Strain:   . Difficulty of Paying Living Expenses: Not on file  Food Insecurity:   . Worried About Charity fundraiser in the Last Year: Not on file  . Ran Out of Food in the Last Year: Not on file  Transportation Needs:   . Lack of Transportation (Medical): Not on file  . Lack of Transportation (Non-Medical): Not on file  Physical Activity:   . Days of Exercise per Week: Not on file  . Minutes of Exercise per Session: Not on file  Stress:   . Feeling of Stress : Not on file  Social Connections:   . Frequency of Communication with Friends and Family: Not on file  . Frequency of Social Gatherings with Friends and Family: Not on file  . Attends Religious Services: Not on file  . Active Member of Clubs or Organizations: Not on file  . Attends Archivist Meetings: Not on file  . Marital Status: Not on file    Family History  Problem Relation Age of Onset  . Hypertension Father   . Stroke Father        > 30  . Hypertension Mother   . Arthritis Mother   . Hypertension Sister   . Diabetes Paternal Grandmother   . Heart attack Maternal Grandfather        age unknown; ? > 54  . Stomach cancer Maternal Grandmother     Review of Systems  Constitutional: Positive for fatigue (low energy). Negative for chills and fever.  Respiratory: Positive for chest tightness (occ) and shortness of breath (occ). Negative for cough and wheezing.   Cardiovascular: Positive for leg swelling (controlled wth water pill). Negative for chest pain and palpitations.    Gastrointestinal:       No gerd  Neurological: Negative for light-headedness and headaches.       Objective:   Vitals:   07/05/19 0942  BP: 138/78  Pulse: (!) 58  Resp: 18  Temp: 98.1 F (36.7 C)  SpO2: 96%   BP Readings from Last 3 Encounters:  07/05/19 138/78  12/21/18 (!) 142/82  07/03/18 140/70   Wt Readings from Last 3 Encounters:  07/05/19  250 lb 12.8 oz (113.8 kg)  12/21/18 241 lb 12.8 oz (109.7 kg)  07/03/18 244 lb (110.7 kg)   Body mass index is 41.74 kg/m.   Physical Exam    Constitutional: Appears well-developed and well-nourished. No distress.  HENT:  Head: Normocephalic and atraumatic.  Neck: Neck supple. No tracheal deviation present. No thyromegaly present.  No cervical lymphadenopathy Cardiovascular: Normal rate, regular rhythm and normal heart sounds. No murmur heard. No carotid bruit .  No edema Pulmonary/Chest: Effort normal and breath sounds normal. No respiratory distress. No has no wheezes. No rales.  Skin: Skin is warm and dry. Not diaphoretic.  Psychiatric: Normal mood and affect. Behavior is normal.      Assessment & Plan:    See Problem List for Assessment and Plan of chronic medical problems.     This visit occurred during the SARS-CoV-2 public health emergency.  Safety protocols were in place, including screening questions prior to the visit, additional usage of staff PPE, and extensive cleaning of exam room while observing appropriate contact time as indicated for disinfecting solutions.

## 2019-07-05 ENCOUNTER — Ambulatory Visit (INDEPENDENT_AMBULATORY_CARE_PROVIDER_SITE_OTHER): Payer: Medicare Other | Admitting: Internal Medicine

## 2019-07-05 ENCOUNTER — Encounter: Payer: Self-pay | Admitting: Internal Medicine

## 2019-07-05 ENCOUNTER — Other Ambulatory Visit: Payer: Self-pay

## 2019-07-05 VITALS — BP 138/78 | HR 58 | Temp 98.1°F | Resp 18 | Ht 65.0 in | Wt 250.8 lb

## 2019-07-05 DIAGNOSIS — F419 Anxiety disorder, unspecified: Secondary | ICD-10-CM | POA: Diagnosis not present

## 2019-07-05 DIAGNOSIS — F329 Major depressive disorder, single episode, unspecified: Secondary | ICD-10-CM | POA: Diagnosis not present

## 2019-07-05 DIAGNOSIS — D649 Anemia, unspecified: Secondary | ICD-10-CM

## 2019-07-05 DIAGNOSIS — E7849 Other hyperlipidemia: Secondary | ICD-10-CM

## 2019-07-05 DIAGNOSIS — R7303 Prediabetes: Secondary | ICD-10-CM

## 2019-07-05 DIAGNOSIS — G8929 Other chronic pain: Secondary | ICD-10-CM

## 2019-07-05 DIAGNOSIS — I10 Essential (primary) hypertension: Secondary | ICD-10-CM

## 2019-07-05 DIAGNOSIS — J4521 Mild intermittent asthma with (acute) exacerbation: Secondary | ICD-10-CM

## 2019-07-05 DIAGNOSIS — F32A Depression, unspecified: Secondary | ICD-10-CM

## 2019-07-05 DIAGNOSIS — M25561 Pain in right knee: Secondary | ICD-10-CM

## 2019-07-05 DIAGNOSIS — M25562 Pain in left knee: Secondary | ICD-10-CM

## 2019-07-05 LAB — LIPID PANEL
Cholesterol: 145 mg/dL (ref 0–200)
HDL: 31.9 mg/dL — ABNORMAL LOW (ref >39.00)
LDL Cholesterol: 97 mg/dL (ref 0–99)
NonHDL: 113.09
Total CHOL/HDL Ratio: 5
Triglycerides: 82 mg/dL (ref 0.0–149.0)
VLDL: 16.4 mg/dL (ref 0.0–40.0)

## 2019-07-05 LAB — HEMOGLOBIN A1C: Hgb A1c MFr Bld: 6.2 % (ref 4.6–6.5)

## 2019-07-05 LAB — CBC WITH DIFFERENTIAL/PLATELET
Basophils Absolute: 0.1 10*3/uL (ref 0.0–0.1)
Basophils Relative: 0.8 % (ref 0.0–3.0)
Eosinophils Absolute: 0.1 10*3/uL (ref 0.0–0.7)
Eosinophils Relative: 1.6 % (ref 0.0–5.0)
HCT: 34 % — ABNORMAL LOW (ref 36.0–46.0)
Hemoglobin: 11 g/dL — ABNORMAL LOW (ref 12.0–15.0)
Lymphocytes Relative: 15.8 % (ref 12.0–46.0)
Lymphs Abs: 1.4 10*3/uL (ref 0.7–4.0)
MCHC: 32.5 g/dL (ref 30.0–36.0)
MCV: 95.1 fl (ref 78.0–100.0)
Monocytes Absolute: 0.6 10*3/uL (ref 0.1–1.0)
Monocytes Relative: 6.4 % (ref 3.0–12.0)
Neutro Abs: 6.6 10*3/uL (ref 1.4–7.7)
Neutrophils Relative %: 75.4 % (ref 43.0–77.0)
Platelets: 292 10*3/uL (ref 150.0–400.0)
RBC: 3.58 Mil/uL — ABNORMAL LOW (ref 3.87–5.11)
RDW: 12 % (ref 11.5–15.5)
WBC: 8.7 10*3/uL (ref 4.0–10.5)

## 2019-07-05 LAB — COMPREHENSIVE METABOLIC PANEL
ALT: 7 U/L (ref 0–35)
AST: 10 U/L (ref 0–37)
Albumin: 3.9 g/dL (ref 3.5–5.2)
Alkaline Phosphatase: 132 U/L — ABNORMAL HIGH (ref 39–117)
BUN: 19 mg/dL (ref 6–23)
CO2: 29 mEq/L (ref 19–32)
Calcium: 9.1 mg/dL (ref 8.4–10.5)
Chloride: 104 mEq/L (ref 96–112)
Creatinine, Ser: 1.01 mg/dL (ref 0.40–1.20)
GFR: 63.35 mL/min (ref >60.00)
Glucose, Bld: 149 mg/dL — ABNORMAL HIGH (ref 70–99)
Potassium: 3.5 mEq/L (ref 3.5–5.1)
Sodium: 140 mEq/L (ref 135–145)
Total Bilirubin: 0.9 mg/dL (ref 0.2–1.2)
Total Protein: 6.7 g/dL (ref 6.0–8.3)

## 2019-07-05 MED ORDER — LORAZEPAM 0.5 MG PO TABS: ORAL_TABLET | ORAL | 0 refills | Status: DC

## 2019-07-05 NOTE — Assessment & Plan Note (Signed)
Check lipid panel  Continue daily statin Regular exercise and healthy diet encouraged  

## 2019-07-05 NOTE — Assessment & Plan Note (Signed)
Check a1c Low sugar / carb diet Stressed regular exercise   

## 2019-07-05 NOTE — Assessment & Plan Note (Signed)
Following with orthopedics Taking Advil-she understands the risks and only takes it once a day Has intermittent injections, which helped temporarily

## 2019-07-05 NOTE — Assessment & Plan Note (Signed)
Mild, intermittent Has occasional chest tightness and shortness of breath and takes albuterol as needed, which is effective Takes albuterol about twice a week Continue

## 2019-07-05 NOTE — Assessment & Plan Note (Signed)
Chronic, mild anemia Normal iron, slightly elevated ferritin last year Likely anemia of chronic disease CBC

## 2019-07-05 NOTE — Assessment & Plan Note (Signed)
BP well controlled Current regimen effective and well tolerated Continue current medications at current doses cmp  

## 2019-07-05 NOTE — Assessment & Plan Note (Addendum)
Controlled, stable Continue current dose of medication - sertraline 50 mg daily Takes ativan nightly

## 2019-08-07 ENCOUNTER — Other Ambulatory Visit: Payer: Self-pay | Admitting: Internal Medicine

## 2019-08-29 ENCOUNTER — Other Ambulatory Visit: Payer: Self-pay | Admitting: Internal Medicine

## 2019-09-21 NOTE — Progress Notes (Signed)
Subjective:    Patient ID: [name], female    DOB: 1934-12-14, 85 y.o.   MRN: [id number]  HPI The patient is here for an acute visit.  Dizzy spells x 3 weeks, swimmy headed even lying down  She has lightheadedness, but does not feels like she is going to faint.  She denies true spinning.  She has had vertigo in the past.  She feels it just laying in bed and not moving.  She feels it when she gets up and gets in the bed.  She does state it seems to be more in the morning and at night and often when getting in and out of bed.  The symptoms have gotten less often and less intense.  Yesterday she did not feel this and today she has not felt it.  She had her second covid vaccine mid feb.      Medications and allergies reviewed with patient and updated if appropriate.  Patient Active Problem List   Diagnosis Date Noted  . Pain in heel 04/08/2018  . Pruritus 04/08/2018  . Chronic fatigue 08/07/2016  . DOE (dyspnea on exertion) 08/07/2016  . Chronic knee pain, bilateral 12/29/2015  . Prediabetes 10/13/2015  . Anxiety and depression 10/13/2015  . NONSPECIFIC ABNORMAL ELECTROCARDIOGRAM 08/25/2009  . Low back pain 11/20/2007  . Hyperlipidemia 06/23/2007  . Essential hypertension 06/23/2007  . Anemia 05/27/2007  . ALLERGIC RHINITIS 05/27/2007  . Asthma 05/27/2007  . CAROTID BRUIT, LEFT 05/27/2007  . METABOLIC SYNDROME X 123456    Current Outpatient Medications on File Prior to Visit  Medication Sig Dispense Refill  . albuterol (VENTOLIN HFA) 108 (90 Base) MCG/ACT inhaler INHALE 2 PUFFS BY MOUTH EVERY 6 HOURS AS NEEDED FOR WHEEZING OR SHORTNESS OF BREATH 20.1 g 0  . Alum Hydroxide-Mag Carbonate (GAVISCON PO) Take 1 Dose by mouth as directed.     [name] amLODipine (NORVASC) 5 MG tablet TAKE 1 TABLET(5 MG) BY MOUTH DAILY 90 tablet 1  . aspirin 81 MG tablet Take 81 mg by mouth daily.      . Aspirin-Acetaminophen-Caffeine (EXCEDRIN PO) Take 1 Dose by mouth as directed.     .  fluticasone (FLONASE) 50 MCG/ACT nasal spray SHAKE LIQUID AND USE 2 SPRAYS IN EACH NOSTRIL DAILY 16 g 2  . furosemide (LASIX) 40 MG tablet TAKE 1 TABLET BY MOUTH ONCE DAILY 90 tablet 1  . loratadine (CLARITIN) 10 MG tablet Take 1 tablet (10 mg total) by mouth daily. 30 tablet 11  . LORazepam (ATIVAN) 0.5 MG tablet TAKE 1 TABLET(0.5 MG) BY MOUTH DAILY AS NEEDED FOR ANXIETY 30 tablet 0  . losartan (COZAAR) 100 MG tablet TAKE 1 TABLET(100 MG) BY MOUTH DAILY 90 tablet 1  . pravastatin (PRAVACHOL) 20 MG tablet TAKE 1 TABLET(20 MG) BY MOUTH AT BEDTIME 90 tablet 1  . sertraline (ZOLOFT) 50 MG tablet TAKE 1 TABLET(50 MG) BY MOUTH DAILY 90 tablet 1   No current facility-administered medications on file prior to visit.    Past Medical History:  Diagnosis Date  . Allergy   . Anemia   . Asthma    adult onset  . Carotid bruit    left  . Hyperlipidemia    LDL goal = < 120 based on NMR Lipoprofile 2005  . Hypertension   . Metabolic syndrome     Past Surgical History:  Procedure Laterality Date  . CARDIAC CATHETERIZATION  2006   negative  . CATARACT EXTRACTION, BILATERAL  Dr Bing Plume  . COLONOSCOPY  06/02/2006   Dr.Kaplan,Next Due date 06/02/2016  . SHOULDER SURGERY  11/21/2008   Dr.Aplington  . TOTAL ABDOMINAL HYSTERECTOMY W/ BILATERAL SALPINGOOPHORECTOMY  1983   Dr.Fore, for fibroids with pain  . TOTAL HIP ARTHROPLASTY  2005, 2006    Social History   Socioeconomic History  . Marital status: Widowed    Spouse name: Not on file  . Number of children: Not on file  . Years of education: Not on file  . Highest education level: Not on file  Occupational History  . Not on file  Tobacco Use  . Smoking status: Never Smoker  . Smokeless tobacco: Never Used  Substance and Sexual Activity  . Alcohol use: No  . Drug use: No  . Sexual activity: Not on file  Other Topics Concern  . Not on file  Social History Narrative  . Not on file   Social Determinants of Health   Financial  Resource Strain:   . Difficulty of Paying Living Expenses: Not on file  Food Insecurity:   . Worried About Charity fundraiser in the Last Year: Not on file  . Ran Out of Food in the Last Year: Not on file  Transportation Needs:   . Lack of Transportation (Medical): Not on file  . Lack of Transportation (Non-Medical): Not on file  Physical Activity:   . Days of Exercise per Week: Not on file  . Minutes of Exercise per Session: Not on file  Stress:   . Feeling of Stress : Not on file  Social Connections:   . Frequency of Communication with Friends and Family: Not on file  . Frequency of Social Gatherings with Friends and Family: Not on file  . Attends Religious Services: Not on file  . Active Member of Clubs or Organizations: Not on file  . Attends Archivist Meetings: Not on file  . Marital Status: Not on file    Family History  Problem Relation Age of Onset  . Hypertension Father   . Stroke Father        > 38  . Hypertension Mother   . Arthritis Mother   . Hypertension Sister   . Diabetes Paternal Grandmother   . Heart attack Maternal Grandfather        age unknown; ? > 77  . Stomach cancer Maternal Grandmother     Review of Systems  Constitutional: Negative for fever.  HENT: Negative for congestion, ear pain, sinus pain and sore throat.   Eyes: Negative for visual disturbance.  Respiratory: Positive for shortness of breath (DOE  chronic). Negative for cough and wheezing.   Cardiovascular: Negative for chest pain and palpitations.  Gastrointestinal: Positive for nausea (1-2 times in the past three weeks).  Neurological: Positive for dizziness, light-headedness, numbness (occ fingers go numb - not new) and headaches (occ - no change). Negative for weakness.       Objective:   Vitals:   09/22/19 1018  BP: 140/68  Pulse: (!) 59  Temp: 98.1 F (36.7 C)  SpO2: 96%   BP Readings from Last 3 Encounters:  09/22/19 140/68  07/05/19 138/78  12/21/18 (!)  142/82   Wt Readings from Last 3 Encounters:  09/22/19 244 lb (110.7 kg)  07/05/19 250 lb 12.8 oz (113.8 kg)  12/21/18 241 lb 12.8 oz (109.7 kg)   Body mass index is 40.6 kg/m.   Physical Exam Constitutional:      General: She is not in  acute distress.    Appearance: Normal appearance. She is not ill-appearing.  HENT:     Head: Normocephalic and atraumatic.     Right Ear: Tympanic membrane, ear canal and external ear normal. There is no impacted cerumen.     Left Ear: Tympanic membrane, ear canal and external ear normal. There is no impacted cerumen.     Mouth/Throat:     Mouth: Mucous membranes are moist.     Pharynx: No oropharyngeal exudate or posterior oropharyngeal erythema.  Eyes:     General: No scleral icterus.    Extraocular Movements: Extraocular movements intact.     Comments: No nystagmus  Neck:     Vascular: No carotid bruit.  Cardiovascular:     Rate and Rhythm: Normal rate and regular rhythm.     Heart sounds: No murmur.  Pulmonary:     Effort: Pulmonary effort is normal. No respiratory distress.     Breath sounds: Normal breath sounds. No wheezing or rales.  Musculoskeletal:     Cervical back: Neck supple. No tenderness.     Right lower leg: No edema.     Left lower leg: No edema.  Lymphadenopathy:     Cervical: No cervical adenopathy.  Skin:    General: Skin is warm and dry.  Neurological:     General: No focal deficit present.     Mental Status: She is alert.  Psychiatric:        Mood and Affect: Mood normal.        Behavior: Behavior normal.            Assessment & Plan:    See Problem List for Assessment and Plan of chronic medical problems.    This visit occurred during the SARS-CoV-2 public health emergency.  Safety protocols were in place, including screening questions prior to the visit, additional usage of staff PPE, and extensive cleaning of exam room while observing appropriate contact time as indicated for disinfecting  solutions.

## 2019-09-22 ENCOUNTER — Other Ambulatory Visit: Payer: Self-pay

## 2019-09-22 ENCOUNTER — Ambulatory Visit (INDEPENDENT_AMBULATORY_CARE_PROVIDER_SITE_OTHER): Payer: Medicare PPO | Admitting: Internal Medicine

## 2019-09-22 ENCOUNTER — Encounter: Payer: Self-pay | Admitting: Internal Medicine

## 2019-09-22 DIAGNOSIS — R42 Dizziness and giddiness: Secondary | ICD-10-CM | POA: Insufficient documentation

## 2019-09-22 MED ORDER — LORAZEPAM 0.5 MG PO TABS: ORAL_TABLET | ORAL | 0 refills | Status: DC

## 2019-09-22 NOTE — Assessment & Plan Note (Signed)
Acute Intermittent lightheadedness over the past 3 weeks, but none yesterday or today Sounds like a vertigo variant-seems to be more related to changes in position, but she is not 100% sure No concerning symptoms suggestive of stroke No URI symptoms ?  Related to recent Covid vaccine At this point she has not had any symptoms yesterday or today and over the past 3 weeks her symptoms have gotten less intense and less often so I do think this is fixing itself We will hold off on any further evaluation or treatment at this time She will let me know if her symptoms recur

## 2019-09-22 NOTE — Patient Instructions (Signed)
Since your lightheadedness has improved and you have not had in the past couple of days lets monitor for now.  If it recurs let me know so we can evaluate further.

## 2019-10-04 DIAGNOSIS — H26493 Other secondary cataract, bilateral: Secondary | ICD-10-CM | POA: Diagnosis not present

## 2019-10-04 DIAGNOSIS — H35033 Hypertensive retinopathy, bilateral: Secondary | ICD-10-CM | POA: Diagnosis not present

## 2019-10-04 DIAGNOSIS — H04123 Dry eye syndrome of bilateral lacrimal glands: Secondary | ICD-10-CM | POA: Diagnosis not present

## 2019-10-04 DIAGNOSIS — D3132 Benign neoplasm of left choroid: Secondary | ICD-10-CM | POA: Diagnosis not present

## 2019-10-04 DIAGNOSIS — H524 Presbyopia: Secondary | ICD-10-CM | POA: Diagnosis not present

## 2019-11-25 ENCOUNTER — Other Ambulatory Visit: Payer: Self-pay | Admitting: Internal Medicine

## 2019-11-26 ENCOUNTER — Other Ambulatory Visit: Payer: Self-pay | Admitting: Internal Medicine

## 2019-12-13 ENCOUNTER — Other Ambulatory Visit: Payer: Self-pay | Admitting: Internal Medicine

## 2019-12-14 ENCOUNTER — Other Ambulatory Visit: Payer: Self-pay | Admitting: Internal Medicine

## 2020-01-08 ENCOUNTER — Other Ambulatory Visit: Payer: Self-pay | Admitting: Internal Medicine

## 2020-01-10 ENCOUNTER — Other Ambulatory Visit: Payer: Self-pay | Admitting: Internal Medicine

## 2020-01-11 ENCOUNTER — Other Ambulatory Visit: Payer: Self-pay | Admitting: Internal Medicine

## 2020-01-11 DIAGNOSIS — Z1231 Encounter for screening mammogram for malignant neoplasm of breast: Secondary | ICD-10-CM

## 2020-01-11 NOTE — Patient Instructions (Addendum)
  Blood work was ordered.     Medications reviewed and updated.  Changes include :   none  Your prescription(s) have been submitted to your pharmacy. Please take as directed and contact our office if you believe you are having problem(s) with the medication(s).   Please followup in 6 months   

## 2020-01-11 NOTE — Progress Notes (Signed)
Subjective:    Patient ID: [name], female    DOB: 14-Oct-1934, 85 y.o.   MRN: [id number]  HPI The patient is here for follow up of their chronic medical problems, including htn, hyperlipidemia, prediabetes, anxiety, depression, b/l knee OA, chronic pain, asthma  She is not exercising regularly.     She is tired all the time and does not want to move.  She has arthritis pain.  Her hip pain limits her standing and walking.    She takes B12.    She has occasional anxiety.  She feels her depression is controlled.    Medications and allergies reviewed with patient and updated if appropriate.  Patient Active Problem List   Diagnosis Date Noted  . Lightheadedness 09/22/2019  . Pain in heel 04/08/2018  . Pruritus 04/08/2018  . Chronic fatigue 08/07/2016  . DOE (dyspnea on exertion) 08/07/2016  . Chronic knee pain, bilateral 12/29/2015  . Prediabetes 10/13/2015  . Anxiety and depression 10/13/2015  . NONSPECIFIC ABNORMAL ELECTROCARDIOGRAM 08/25/2009  . Low back pain 11/20/2007  . Hyperlipidemia 06/23/2007  . Essential hypertension 06/23/2007  . Anemia 05/27/2007  . ALLERGIC RHINITIS 05/27/2007  . Asthma 05/27/2007  . CAROTID BRUIT, LEFT 05/27/2007  . METABOLIC SYNDROME X 62/95/2841    Current Outpatient Medications on File Prior to Visit  Medication Sig Dispense Refill  . albuterol (VENTOLIN HFA) 108 (90 Base) MCG/ACT inhaler INHALE 2 PUFFS BY MOUTH EVERY 6 HOURS AS NEEDED FOR WHEEZING OR SHORTNESS OF BREATH 20.1 g 0  . Alum Hydroxide-Mag Carbonate (GAVISCON PO) Take 1 Dose by mouth as directed.     [name] amLODipine (NORVASC) 5 MG tablet TAKE 1 TABLET(5 MG) BY MOUTH DAILY 30 tablet 0  . aspirin 81 MG tablet Take 81 mg by mouth daily.      . Aspirin-Acetaminophen-Caffeine (EXCEDRIN PO) Take 1 Dose by mouth as directed.     . fluticasone (FLONASE) 50 MCG/ACT nasal spray SHAKE LIQUID AND USE 2 SPRAYS IN EACH NOSTRIL DAILY 48 g 1  . furosemide (LASIX) 40 MG tablet TAKE 1  TABLET(40 MG) BY MOUTH DAILY 30 tablet 0  . loratadine (CLARITIN) 10 MG tablet Take 1 tablet (10 mg total) by mouth daily. 30 tablet 11  . LORazepam (ATIVAN) 0.5 MG tablet TAKE 1 TABLET(0.5 MG) BY MOUTH DAILY AS NEEDED FOR ANXIETY 30 tablet 0  . losartan (COZAAR) 100 MG tablet Take 0.5 tablets (50 mg total) by mouth daily. Need office visit for more refills. 30 tablet 0  . pravastatin (PRAVACHOL) 20 MG tablet TAKE 1 TABLET(20 MG) BY MOUTH DAILY 30 tablet 0  . sertraline (ZOLOFT) 50 MG tablet TAKE 1 TABLET(50 MG) BY MOUTH DAILY 90 tablet 1   No current facility-administered medications on file prior to visit.    Past Medical History:  Diagnosis Date  . Allergy   . Anemia   . Asthma    adult onset  . Carotid bruit    left  . Hyperlipidemia    LDL goal = < 120 based on NMR Lipoprofile 2005  . Hypertension   . Metabolic syndrome     Past Surgical History:  Procedure Laterality Date  . CARDIAC CATHETERIZATION  2006   negative  . CATARACT EXTRACTION, BILATERAL     Dr Bing Plume  . COLONOSCOPY  06/02/2006   Dr.Kaplan,Next Due date 06/02/2016  . SHOULDER SURGERY  11/21/2008   Dr.Aplington  . TOTAL ABDOMINAL HYSTERECTOMY W/ BILATERAL SALPINGOOPHORECTOMY  1983   Dr.Fore, for fibroids with  pain  . TOTAL HIP ARTHROPLASTY  2005, 2006    Social History   Socioeconomic History  . Marital status: Widowed    Spouse name: Not on file  . Number of children: Not on file  . Years of education: Not on file  . Highest education level: Not on file  Occupational History  . Not on file  Tobacco Use  . Smoking status: Never Smoker  . Smokeless tobacco: Never Used  Substance and Sexual Activity  . Alcohol use: No  . Drug use: No  . Sexual activity: Not on file  Other Topics Concern  . Not on file  Social History Narrative  . Not on file   Social Determinants of Health   Financial Resource Strain:   . Difficulty of Paying Living Expenses:   Food Insecurity:   . Worried About Paediatric nurse in the Last Year:   . Arboriculturist in the Last Year:   Transportation Needs:   . Film/video editor (Medical):   [name] Lack of Transportation (Non-Medical):   Physical Activity:   . Days of Exercise per Week:   . Minutes of Exercise per Session:   Stress:   . Feeling of Stress :   Social Connections:   . Frequency of Communication with Friends and Family:   . Frequency of Social Gatherings with Friends and Family:   . Attends Religious Services:   . Active Member of Clubs or Organizations:   . Attends Archivist Meetings:   [name] Marital Status:     Family History  Problem Relation Age of Onset  . Hypertension Father   . Stroke Father        > 14  . Hypertension Mother   . Arthritis Mother   . Hypertension Sister   . Diabetes Paternal Grandmother   . Heart attack Maternal Grandfather        age unknown; ? > 79  . Stomach cancer Maternal Grandmother     Review of Systems  Constitutional: Positive for fatigue. Negative for chills and fever.  Respiratory: Positive for cough and shortness of breath (chronic with exertion). Negative for wheezing.   Cardiovascular: Positive for leg swelling (controlled with lasix). Negative for chest pain and palpitations.  Neurological: Negative for dizziness, light-headedness and headaches.       Objective:   Vitals:   01/12/20 0909  BP: 130/64  Pulse: 93  Temp: 98.2 F (36.8 C)  SpO2: 96%   BP Readings from Last 3 Encounters:  01/12/20 130/64  09/22/19 140/68  07/05/19 138/78   Wt Readings from Last 3 Encounters:  01/12/20 244 lb (110.7 kg)  09/22/19 244 lb (110.7 kg)  07/05/19 250 lb 12.8 oz (113.8 kg)   Body mass index is 40.6 kg/m.   Physical Exam    Constitutional: Appears well-developed and well-nourished. No distress.  HENT:  Head: Normocephalic and atraumatic.  Neck: Neck supple. No tracheal deviation present. No thyromegaly present.  No cervical lymphadenopathy Cardiovascular: Normal  rate, regular rhythm and normal heart sounds.   No murmur heard. No carotid bruit .  No edema Pulmonary/Chest: Effort normal and breath sounds normal. No respiratory distress. No has no wheezes. No rales.  Skin: Skin is warm and dry. Not diaphoretic.  Psychiatric: Normal mood and affect. Behavior is normal.      Assessment & Plan:    See Problem List for Assessment and Plan of chronic medical problems.    This visit occurred  during the SARS-CoV-2 public health emergency.  Safety protocols were in place, including screening questions prior to the visit, additional usage of staff PPE, and extensive cleaning of exam room while observing appropriate contact time as indicated for disinfecting solutions.

## 2020-01-12 ENCOUNTER — Ambulatory Visit (INDEPENDENT_AMBULATORY_CARE_PROVIDER_SITE_OTHER): Payer: Medicare PPO | Admitting: Internal Medicine

## 2020-01-12 ENCOUNTER — Encounter: Payer: Self-pay | Admitting: Internal Medicine

## 2020-01-12 ENCOUNTER — Other Ambulatory Visit: Payer: Self-pay

## 2020-01-12 VITALS — BP 130/64 | HR 93 | Temp 98.2°F | Ht 65.0 in | Wt 244.0 lb

## 2020-01-12 DIAGNOSIS — M25561 Pain in right knee: Secondary | ICD-10-CM

## 2020-01-12 DIAGNOSIS — F329 Major depressive disorder, single episode, unspecified: Secondary | ICD-10-CM

## 2020-01-12 DIAGNOSIS — F32A Depression, unspecified: Secondary | ICD-10-CM

## 2020-01-12 DIAGNOSIS — R7303 Prediabetes: Secondary | ICD-10-CM | POA: Diagnosis not present

## 2020-01-12 DIAGNOSIS — M25562 Pain in left knee: Secondary | ICD-10-CM

## 2020-01-12 DIAGNOSIS — F419 Anxiety disorder, unspecified: Secondary | ICD-10-CM

## 2020-01-12 DIAGNOSIS — D649 Anemia, unspecified: Secondary | ICD-10-CM

## 2020-01-12 DIAGNOSIS — I10 Essential (primary) hypertension: Secondary | ICD-10-CM

## 2020-01-12 DIAGNOSIS — E7849 Other hyperlipidemia: Secondary | ICD-10-CM | POA: Diagnosis not present

## 2020-01-12 DIAGNOSIS — J4521 Mild intermittent asthma with (acute) exacerbation: Secondary | ICD-10-CM | POA: Diagnosis not present

## 2020-01-12 DIAGNOSIS — R5382 Chronic fatigue, unspecified: Secondary | ICD-10-CM

## 2020-01-12 DIAGNOSIS — G8929 Other chronic pain: Secondary | ICD-10-CM

## 2020-01-12 LAB — COMPREHENSIVE METABOLIC PANEL
ALT: 8 U/L (ref 0–35)
AST: 10 U/L (ref 0–37)
Albumin: 4.1 g/dL (ref 3.5–5.2)
Alkaline Phosphatase: 124 U/L — ABNORMAL HIGH (ref 39–117)
BUN: 20 mg/dL (ref 6–23)
CO2: 27 mEq/L (ref 19–32)
Calcium: 9.5 mg/dL (ref 8.4–10.5)
Chloride: 105 mEq/L (ref 96–112)
Creatinine, Ser: 1.02 mg/dL (ref 0.40–1.20)
GFR: 62.55 mL/min (ref >60.00)
Glucose, Bld: 161 mg/dL — ABNORMAL HIGH (ref 70–99)
Potassium: 3.6 mEq/L (ref 3.5–5.1)
Sodium: 138 mEq/L (ref 135–145)
Total Bilirubin: 0.8 mg/dL (ref 0.2–1.2)
Total Protein: 7.4 g/dL (ref 6.0–8.3)

## 2020-01-12 LAB — CBC WITH DIFFERENTIAL/PLATELET
Basophils Absolute: 0.1 10*3/uL (ref 0.0–0.1)
Basophils Relative: 1 % (ref 0.0–3.0)
Eosinophils Absolute: 0.2 10*3/uL (ref 0.0–0.7)
Eosinophils Relative: 2.8 % (ref 0.0–5.0)
HCT: 34.3 % — ABNORMAL LOW (ref 36.0–46.0)
Hemoglobin: 11.4 g/dL — ABNORMAL LOW (ref 12.0–15.0)
Lymphocytes Relative: 15.6 % (ref 12.0–46.0)
Lymphs Abs: 1.2 10*3/uL (ref 0.7–4.0)
MCHC: 33.3 g/dL (ref 30.0–36.0)
MCV: 95.5 fl (ref 78.0–100.0)
Monocytes Absolute: 0.5 10*3/uL (ref 0.1–1.0)
Monocytes Relative: 6.4 % (ref 3.0–12.0)
Neutro Abs: 5.9 10*3/uL (ref 1.4–7.7)
Neutrophils Relative %: 74.2 % (ref 43.0–77.0)
Platelets: 285 10*3/uL (ref 150.0–400.0)
RBC: 3.59 Mil/uL — ABNORMAL LOW (ref 3.87–5.11)
RDW: 11.9 % (ref 11.5–15.5)
WBC: 8 10*3/uL (ref 4.0–10.5)

## 2020-01-12 LAB — TSH: TSH: 2.22 u[IU]/mL (ref 0.35–4.50)

## 2020-01-12 LAB — LIPID PANEL
Cholesterol: 145 mg/dL (ref 0–200)
HDL: 32.4 mg/dL — ABNORMAL LOW (ref >39.00)
LDL Cholesterol: 94 mg/dL (ref 0–99)
NonHDL: 112.21
Total CHOL/HDL Ratio: 4
Triglycerides: 89 mg/dL (ref 0.0–149.0)
VLDL: 17.8 mg/dL (ref 0.0–40.0)

## 2020-01-12 LAB — HEMOGLOBIN A1C: Hgb A1c MFr Bld: 6.4 % (ref 4.6–6.5)

## 2020-01-12 MED ORDER — AMLODIPINE BESYLATE 5 MG PO TABS: ORAL_TABLET | ORAL | 1 refills | Status: DC

## 2020-01-12 MED ORDER — LORAZEPAM 0.5 MG PO TABS: ORAL_TABLET | ORAL | 2 refills | Status: DC

## 2020-01-12 MED ORDER — PRAVASTATIN SODIUM 20 MG PO TABS: ORAL_TABLET | ORAL | 1 refills | Status: DC

## 2020-01-12 MED ORDER — FUROSEMIDE 40 MG PO TABS: ORAL_TABLET | ORAL | 1 refills | Status: DC

## 2020-01-12 MED ORDER — LOSARTAN POTASSIUM 100 MG PO TABS: 50.0000 mg | ORAL_TABLET | Freq: Every day | ORAL | 1 refills | Status: DC

## 2020-01-12 NOTE — Assessment & Plan Note (Signed)
History of anemia Check CBC, iron panel She is taking vitamin B12

## 2020-01-12 NOTE — Assessment & Plan Note (Addendum)
Chronic Check a1c Low sugar / carb diet Stressed regular exercise Encouraged weight loss 

## 2020-01-12 NOTE — Assessment & Plan Note (Signed)
Chronic Mild, intermittent Uses albuterol on occasion-not often Continue as needed albuterol

## 2020-01-12 NOTE — Assessment & Plan Note (Signed)
Chronic Related to osteoarthritis Following with orthopedics Steroid injections have given minimal relief-we will try gel injections-encouraged her to follow-up with orthopedics Encouraged weight loss

## 2020-01-12 NOTE — Assessment & Plan Note (Signed)
Chronic Check lipid panel  Continue daily statin Regular exercise and healthy diet encouraged  

## 2020-01-12 NOTE — Assessment & Plan Note (Signed)
Chronic Overall she feels her depression and anxiety are controlled Continue sertraline at current dose and Ativan

## 2020-01-12 NOTE — Assessment & Plan Note (Signed)
Chronic BP well controlled Current regimen effective and well tolerated Continue current medications at current doses cmp  

## 2020-01-12 NOTE — Assessment & Plan Note (Addendum)
Chronic Anxiety and depression likely not contributing Her arthritis pain may be contributing as well as her obesity and sedentary lifestyle She does feel her sleep is refreshing We will check basic labs including CBC, TSH, but her fatigue is chronic and unlikely to be explained with blood work Encouraged her to be as active as possible and to work on weight loss

## 2020-01-13 LAB — IRON,TIBC AND FERRITIN PANEL
%SAT: 21 % (calc) (ref 16–45)
Ferritin: 375 ng/mL — ABNORMAL HIGH (ref 16–288)
Iron: 57 ug/dL (ref 45–160)
TIBC: 267 mcg/dL (calc) (ref 250–450)

## 2020-01-25 ENCOUNTER — Telehealth: Payer: Self-pay

## 2020-01-25 NOTE — Telephone Encounter (Signed)
New message    Please mail test results to [address] [address] [place]  [place] [address]

## 2020-01-26 NOTE — Telephone Encounter (Signed)
Results already mailed out to patient to address given back on 01/21/20.

## 2020-01-28 DIAGNOSIS — M1711 Unilateral primary osteoarthritis, right knee: Secondary | ICD-10-CM | POA: Diagnosis not present

## 2020-01-28 DIAGNOSIS — M1712 Unilateral primary osteoarthritis, left knee: Secondary | ICD-10-CM | POA: Diagnosis not present

## 2020-01-28 DIAGNOSIS — M170 Bilateral primary osteoarthritis of knee: Secondary | ICD-10-CM | POA: Diagnosis not present

## 2020-02-03 ENCOUNTER — Telehealth: Payer: Self-pay | Admitting: Internal Medicine

## 2020-02-03 NOTE — Telephone Encounter (Signed)
New message:   Pt is calling and states she still has not received a copy of her lab results. She ask that we please print them out again and she will come and pick them up. Pt is asking for a call to her know when they are ready. Please advise.

## 2020-02-03 NOTE — Telephone Encounter (Signed)
Notified pt will leave copy of labs for p/u.Marland KitchenJohny [name]

## 2020-02-15 ENCOUNTER — Other Ambulatory Visit: Payer: Self-pay | Admitting: Internal Medicine

## 2020-02-15 ENCOUNTER — Telehealth: Payer: Self-pay

## 2020-02-15 NOTE — Telephone Encounter (Signed)
New message   Pt c/o medication issue:  1. Name of Medication: losartan (COZAAR) 100 MG tablet  2. How are you currently taking this medication (dosage and times per day)?  Once a day   3. Are you having a reaction (difficulty breathing--STAT)? No   4. What is your medication issue? Clarification on dosage mg

## 2020-02-16 NOTE — Telephone Encounter (Signed)
New message:   Pt is calling back to see what the dosage of this medication (losartan). Please call pt on [phone].

## 2020-02-16 NOTE — Telephone Encounter (Signed)
Tried to reach patient but VM full and unable to leave message. She should be taking 1/2 pill of the losartan daily.

## 2020-02-21 ENCOUNTER — Other Ambulatory Visit: Payer: Self-pay

## 2020-02-21 ENCOUNTER — Ambulatory Visit
Admission: RE | Admit: 2020-02-21 | Discharge: 2020-02-21 | Disposition: A | Discharge status: Home / Self Care | Payer: Medicare PPO | Source: Ambulatory Visit | Attending: Internal Medicine | Admitting: Internal Medicine

## 2020-02-21 DIAGNOSIS — Z1231 Encounter for screening mammogram for malignant neoplasm of breast: Secondary | ICD-10-CM | POA: Diagnosis not present

## 2020-02-24 DIAGNOSIS — M170 Bilateral primary osteoarthritis of knee: Secondary | ICD-10-CM | POA: Diagnosis not present

## 2020-03-02 DIAGNOSIS — M170 Bilateral primary osteoarthritis of knee: Secondary | ICD-10-CM | POA: Diagnosis not present

## 2020-03-02 DIAGNOSIS — R269 Unspecified abnormalities of gait and mobility: Secondary | ICD-10-CM | POA: Diagnosis not present

## 2020-03-10 DIAGNOSIS — M170 Bilateral primary osteoarthritis of knee: Secondary | ICD-10-CM | POA: Diagnosis not present

## 2020-03-10 DIAGNOSIS — M1712 Unilateral primary osteoarthritis, left knee: Secondary | ICD-10-CM | POA: Diagnosis not present

## 2020-03-10 DIAGNOSIS — M1711 Unilateral primary osteoarthritis, right knee: Secondary | ICD-10-CM | POA: Diagnosis not present

## 2020-04-03 ENCOUNTER — Ambulatory Visit (INDEPENDENT_AMBULATORY_CARE_PROVIDER_SITE_OTHER): Payer: Medicare PPO

## 2020-04-03 ENCOUNTER — Other Ambulatory Visit: Payer: Self-pay

## 2020-04-03 DIAGNOSIS — Z23 Encounter for immunization: Secondary | ICD-10-CM

## 2020-04-03 NOTE — Progress Notes (Signed)
High dose flu vaccine given w/o complications.

## 2020-04-08 ENCOUNTER — Telehealth (INDEPENDENT_AMBULATORY_CARE_PROVIDER_SITE_OTHER): Payer: Medicare PPO | Admitting: Family Medicine

## 2020-04-08 ENCOUNTER — Encounter: Payer: Self-pay | Admitting: Family Medicine

## 2020-04-08 ENCOUNTER — Other Ambulatory Visit: Payer: Self-pay

## 2020-04-08 DIAGNOSIS — H9202 Otalgia, left ear: Secondary | ICD-10-CM | POA: Diagnosis not present

## 2020-04-08 MED ORDER — AMOXICILLIN-POT CLAVULANATE 875-125 MG PO TABS: 1.0000 | ORAL_TABLET | Freq: Two times a day (BID) | ORAL | 0 refills | Status: DC

## 2020-04-08 NOTE — Assessment & Plan Note (Signed)
Suspect this is related to and otitis media given her symptoms and her history of similar symptoms with otitis media.  Discussed empirically treating with Augmentin for 10 days.  I also discussed having her get tested for COVID-19 to be on the safe side given her sore throat.  Discussed she could schedule an appointment at one of the local [facility] or [facility]ies for testing.  Advised to stay quarantined at home at least until she gets the test result back.  Discussed she should be improving with her ear within 2 to 3 days and if she is not improving by early next week she needs to contact her PCPs office.  Advised that she needs to complete the entire course of the Augmentin even if her symptoms resolve prior to 10 days.

## 2020-04-08 NOTE — Progress Notes (Signed)
Virtual Visit via telephone Note  This visit type was conducted due to national recommendations for restrictions regarding the COVID-19 pandemic (e.g. social distancing).  This format is felt to be most appropriate for this patient at this time.  All issues noted in this document were discussed and addressed.  No physical exam was performed (except for noted visual exam findings with Video Visits).   I connected with [name] today at 10:40 AM EDT by telephone and verified that I am speaking with the correct person using two identifiers. Location patient: home Location provider: work Persons participating in the virtual visit: patient, provider  I discussed the limitations, risks, security and privacy concerns of performing an evaluation and management service by telephone and the availability of in person appointments. I also discussed with the patient that there may be a patient responsible charge related to this service. The patient expressed understanding and agreed to proceed.  Interactive audio and video telecommunications were attempted between this provider and patient, however failed, due to patient having technical difficulties OR patient did not have access to video capability.  We continued and completed visit with audio only.   Reason for visit: same day visit  HPI: Left earache/sore throat: Patient notes the ear pain started 2 days ago.  Has no hearing loss.  No tinnitus.  No drainage from the ear.  Notes she also has a sore throat on the left side.  No cough or congestion.  No fevers.  No postnasal drip.  No shortness of breath.  No taste or smell disturbances.  She is fully vaccinated against COVID-19.  No COVID-19 exposures.  She does have a history of ear infections in the past that felt similar to this and were treated with antibiotics.   ROS: See pertinent positives and negatives per HPI.  Past Medical History:  Diagnosis Date  . Allergy   . Anemia   . Asthma     adult onset  . Carotid bruit    left  . Hyperlipidemia    LDL goal = < 120 based on NMR Lipoprofile 2005  . Hypertension   . Metabolic syndrome     Past Surgical History:  Procedure Laterality Date  . CARDIAC CATHETERIZATION  2006   negative  . CATARACT EXTRACTION, BILATERAL     Dr Bing Plume  . COLONOSCOPY  06/02/2006   Dr.Kaplan,Next Due date 06/02/2016  . SHOULDER SURGERY  11/21/2008   Dr.Aplington  . TOTAL ABDOMINAL HYSTERECTOMY W/ BILATERAL SALPINGOOPHORECTOMY  1983   Dr.Fore, for fibroids with pain  . TOTAL HIP ARTHROPLASTY  2005, 2006    Family History  Problem Relation Age of Onset  . Hypertension Father   . Stroke Father        > 28  . Hypertension Mother   . Arthritis Mother   . Hypertension Sister   . Diabetes Paternal Grandmother   . Heart attack Maternal Grandfather        age unknown; ? > 41  . Stomach cancer Maternal Grandmother     SOCIAL HX: Non-smoker   Current Outpatient Medications:  .  albuterol (VENTOLIN HFA) 108 (90 Base) MCG/ACT inhaler, INHALE 2 PUFFS BY MOUTH EVERY 6 HOURS AS NEEDED FOR WHEEZING OR SHORTNESS OF BREATH, Disp: 20.1 g, Rfl: 0 .  Alum Hydroxide-Mag Carbonate (GAVISCON PO), Take 1 Dose by mouth as directed. , Disp: , Rfl:  .  amLODipine (NORVASC) 5 MG tablet, TAKE 1 TABLET(5 MG) BY MOUTH DAILY, Disp: 90 tablet, Rfl: 1 .  aspirin 81 MG tablet, Take 81 mg by mouth daily.  , Disp: , Rfl:  .  Aspirin-Acetaminophen-Caffeine (EXCEDRIN PO), Take 1 Dose by mouth as directed. , Disp: , Rfl:  .  fluticasone (FLONASE) 50 MCG/ACT nasal spray, SHAKE LIQUID AND USE 2 SPRAYS IN EACH NOSTRIL DAILY, Disp: 48 g, Rfl: 1 .  furosemide (LASIX) 40 MG tablet, TAKE 1 TABLET(40 MG) BY MOUTH DAILY, Disp: 90 tablet, Rfl: 1 .  loratadine (CLARITIN) 10 MG tablet, Take 1 tablet (10 mg total) by mouth daily., Disp: 30 tablet, Rfl: 11 .  LORazepam (ATIVAN) 0.5 MG tablet, TAKE 1 TABLET(0.5 MG) BY MOUTH DAILY AS NEEDED FOR ANXIETY, Disp: 30 tablet, Rfl: 2 .  losartan  (COZAAR) 100 MG tablet, TAKE 1/2 TABLET(50 MG) BY MOUTH DAILY, Disp: 45 tablet, Rfl: 1 .  pravastatin (PRAVACHOL) 20 MG tablet, TAKE 1 TABLET(20 MG) BY MOUTH DAILY, Disp: 90 tablet, Rfl: 1 .  sertraline (ZOLOFT) 50 MG tablet, TAKE 1 TABLET(50 MG) BY MOUTH DAILY, Disp: 90 tablet, Rfl: 1 .  amoxicillin-clavulanate (AUGMENTIN) 875-125 MG tablet, Take 1 tablet by mouth 2 (two) times daily., Disp: 20 tablet, Rfl: 0  EXAM: This was a telephone visit and thus no physical exam was completed.  ASSESSMENT AND PLAN:  Discussed the following assessment and plan:  Left ear pain Suspect this is related to and otitis media given her symptoms and her history of similar symptoms with otitis media.  Discussed empirically treating with Augmentin for 10 days.  I also discussed having her get tested for COVID-19 to be on the safe side given her sore throat.  Discussed she could schedule an appointment at one of the [facility] for testing.  Advised to stay quarantined at home at least until she gets the test result back.  Discussed she should be improving with her ear within 2 to 3 days and if she is not improving by early next week she needs to contact her PCPs office.  Advised that she needs to complete the entire course of the Augmentin even if her symptoms resolve prior to 10 days.   No orders of the defined types were placed in this encounter.   Meds ordered this encounter  Medications  . amoxicillin-clavulanate (AUGMENTIN) 875-125 MG tablet    Sig: Take 1 tablet by mouth 2 (two) times daily.    Dispense:  20 tablet    Refill:  0     I discussed the assessment and treatment plan with the patient. The patient was provided an opportunity to ask questions and all were answered. The patient agreed with the plan and demonstrated an understanding of the instructions.   The patient was advised to call back or seek an in-person evaluation if the symptoms worsen or if the condition fails to  improve as anticipated.  I provided 8 minutes of non-face-to-face time during this encounter.   Tommi Rumps, MD

## 2020-04-10 DIAGNOSIS — Z20828 Contact with and (suspected) exposure to other viral communicable diseases: Secondary | ICD-10-CM | POA: Diagnosis not present

## 2020-05-31 ENCOUNTER — Other Ambulatory Visit: Payer: Self-pay | Admitting: Internal Medicine

## 2020-06-27 NOTE — Patient Instructions (Addendum)
  Blood work was ordered.     Medications changes include :   Increase losartan to 100 mg daily.  Start taking vitamin d 1000 units daily  Your prescription(s) have been submitted to your pharmacy. Please take as directed and contact our office if you believe you are having problem(s) with the medication(s).    Please followup in 6 months

## 2020-06-27 NOTE — Progress Notes (Signed)
Subjective:    Patient ID: [name], female    DOB: 1934/08/06, 85 y.o.   MRN: [id number]  HPI The patient is here for follow up of their chronic medical problems, including htn, hyperlipidemia, prediabetes, asthma, anxiety, depression, chronic pain, b/l knee OA   Her BP at home has been not controlled - 137/67 - 177/77.  She is taking all of her medications as prescribed.       Medications and allergies reviewed with patient and updated if appropriate.  Patient Active Problem List   Diagnosis Date Noted   Left ear pain 04/08/2020   Lightheadedness 09/22/2019   Pain in heel 04/08/2018   Pruritus 04/08/2018   Chronic fatigue 08/07/2016   DOE (dyspnea on exertion) 08/07/2016   Chronic knee pain, bilateral 12/29/2015   Prediabetes 10/13/2015   Anxiety and depression 10/13/2015   NONSPECIFIC ABNORMAL ELECTROCARDIOGRAM 08/25/2009   Low back pain 11/20/2007   Hyperlipidemia 06/23/2007   Essential hypertension 06/23/2007   Anemia 05/27/2007   ALLERGIC RHINITIS 05/27/2007   Asthma 05/27/2007   CAROTID BRUIT, LEFT 99/24/2683   METABOLIC SYNDROME X 41/96/2229    Current Outpatient Medications on File Prior to Visit  Medication Sig Dispense Refill   albuterol (VENTOLIN HFA) 108 (90 Base) MCG/ACT inhaler INHALE 2 PUFFS BY MOUTH EVERY 6 HOURS AS NEEDED FOR WHEEZING OR SHORTNESS OF BREATH 20.1 g 0   Alum Hydroxide-Mag Carbonate (GAVISCON PO) Take 1 Dose by mouth as directed.     amLODipine (NORVASC) 5 MG tablet TAKE 1 TABLET(5 MG) BY MOUTH DAILY 90 tablet 1   aspirin 81 MG tablet Take 81 mg by mouth daily.     Aspirin-Acetaminophen-Caffeine (EXCEDRIN PO) Take 1 Dose by mouth as directed.     fluticasone (FLONASE) 50 MCG/ACT nasal spray SHAKE LIQUID AND USE 2 SPRAYS IN EACH NOSTRIL DAILY 48 g 1   furosemide (LASIX) 40 MG tablet TAKE 1 TABLET(40 MG) BY MOUTH DAILY 90 tablet 1   loratadine (CLARITIN) 10 MG tablet Take 1 tablet (10 mg total) by mouth  daily. 30 tablet 11   LORazepam (ATIVAN) 0.5 MG tablet TAKE 1 TABLET(0.5 MG) BY MOUTH DAILY AS NEEDED FOR ANXIETY 30 tablet 2   pravastatin (PRAVACHOL) 20 MG tablet TAKE 1 TABLET(20 MG) BY MOUTH DAILY 90 tablet 1   sertraline (ZOLOFT) 50 MG tablet TAKE 1 TABLET(50 MG) BY MOUTH DAILY 90 tablet 1   No current facility-administered medications on file prior to visit.    Past Medical History:  Diagnosis Date   Allergy    Anemia    Asthma    adult onset   Carotid bruit    left   Hyperlipidemia    LDL goal = < 120 based on NMR Lipoprofile 2005   Hypertension    Metabolic syndrome     Past Surgical History:  Procedure Laterality Date   CARDIAC CATHETERIZATION  2006   negative   CATARACT EXTRACTION, BILATERAL     Dr Bing Plume   COLONOSCOPY  06/02/2006   Dr.Kaplan,Next Due date 06/02/2016   SHOULDER SURGERY  11/21/2008   Dr.Aplington   TOTAL ABDOMINAL HYSTERECTOMY W/ BILATERAL SALPINGOOPHORECTOMY  1983   Dr.Fore, for fibroids with pain   TOTAL HIP ARTHROPLASTY  2005, 2006    Social History   Socioeconomic History   Marital status: Widowed    Spouse name: Not on file   Number of children: Not on file   Years of education: Not on file   Highest education level:  Not on file  Occupational History   Not on file  Tobacco Use   Smoking status: Never Smoker   Smokeless tobacco: Never Used  Substance and Sexual Activity   Alcohol use: No   Drug use: No   Sexual activity: Not on file  Other Topics Concern   Not on file  Social History Narrative   Not on file   Social Determinants of Health   Financial Resource Strain: Not on file  Food Insecurity: Not on file  Transportation Needs: Not on file  Physical Activity: Not on file  Stress: Not on file  Social Connections: Not on file    Family History  Problem Relation Age of Onset   Hypertension Father    Stroke Father        > 81   Hypertension Mother    Arthritis Mother    Hypertension  Sister    Diabetes Paternal Grandmother    Heart attack Maternal Grandfather        age unknown; ? > 80   Stomach cancer Maternal Grandmother     Review of Systems  Constitutional: Negative for chills and fever.  Respiratory: Positive for shortness of breath (exertion). Negative for cough and wheezing.   Cardiovascular: Positive for leg swelling (occ). Negative for chest pain and palpitations.       Objective:   Vitals:   06/28/20 1359  BP: (!) 142/72  Pulse: 86  Temp: 98.6 F (37 C)  SpO2: 96%   BP Readings from Last 3 Encounters:  06/28/20 (!) 142/72  01/12/20 130/64  09/22/19 140/68   Wt Readings from Last 3 Encounters:  06/28/20 245 lb (111.1 kg)  04/08/20 244 lb (110.7 kg)  01/12/20 244 lb (110.7 kg)   Body mass index is 40.77 kg/m.   Physical Exam    Constitutional: Appears well-developed and well-nourished. No distress.  HENT:  Head: Normocephalic and atraumatic.  Neck: Neck supple. No tracheal deviation present. No thyromegaly present.  No cervical lymphadenopathy Cardiovascular: Normal rate, regular rhythm and normal heart sounds.   No murmur heard. No carotid bruit .  No edema Pulmonary/Chest: Effort normal and breath sounds normal. No respiratory distress. No has no wheezes. No rales.  Skin: Skin is warm and dry. Not diaphoretic.  Psychiatric: Normal mood and affect. Behavior is normal.      Assessment & Plan:    See Problem List for Assessment and Plan of chronic medical problems.    This visit occurred during the SARS-CoV-2 public health emergency.  Safety protocols were in place, including screening questions prior to the visit, additional usage of staff PPE, and extensive cleaning of exam room while observing appropriate contact time as indicated for disinfecting solutions.

## 2020-06-28 ENCOUNTER — Encounter: Payer: Self-pay | Admitting: Internal Medicine

## 2020-06-28 ENCOUNTER — Ambulatory Visit (INDEPENDENT_AMBULATORY_CARE_PROVIDER_SITE_OTHER): Payer: Medicare PPO | Admitting: Internal Medicine

## 2020-06-28 ENCOUNTER — Other Ambulatory Visit: Payer: Self-pay

## 2020-06-28 VITALS — BP 142/72 | HR 86 | Temp 98.6°F | Ht 65.0 in | Wt 245.0 lb

## 2020-06-28 DIAGNOSIS — R7303 Prediabetes: Secondary | ICD-10-CM | POA: Diagnosis not present

## 2020-06-28 DIAGNOSIS — G8929 Other chronic pain: Secondary | ICD-10-CM | POA: Diagnosis not present

## 2020-06-28 DIAGNOSIS — M25561 Pain in right knee: Secondary | ICD-10-CM | POA: Diagnosis not present

## 2020-06-28 DIAGNOSIS — J4520 Mild intermittent asthma, uncomplicated: Secondary | ICD-10-CM

## 2020-06-28 DIAGNOSIS — F419 Anxiety disorder, unspecified: Secondary | ICD-10-CM

## 2020-06-28 DIAGNOSIS — M25562 Pain in left knee: Secondary | ICD-10-CM

## 2020-06-28 DIAGNOSIS — I10 Essential (primary) hypertension: Secondary | ICD-10-CM | POA: Diagnosis not present

## 2020-06-28 DIAGNOSIS — F32A Depression, unspecified: Secondary | ICD-10-CM | POA: Diagnosis not present

## 2020-06-28 DIAGNOSIS — E7849 Other hyperlipidemia: Secondary | ICD-10-CM | POA: Diagnosis not present

## 2020-06-28 LAB — CBC WITH DIFFERENTIAL/PLATELET
Basophils Absolute: 0.1 10*3/uL (ref 0.0–0.1)
Basophils Relative: 0.8 % (ref 0.0–3.0)
Eosinophils Absolute: 0.1 10*3/uL (ref 0.0–0.7)
Eosinophils Relative: 1.6 % (ref 0.0–5.0)
HCT: 35.7 % — ABNORMAL LOW (ref 36.0–46.0)
Hemoglobin: 11.6 g/dL — ABNORMAL LOW (ref 12.0–15.0)
Lymphocytes Relative: 18.7 % (ref 12.0–46.0)
Lymphs Abs: 1.6 10*3/uL (ref 0.7–4.0)
MCHC: 32.5 g/dL (ref 30.0–36.0)
MCV: 94.1 fl (ref 78.0–100.0)
Monocytes Absolute: 0.6 10*3/uL (ref 0.1–1.0)
Monocytes Relative: 7.5 % (ref 3.0–12.0)
Neutro Abs: 6.1 10*3/uL (ref 1.4–7.7)
Neutrophils Relative %: 71.4 % (ref 43.0–77.0)
Platelets: 307 10*3/uL (ref 150.0–400.0)
RBC: 3.79 Mil/uL — ABNORMAL LOW (ref 3.87–5.11)
RDW: 12.1 % (ref 11.5–15.5)
WBC: 8.5 10*3/uL (ref 4.0–10.5)

## 2020-06-28 LAB — COMPREHENSIVE METABOLIC PANEL
ALT: 9 U/L (ref 0–35)
AST: 13 U/L (ref 0–37)
Albumin: 4.2 g/dL (ref 3.5–5.2)
Alkaline Phosphatase: 128 U/L — ABNORMAL HIGH (ref 39–117)
BUN: 22 mg/dL (ref 6–23)
CO2: 29 mEq/L (ref 19–32)
Calcium: 9.3 mg/dL (ref 8.4–10.5)
Chloride: 101 mEq/L (ref 96–112)
Creatinine, Ser: 1.1 mg/dL (ref 0.40–1.20)
GFR: 46.34 mL/min — ABNORMAL LOW (ref >60.00)
Glucose, Bld: 145 mg/dL — ABNORMAL HIGH (ref 70–99)
Potassium: 4 mEq/L (ref 3.5–5.1)
Sodium: 136 mEq/L (ref 135–145)
Total Bilirubin: 0.8 mg/dL (ref 0.2–1.2)
Total Protein: 7.7 g/dL (ref 6.0–8.3)

## 2020-06-28 LAB — LIPID PANEL
Cholesterol: 155 mg/dL (ref 0–200)
HDL: 31.7 mg/dL — ABNORMAL LOW (ref >39.00)
LDL Cholesterol: 102 mg/dL — ABNORMAL HIGH (ref 0–99)
NonHDL: 123.47
Total CHOL/HDL Ratio: 5
Triglycerides: 109 mg/dL (ref 0.0–149.0)
VLDL: 21.8 mg/dL (ref 0.0–40.0)

## 2020-06-28 LAB — HEMOGLOBIN A1C: Hgb A1c MFr Bld: 6.4 % (ref 4.6–6.5)

## 2020-06-28 MED ORDER — LOSARTAN POTASSIUM 100 MG PO TABS: 100.0000 mg | ORAL_TABLET | Freq: Every day | ORAL | 3 refills | Status: DC

## 2020-06-28 NOTE — Assessment & Plan Note (Signed)
Chronic Uses cane Limited in what she can do Sees ortho - will get repeat gel injections which did help Taking tylenol

## 2020-06-28 NOTE — Assessment & Plan Note (Signed)
Chronic Check lipid panel  Continue pravastatin 20 mg daily Regular exercise and healthy diet encouraged  

## 2020-06-28 NOTE — Assessment & Plan Note (Signed)
Chronic Check a1c Low sugar / carb diet 

## 2020-06-28 NOTE — Assessment & Plan Note (Signed)
Chronic BP not controlled Continue amlodipine 5 mg qd, lasix 40 mg qd and increase losartan to 100 mg daily cmp

## 2020-06-28 NOTE — Addendum Note (Signed)
Addended by: Boris Lown B on: 06/28/2020 02:52 PM   Modules accepted: Orders

## 2020-06-28 NOTE — Assessment & Plan Note (Signed)
Chronic Mild, intermittent Uses albuterol infrequently - which is not often controlled Continue albuterol prn

## 2020-06-28 NOTE — Assessment & Plan Note (Signed)
Chronic Controlled, stable Continue sertraline 50 mg daily and lorazepam 0.5 mg nightly

## 2020-07-06 ENCOUNTER — Other Ambulatory Visit: Payer: Self-pay | Admitting: Internal Medicine

## 2020-07-17 ENCOUNTER — Ambulatory Visit: Payer: TRICARE For Life (TFL) | Admitting: Internal Medicine

## 2020-08-05 ENCOUNTER — Other Ambulatory Visit: Payer: Self-pay | Admitting: Internal Medicine

## 2020-08-07 ENCOUNTER — Other Ambulatory Visit: Payer: Self-pay | Admitting: Internal Medicine

## 2020-08-07 ENCOUNTER — Other Ambulatory Visit: Payer: Self-pay

## 2020-08-07 MED ORDER — FUROSEMIDE 40 MG PO TABS: ORAL_TABLET | ORAL | 1 refills | Status: DC

## 2020-08-07 MED ORDER — PRAVASTATIN SODIUM 20 MG PO TABS: ORAL_TABLET | ORAL | 1 refills | Status: DC

## 2020-08-15 ENCOUNTER — Telehealth: Payer: Self-pay | Admitting: Internal Medicine

## 2020-08-15 NOTE — Telephone Encounter (Signed)
Called to schedule AWV with [facility], but pt's vmb was full. I was unable to LVM. Please schedule this appt if pt calls the office.

## 2020-08-30 ENCOUNTER — Telehealth: Payer: Self-pay | Admitting: Internal Medicine

## 2020-08-30 ENCOUNTER — Ambulatory Visit (INDEPENDENT_AMBULATORY_CARE_PROVIDER_SITE_OTHER): Payer: Medicare PPO | Admitting: Internal Medicine

## 2020-08-30 ENCOUNTER — Other Ambulatory Visit: Payer: Self-pay

## 2020-08-30 ENCOUNTER — Encounter: Payer: Self-pay | Admitting: Internal Medicine

## 2020-08-30 DIAGNOSIS — N1831 Chronic kidney disease, stage 3a: Secondary | ICD-10-CM | POA: Diagnosis not present

## 2020-08-30 DIAGNOSIS — R7303 Prediabetes: Secondary | ICD-10-CM

## 2020-08-30 DIAGNOSIS — N183 Chronic kidney disease, stage 3 unspecified: Secondary | ICD-10-CM | POA: Insufficient documentation

## 2020-08-30 DIAGNOSIS — I10 Essential (primary) hypertension: Secondary | ICD-10-CM

## 2020-08-30 DIAGNOSIS — M25562 Pain in left knee: Secondary | ICD-10-CM | POA: Diagnosis not present

## 2020-08-30 DIAGNOSIS — N189 Chronic kidney disease, unspecified: Secondary | ICD-10-CM | POA: Insufficient documentation

## 2020-08-30 MED ORDER — METHYLPREDNISOLONE 4 MG PO TBPK: ORAL_TABLET | ORAL | 0 refills | Status: DC

## 2020-08-30 MED ORDER — HYDROCODONE-ACETAMINOPHEN 5-325 MG PO TABS: 1.0000 | ORAL_TABLET | Freq: Four times a day (QID) | ORAL | 0 refills | Status: DC | PRN

## 2020-08-30 MED ORDER — LIDOCAINE-EPINEPHRINE 2 %-1:100000 IJ SOLN
2.0000 mL | Freq: Once | INTRAMUSCULAR | Status: AC
  Administered 2020-08-30: 2 mL

## 2020-08-30 MED ORDER — VITAMIN D3 50 MCG (2000 UT) PO CAPS: 2000.0000 [IU] | ORAL_CAPSULE | Freq: Every day | ORAL | 3 refills | Status: AC

## 2020-08-30 MED ORDER — METHYLPREDNISOLONE ACETATE 80 MG/ML IJ SUSP
80.0000 mg | Freq: Once | INTRAMUSCULAR | Status: AC
  Administered 2020-08-30: 80 mg via INTRAMUSCULAR

## 2020-08-30 NOTE — Assessment & Plan Note (Signed)
Medrol increase glucose levels.  She was made aware.

## 2020-08-30 NOTE — Assessment & Plan Note (Addendum)
?  acute OA flare vs other. Will inject Medrol pack Norco po prn F/u w/Dr Quay Burow Start Vit D

## 2020-08-30 NOTE — Addendum Note (Signed)
Addended by: Earnstine Regal on: 08/30/2020 03:16 PM   Modules accepted: Orders

## 2020-08-30 NOTE — Progress Notes (Signed)
Subjective:  Patient ID: [name], female    DOB: Aug 01, 1934  Age: 86 y.o. MRN: [id number]  CC: Leg Swelling ((L) LEG ALONG W/PAIN. PT STATES SWELLING STARTED 3 DAYS AGO)   HPI [name] presents for L knee severe pain and swelling x 2 days  Outpatient Medications Prior to Visit  Medication Sig Dispense Refill  . albuterol (VENTOLIN HFA) 108 (90 Base) MCG/ACT inhaler INHALE 2 PUFFS BY MOUTH EVERY 6 HOURS AS NEEDED FOR WHEEZING OR SHORTNESS OF BREATH 20.1 g 0  . Alum Hydroxide-Mag Carbonate (GAVISCON PO) Take 1 Dose by mouth as directed.    [name] amLODipine (NORVASC) 5 MG tablet TAKE 1 TABLET(5 MG) BY MOUTH DAILY 90 tablet 1  . aspirin 81 MG tablet Take 81 mg by mouth daily.    . Aspirin-Acetaminophen-Caffeine (EXCEDRIN PO) Take 1 Dose by mouth as directed.    . fluticasone (FLONASE) 50 MCG/ACT nasal spray SHAKE LIQUID AND USE 2 SPRAYS IN EACH NOSTRIL DAILY 48 g 1  . furosemide (LASIX) 40 MG tablet TAKE 1 TABLET(40 MG) BY MOUTH DAILY 90 tablet 1  . loratadine (CLARITIN) 10 MG tablet Take 1 tablet (10 mg total) by mouth daily. 30 tablet 11  . LORazepam (ATIVAN) 0.5 MG tablet TAKE 1 TABLET(0.5 MG) BY MOUTH DAILY AS NEEDED FOR ANXIETY 30 tablet 0  . losartan (COZAAR) 100 MG tablet Take 1 tablet (100 mg total) by mouth daily. 90 tablet 3  . pravastatin (PRAVACHOL) 20 MG tablet TAKE 1 TABLET(20 MG) BY MOUTH DAILY 90 tablet 1  . sertraline (ZOLOFT) 50 MG tablet TAKE 1 TABLET(50 MG) BY MOUTH DAILY 90 tablet 1   No facility-administered medications prior to visit.    ROS: Review of Systems  Constitutional: Negative for activity change, appetite change, chills, fatigue and unexpected weight change.  HENT: Negative for congestion, mouth sores and sinus pressure.   Eyes: Negative for visual disturbance.  Respiratory: Negative for cough and chest tightness.   Gastrointestinal: Negative for abdominal pain and nausea.  Genitourinary: Negative for difficulty urinating, frequency and vaginal  pain.  Musculoskeletal: Positive for arthralgias and gait problem. Negative for back pain.  Skin: Negative for pallor and rash.  Neurological: Negative for dizziness, tremors, weakness, numbness and headaches.  Psychiatric/Behavioral: Negative for confusion and sleep disturbance.    Objective:  BP 136/72 (BP Location: Left Arm)   Pulse 65   Temp 98 F (36.7 C) (Oral)   SpO2 97%   BP Readings from Last 3 Encounters:  08/30/20 136/72  06/28/20 (!) 142/72  01/12/20 130/64    Wt Readings from Last 3 Encounters:  06/28/20 245 lb (111.1 kg)  04/08/20 244 lb (110.7 kg)  01/12/20 244 lb (110.7 kg)    Physical Exam Constitutional:      General: She is not in acute distress.    Appearance: She is well-developed. She is obese.  HENT:     Head: Normocephalic.     Right Ear: External ear normal.     Left Ear: External ear normal.     Nose: Nose normal.     Mouth/Throat:     Mouth: Oropharynx is clear and moist.  Eyes:     General:        Right eye: No discharge.        Left eye: No discharge.     Conjunctiva/sclera: Conjunctivae normal.     Pupils: Pupils are equal, round, and reactive to light.  Neck:     Thyroid: No  thyromegaly.     Vascular: No JVD.     Trachea: No tracheal deviation.  Cardiovascular:     Rate and Rhythm: Normal rate and regular rhythm.     Heart sounds: Normal heart sounds.  Pulmonary:     Effort: No respiratory distress.     Breath sounds: No stridor. No wheezing.  Abdominal:     General: Bowel sounds are normal. There is no distension.     Palpations: Abdomen is soft. There is no mass.     Tenderness: There is no abdominal tenderness. There is no guarding or rebound.  Musculoskeletal:        General: Swelling and tenderness present. No edema.     Cervical back: Normal range of motion and neck supple.  Lymphadenopathy:     Cervical: No cervical adenopathy.  Skin:    Findings: No erythema or rash.  Neurological:     Mental Status: She is  oriented to person, place, and time.     Cranial Nerves: No cranial nerve deficit.     Motor: No abnormal muscle tone.     Gait: Gait abnormal.     Deep Tendon Reflexes: Reflexes normal.  Psychiatric:        Mood and Affect: Mood and affect normal.        Behavior: Behavior normal.        Thought Content: Thought content normal.        Judgment: Judgment normal.    In a w/c L knee is swollen and tender. POC [place] - small effusion Cane   Procedure Note :     Procedure : Joint Injection, L  knee   Indication:  Joint osteoarthritis with acute pain.   Risks including unsuccessful procedure , bleeding, infection, bruising, skin atrophy, "steroid flare-up" and others were explained to the patient in detail as well as the benefits. Informed consent was obtained verbally.  Tthe patient was placed in a comfortable position. Lateral approach was used. Skin was prepped with Betadine and alcohol  and anesthetized a cooling spray. Then, a 5 cc syringe with a 1.5 inch long 25-gauge needle was used for a joint injection.. The needle was advanced  Into the knee joint cavity. I aspirated a small amount of intra-articular fluid to confirm correct placement of the needle and injected the joint with 5 mL of 2% lidocaine and 80 mg of Depo-Medrol .  Band-Aid was applied.   Tolerated well. Complications: None. Good pain relief following the procedure.   Postprocedure instructions :    A Band-Aid should be left on for 12 hours. Injection therapy is not a cure itself. It is used in conjunction with other modalities. You can use nonsteroidal anti-inflammatories like ibuprofen , hot and cold compresses. Rest is recommended in the next 24 hours. You need to report immediately  if fever, chills or any signs of infection develop.   Lab Results  Component Value Date   WBC 8.5 06/28/2020   HGB 11.6 (L) 06/28/2020   HCT 35.7 (L) 06/28/2020   PLT 307.0 06/28/2020   GLUCOSE 145 (H) 06/28/2020   CHOL 155 06/28/2020    TRIG 109.0 06/28/2020   HDL 31.70 (L) 06/28/2020   LDLCALC 102 (H) 06/28/2020   ALT 9 06/28/2020   AST 13 06/28/2020   NA 136 06/28/2020   K 4.0 06/28/2020   CL 101 06/28/2020   CREATININE 1.10 06/28/2020   BUN 22 06/28/2020   CO2 29 06/28/2020   TSH 2.22 01/12/2020  HGBA1C 6.4 06/28/2020    MM 3D SCREEN BREAST BILATERAL  Result Date: 02/21/2020 CLINICAL DATA:  Screening. EXAM: DIGITAL SCREENING BILATERAL MAMMOGRAM WITH TOMO AND CAD COMPARISON:  Previous exam(s). ACR Breast Density Category b: There are scattered areas of fibroglandular density. FINDINGS: There are no findings suspicious for malignancy. Images were processed with CAD. IMPRESSION: No mammographic evidence of malignancy. A result letter of this screening mammogram will be mailed directly to the patient. RECOMMENDATION: Screening mammogram in one year. (Code:SM-B-01Y) BI-RADS CATEGORY  1: Negative. Electronically Signed   By: Curlene Dolphin M.D.   On: 02/21/2020 15:04    Assessment & Plan:    Follow-up: No follow-ups on file.  Walker Kehr, MD

## 2020-08-30 NOTE — Patient Instructions (Signed)
Postprocedure instructions :    A Band-Aid should be left on for 12 hours. Injection therapy is not a cure itself. It is used in conjunction with other modalities. You can use nonsteroidal anti-inflammatories like ibuprofen , hot and cold compresses. Rest is recommended in the next 24 hours. You need to report immediately  if fever, chills or any signs of infection develop. 

## 2020-08-30 NOTE — Assessment & Plan Note (Signed)
BP Readings from Last 3 Encounters:  08/30/20 136/72  06/28/20 (!) 142/72  01/12/20 130/64  Continue with losartan

## 2020-08-30 NOTE — Assessment & Plan Note (Signed)
No NSAIDs - discussed

## 2020-08-30 NOTE — Telephone Encounter (Signed)
Spoke with patient today.  I put her in to see Dr. Alain Marion this afternoon at 1:40 pm.  She is to call back and let me know if she can make it in or not.  If can't make it please let me know so another provider can advise.

## 2020-08-30 NOTE — Telephone Encounter (Signed)
Patient called and said she is having some swelling and pain on her left leg. She has an appointment with Dr. Jenny Reichmann on 2.18.22. She said she has been putting an ice pack on the swelling and was wondering if there was anything else she needed to do in the mean time. She can be reached at [phone]. Please advise

## 2020-09-01 ENCOUNTER — Telehealth: Payer: Self-pay | Admitting: Internal Medicine

## 2020-09-01 ENCOUNTER — Ambulatory Visit: Payer: Medicare PPO | Admitting: Internal Medicine

## 2020-09-01 NOTE — Telephone Encounter (Signed)
Patient called and was wondering if a fax was received for HYDROcodone-acetaminophen (NORCO/VICODIN) 5-325 MG tablet. She said that the pharmacy faxed it over for Dr. Alain Marion. Please advise     [facility] [place], [place]

## 2020-09-04 NOTE — Telephone Encounter (Signed)
PA has been received. Will completed via cover my meds.Marland KitchenJohny Chess

## 2020-09-05 NOTE — Telephone Encounter (Signed)
Completed PA via cover-my-meds w/ (Key: [id number]). Received msg stating " Your information has been submitted to [facility]. [employer] will review the request and will issue a decision, typically within 3-7 days from your submission"..Princella Pellegrini

## 2020-09-06 NOTE — Telephone Encounter (Signed)
Rec'd PA back med was approved through 07/14/21. Faxed to pharmacy and notified pt.Marland KitchenJohny Chess

## 2020-09-15 ENCOUNTER — Ambulatory Visit: Payer: Medicare PPO | Admitting: Internal Medicine

## 2020-09-28 ENCOUNTER — Ambulatory Visit: Payer: TRICARE For Life (TFL) | Admitting: Internal Medicine

## 2020-09-28 NOTE — Progress Notes (Signed)
Subjective:    Patient ID: [name], female    DOB: Dec 04, 1934, 86 y.o.   MRN: [id number]  HPI The patient is here for follow up of left leg swelling and pain.   It started mid Feb and she saw Dr Alain Marion. She had laid down and when she stood up she could not.  He diagnosed OA with acute pain - OA flare vs other.  He gave a steroid injection.  She was prescribed a medrol pak, norco prn.    The left knee is better.  It is not swollen. Her pain is at the baseline.      Intermittent left temple pain - comes and goes.  Not sharp.  Does not always take anything for it.  Sometimes takes excedrin.  Takes two tylenol arthritis in am and night.  She denies any visual changes, nausea denies any jaw pain.     Medications and allergies reviewed with patient and updated if appropriate.  Patient Active Problem List   Diagnosis Date Noted  . Aortic atherosclerosis ([place]) 09/29/2020  . Left knee pain 08/30/2020  . CRI (chronic renal insufficiency) 08/30/2020  . Lightheadedness 09/22/2019  . Pruritus 04/08/2018  . Chronic fatigue 08/07/2016  . DOE (dyspnea on exertion) 08/07/2016  . Chronic knee pain, bilateral 12/29/2015  . Prediabetes 10/13/2015  . Anxiety and depression 10/13/2015  . NONSPECIFIC ABNORMAL ELECTROCARDIOGRAM 08/25/2009  . Low back pain 11/20/2007  . Hyperlipidemia 06/23/2007  . Essential hypertension 06/23/2007  . Anemia 05/27/2007  . ALLERGIC RHINITIS 05/27/2007  . Asthma 05/27/2007  . CAROTID BRUIT, LEFT 05/27/2007  . METABOLIC SYNDROME X 62/83/6629    Current Outpatient Medications on File Prior to Visit  Medication Sig Dispense Refill  . albuterol (VENTOLIN HFA) 108 (90 Base) MCG/ACT inhaler INHALE 2 PUFFS BY MOUTH EVERY 6 HOURS AS NEEDED FOR WHEEZING OR SHORTNESS OF BREATH 20.1 g 0  . Alum Hydroxide-Mag Carbonate (GAVISCON PO) Take 1 Dose by mouth as directed.    [name] amLODipine (NORVASC) 5 MG tablet TAKE 1 TABLET(5 MG) BY MOUTH DAILY 90 tablet 1  . aspirin 81 MG  tablet Take 81 mg by mouth daily.    . Aspirin-Acetaminophen-Caffeine (EXCEDRIN PO) Take 1 Dose by mouth as directed.    . Cholecalciferol (VITAMIN D3) 50 MCG (2000 UT) capsule Take 1 capsule (2,000 Units total) by mouth daily. 100 capsule 3  . fluticasone (FLONASE) 50 MCG/ACT nasal spray SHAKE LIQUID AND USE 2 SPRAYS IN EACH NOSTRIL DAILY 48 g 1  . furosemide (LASIX) 40 MG tablet TAKE 1 TABLET(40 MG) BY MOUTH DAILY 90 tablet 1  . HYDROcodone-acetaminophen (NORCO/VICODIN) 5-325 MG tablet Take 1 tablet by mouth every 6 (six) hours as needed for severe pain. 20 tablet 0  . loratadine (CLARITIN) 10 MG tablet Take 1 tablet (10 mg total) by mouth daily. 30 tablet 11  . LORazepam (ATIVAN) 0.5 MG tablet TAKE 1 TABLET(0.5 MG) BY MOUTH DAILY AS NEEDED FOR ANXIETY 30 tablet 0  . losartan (COZAAR) 100 MG tablet Take 1 tablet (100 mg total) by mouth daily. 90 tablet 3  . methylPREDNISolone (MEDROL DOSEPAK) 4 MG TBPK tablet As directed 21 tablet 0  . pravastatin (PRAVACHOL) 20 MG tablet TAKE 1 TABLET(20 MG) BY MOUTH DAILY 90 tablet 1  . sertraline (ZOLOFT) 50 MG tablet TAKE 1 TABLET(50 MG) BY MOUTH DAILY 90 tablet 1   No current facility-administered medications on file prior to visit.    Past Medical History:  Diagnosis Date  .  Allergy   . Anemia   . Asthma    adult onset  . Carotid bruit    left  . Hyperlipidemia    LDL goal = < 120 based on NMR Lipoprofile 2005  . Hypertension   . Metabolic syndrome     Past Surgical History:  Procedure Laterality Date  . CARDIAC CATHETERIZATION  2006   negative  . CATARACT EXTRACTION, BILATERAL     Dr Bing Plume  . COLONOSCOPY  06/02/2006   Dr.Kaplan,Next Due date 06/02/2016  . SHOULDER SURGERY  11/21/2008   Dr.Aplington  . TOTAL ABDOMINAL HYSTERECTOMY W/ BILATERAL SALPINGOOPHORECTOMY  1983   Dr.Fore, for fibroids with pain  . TOTAL HIP ARTHROPLASTY  2005, 2006    Social History   Socioeconomic History  . Marital status: Widowed    Spouse name: Not  on file  . Number of children: Not on file  . Years of education: Not on file  . Highest education level: Not on file  Occupational History  . Not on file  Tobacco Use  . Smoking status: Never Smoker  . Smokeless tobacco: Never Used  Substance and Sexual Activity  . Alcohol use: No  . Drug use: No  . Sexual activity: Not on file  Other Topics Concern  . Not on file  Social History Narrative  . Not on file   Social Determinants of Health   Financial Resource Strain: Not on file  Food Insecurity: Not on file  Transportation Needs: Not on file  Physical Activity: Not on file  Stress: Not on file  Social Connections: Not on file    Family History  Problem Relation Age of Onset  . Hypertension Father   . Stroke Father        > 62  . Hypertension Mother   . Arthritis Mother   . Hypertension Sister   . Diabetes Paternal Grandmother   . Heart attack Maternal Grandfather        age unknown; ? > 81  . Stomach cancer Maternal Grandmother     Review of Systems  Constitutional: Negative for fever.  Eyes: Negative for visual disturbance.  Gastrointestinal: Negative for nausea.  Neurological: Positive for headaches. Negative for dizziness and light-headedness.       Objective:   Vitals:   09/29/20 1552  BP: 134/80  Pulse: 62  Temp: 98 F (36.7 C)  SpO2: 96%   BP Readings from Last 3 Encounters:  09/29/20 134/80  08/30/20 136/72  06/28/20 (!) 142/72   Wt Readings from Last 3 Encounters:  09/29/20 240 lb (108.9 kg)  06/28/20 245 lb (111.1 kg)  04/08/20 244 lb (110.7 kg)   Body mass index is 39.94 kg/m.   Physical Exam    Constitutional: Appears well-developed and well-nourished. No distress. Head: No tenderness with palpation left temple Musculoskeletal: Left knee without effusion or swelling, no warmth, no tenderness with palpation.  At baseline per patient Skin: Skin is warm and dry. Not diaphoretic.      Assessment & Plan:    See Problem List  for Assessment and Plan of chronic medical problems.    This visit occurred during the SARS-CoV-2 public health emergency.  Safety protocols were in place, including screening questions prior to the visit, additional usage of staff PPE, and extensive cleaning of exam room while observing appropriate contact time as indicated for disinfecting solutions.

## 2020-09-29 ENCOUNTER — Encounter: Payer: Self-pay | Admitting: Internal Medicine

## 2020-09-29 ENCOUNTER — Other Ambulatory Visit: Payer: Self-pay

## 2020-09-29 ENCOUNTER — Ambulatory Visit (INDEPENDENT_AMBULATORY_CARE_PROVIDER_SITE_OTHER): Payer: Medicare PPO | Admitting: Internal Medicine

## 2020-09-29 VITALS — BP 134/80 | HR 62 | Temp 98.0°F | Ht 65.0 in | Wt 240.0 lb

## 2020-09-29 DIAGNOSIS — M25562 Pain in left knee: Secondary | ICD-10-CM

## 2020-09-29 DIAGNOSIS — R519 Headache, unspecified: Secondary | ICD-10-CM

## 2020-09-29 DIAGNOSIS — I700 Atherosclerosis of aorta: Secondary | ICD-10-CM | POA: Insufficient documentation

## 2020-09-29 NOTE — Assessment & Plan Note (Signed)
Improved-knee is back at baseline Discussed seeing orthopedics at this time or waiting and watching to see if and when there is another flare-she would just like to hold off for now She would consider pursuing gel injections in the future if needed She does not want to return to the orthopedic Can refer to sports medicine if needed in the future Continue Tylenol arthritis twice daily Encouraged icing of the knee when needed

## 2020-09-29 NOTE — Assessment & Plan Note (Deleted)
Chronic Continue pravastatin 20 mg daily LDL not at goal Discussed with her diagnosis and recommended increasing statin to 40 mg daily

## 2020-09-29 NOTE — Assessment & Plan Note (Signed)
Acute She has been having intermittent head pain in her left temple region-no associated symptoms including changes in vision, nausea or or jaw pain Not occurring daily Typically does not take anything for it, but can take Excedrin or Tylenol Does not have any concerning qualities so at this point she will just monitor and let me know if this worsens or becomes more persistent Patient agreed with plan

## 2020-09-29 NOTE — Patient Instructions (Signed)
Let us know if your knee get worse.

## 2020-10-04 ENCOUNTER — Other Ambulatory Visit: Payer: Self-pay | Admitting: Internal Medicine

## 2020-10-04 DIAGNOSIS — H524 Presbyopia: Secondary | ICD-10-CM | POA: Diagnosis not present

## 2020-10-04 DIAGNOSIS — H04123 Dry eye syndrome of bilateral lacrimal glands: Secondary | ICD-10-CM | POA: Diagnosis not present

## 2020-10-04 DIAGNOSIS — D3132 Benign neoplasm of left choroid: Secondary | ICD-10-CM | POA: Diagnosis not present

## 2020-10-04 DIAGNOSIS — H35033 Hypertensive retinopathy, bilateral: Secondary | ICD-10-CM | POA: Diagnosis not present

## 2020-10-04 DIAGNOSIS — H26493 Other secondary cataract, bilateral: Secondary | ICD-10-CM | POA: Diagnosis not present

## 2020-11-07 ENCOUNTER — Other Ambulatory Visit: Payer: Self-pay | Admitting: Internal Medicine

## 2020-11-23 ENCOUNTER — Other Ambulatory Visit: Payer: Self-pay | Admitting: Internal Medicine

## 2020-12-08 ENCOUNTER — Other Ambulatory Visit: Payer: Self-pay

## 2020-12-08 ENCOUNTER — Ambulatory Visit (INDEPENDENT_AMBULATORY_CARE_PROVIDER_SITE_OTHER): Payer: Medicare PPO | Admitting: Internal Medicine

## 2020-12-08 ENCOUNTER — Encounter: Payer: Self-pay | Admitting: Internal Medicine

## 2020-12-08 DIAGNOSIS — H9201 Otalgia, right ear: Secondary | ICD-10-CM | POA: Diagnosis not present

## 2020-12-08 MED ORDER — PREDNISONE 20 MG PO TABS: 20.0000 mg | ORAL_TABLET | Freq: Every day | ORAL | 0 refills | Status: AC

## 2020-12-08 NOTE — Assessment & Plan Note (Addendum)
Could be TMJ related to partial. No signs of sinus problems on exam. No temporal tenderness to suggest temporal arteritis. Rx prednisone and advised okay to use ibuprofen for this pain.

## 2020-12-08 NOTE — Progress Notes (Signed)
   Subjective:   Patient ID: [name], female    DOB: 1935/06/04, 86 y.o.   MRN: [id number]  HPI The patient is an 86 YO female coming in for concerns about right ear pain. Started Monday evening. Has tried tylenol and advil. Denies headaches or vision changes. Using advil and this helps. Taking it once a day and symptoms mostly go away. Denies fevers or chills. Denies hearing changes. Denies change in sinus congestion does have this chronically. Denies new dental problem but has partial and broken tooth not new.  Review of Systems  Constitutional: Negative.   HENT: Positive for ear pain.   Eyes: Negative.   Respiratory: Negative for cough, chest tightness and shortness of breath.   Cardiovascular: Negative for chest pain, palpitations and leg swelling.  Gastrointestinal: Negative for abdominal distention, abdominal pain, constipation, diarrhea, nausea and vomiting.  Musculoskeletal: Negative.   Skin: Negative.   Neurological: Negative.   Psychiatric/Behavioral: Negative.     Objective:  Physical Exam Constitutional:      Appearance: She is well-developed.  HENT:     Head: Normocephalic and atraumatic.     Right Ear: Tympanic membrane, ear canal and external ear normal.     Left Ear: Tympanic membrane normal.     Ears:     Comments: No temporal tenderness Cardiovascular:     Rate and Rhythm: Normal rate and regular rhythm.  Pulmonary:     Effort: Pulmonary effort is normal. No respiratory distress.     Breath sounds: Normal breath sounds. No wheezing or rales.  Abdominal:     General: Bowel sounds are normal. There is no distension.     Palpations: Abdomen is soft.     Tenderness: There is no abdominal tenderness. There is no rebound.  Musculoskeletal:     Cervical back: Normal range of motion.  Skin:    General: Skin is warm and dry.  Neurological:     Mental Status: She is alert and oriented to person, place, and time.     Coordination: Coordination normal.      Vitals:   12/08/20 0936  BP: 132/80  Pulse: 64  Resp: 18  Temp: 98.4 F (36.9 C)  TempSrc: Oral  SpO2: 96%  Weight: 248 lb 9.6 oz (112.8 kg)  Height: 5\' 5"  (1.651 m)    This visit occurred during the SARS-CoV-2 public health emergency.  Safety protocols were in place, including screening questions prior to the visit, additional usage of staff PPE, and extensive cleaning of exam room while observing appropriate contact time as indicated for disinfecting solutions.   Assessment & Plan:

## 2020-12-08 NOTE — Patient Instructions (Signed)
We have sent in prednisone to take 1 pill daily for 5 days and this should clear up the pain. It is okay to take tylenol or ibuprofen for pain in the meantime.

## 2020-12-26 NOTE — Progress Notes (Signed)
Subjective:    Patient ID: [name], female    DOB: Apr 30, 1935, 86 y.o.   MRN: [id number]  HPI The patient is here for follow up of their chronic medical problems, including htn, hld, prediabetes, asthma, anxiety, depression, chronic b/l knee OA, CRI  She takes tylenol.  She take 400 mg advil daily a few times a week.    Medications and allergies reviewed with patient and updated if appropriate.  Patient Active Problem List   Diagnosis Date Noted   Right ear pain 12/08/2020   Aortic atherosclerosis ([place]) 09/29/2020   Head pain 09/29/2020   Left knee pain 08/30/2020   CKD (chronic kidney disease) stage 3, GFR 30-59 ml/min ([place]) 08/30/2020   Lightheadedness 09/22/2019   Pruritus 04/08/2018   Chronic fatigue 08/07/2016   DOE (dyspnea on exertion) 08/07/2016   Chronic knee pain, bilateral 12/29/2015   Prediabetes 10/13/2015   Anxiety and depression 10/13/2015   NONSPECIFIC ABNORMAL ELECTROCARDIOGRAM 08/25/2009   Low back pain 11/20/2007   Hyperlipidemia 06/23/2007   Essential hypertension 06/23/2007   Anemia 05/27/2007   ALLERGIC RHINITIS 05/27/2007   Asthma 05/27/2007   CAROTID BRUIT, LEFT 30/16/0109   METABOLIC SYNDROME X 32/35/5732    Current Outpatient Medications on File Prior to Visit  Medication Sig Dispense Refill   albuterol (VENTOLIN HFA) 108 (90 Base) MCG/ACT inhaler INHALE 2 PUFFS BY MOUTH EVERY 6 HOURS AS NEEDED FOR WHEEZING OR SHORTNESS OF BREATH 20.1 g 0   Alum Hydroxide-Mag Carbonate (GAVISCON PO) Take 1 Dose by mouth as directed.     amLODipine (NORVASC) 5 MG tablet TAKE 1 TABLET(5 MG) BY MOUTH DAILY 90 tablet 1   aspirin 81 MG tablet Take 81 mg by mouth daily.     Aspirin-Acetaminophen-Caffeine (EXCEDRIN PO) Take 1 Dose by mouth as directed.     Cholecalciferol (VITAMIN D3) 50 MCG (2000 UT) capsule Take 1 capsule (2,000 Units total) by mouth daily. 100 capsule 3   fluticasone (FLONASE) 50 MCG/ACT nasal spray SHAKE LIQUID AND USE 2 SPRAYS IN EACH  NOSTRIL DAILY 48 g 1   furosemide (LASIX) 40 MG tablet TAKE 1 TABLET(40 MG) BY MOUTH DAILY 90 tablet 1   loratadine (CLARITIN) 10 MG tablet Take 1 tablet (10 mg total) by mouth daily. 30 tablet 11   LORazepam (ATIVAN) 0.5 MG tablet TAKE 1 TABLET(0.5 MG) BY MOUTH DAILY AS NEEDED FOR ANXIETY 30 tablet 1   losartan (COZAAR) 100 MG tablet Take 1 tablet (100 mg total) by mouth daily. 90 tablet 3   pravastatin (PRAVACHOL) 20 MG tablet TAKE 1 TABLET(20 MG) BY MOUTH DAILY 90 tablet 1   sertraline (ZOLOFT) 50 MG tablet TAKE 1 TABLET(50 MG) BY MOUTH DAILY 90 tablet 1   No current facility-administered medications on file prior to visit.    Past Medical History:  Diagnosis Date   Allergy    Anemia    Asthma    adult onset   Carotid bruit    left   Hyperlipidemia    LDL goal = < 120 based on NMR Lipoprofile 2005   Hypertension    Metabolic syndrome     Past Surgical History:  Procedure Laterality Date   CARDIAC CATHETERIZATION  2006   negative   CATARACT EXTRACTION, BILATERAL     Dr Bing Plume   COLONOSCOPY  06/02/2006   Dr.Kaplan,Next Due date 06/02/2016   SHOULDER SURGERY  11/21/2008   Dr.Aplington   TOTAL ABDOMINAL HYSTERECTOMY W/ BILATERAL SALPINGOOPHORECTOMY  1983   Dr.Fore, for  fibroids with pain   TOTAL HIP ARTHROPLASTY  2005, 2006    Social History   Socioeconomic History   Marital status: Widowed    Spouse name: Not on file   Number of children: Not on file   Years of education: Not on file   Highest education level: Not on file  Occupational History   Not on file  Tobacco Use   Smoking status: Never   Smokeless tobacco: Never  Substance and Sexual Activity   Alcohol use: No   Drug use: No   Sexual activity: Not on file  Other Topics Concern   Not on file  Social History Narrative   Not on file   Social Determinants of Health   Financial Resource Strain: Not on file  Food Insecurity: Not on file  Transportation Needs: Not on file  Physical Activity: Not on  file  Stress: Not on file  Social Connections: Not on file    Family History  Problem Relation Age of Onset   Hypertension Father    Stroke Father        > 53   Hypertension Mother    Arthritis Mother    Hypertension Sister    Diabetes Paternal Grandmother    Heart attack Maternal Grandfather        age unknown; ? > 67   Stomach cancer Maternal Grandmother     Review of Systems  Constitutional:  Negative for chills and fever.  Respiratory:  Positive for shortness of breath. Negative for cough and wheezing.   Cardiovascular:  Negative for chest pain, palpitations and leg swelling.  Neurological:  Negative for light-headedness and headaches.      Objective:   Vitals:   12/27/20 1257  BP: 122/70  Pulse: 78  Temp: 98.6 F (37 C)  SpO2: 94%   BP Readings from Last 3 Encounters:  12/27/20 122/70  12/08/20 132/80  09/29/20 134/80   Wt Readings from Last 3 Encounters:  12/27/20 246 lb (111.6 kg)  12/08/20 248 lb 9.6 oz (112.8 kg)  09/29/20 240 lb (108.9 kg)   Body mass index is 40.94 kg/m.   Physical Exam    Constitutional: Appears well-developed and well-nourished. No distress.  HENT:  Head: Normocephalic and atraumatic.  Neck: Neck supple. No tracheal deviation present. No thyromegaly present.  No cervical lymphadenopathy Cardiovascular: Normal rate, regular rhythm and normal heart sounds.   No murmur heard. No carotid bruit .  No edema Pulmonary/Chest: Effort normal and breath sounds normal. No respiratory distress. No has no wheezes. No rales.  Skin: Skin is warm and dry. Not diaphoretic.  Psychiatric: Normal mood and affect. Behavior is normal.      Assessment & Plan:    See Problem List for Assessment and Plan of chronic medical problems.    This visit occurred during the SARS-CoV-2 public health emergency.  Safety protocols were in place, including screening questions prior to the visit, additional usage of staff PPE, and extensive cleaning of exam  room while observing appropriate contact time as indicated for disinfecting solutions.

## 2020-12-26 NOTE — Patient Instructions (Addendum)
    Blood work was ordered.      Medications changes include :   none     Please followup in 6 months  

## 2020-12-27 ENCOUNTER — Other Ambulatory Visit: Payer: Self-pay

## 2020-12-27 ENCOUNTER — Encounter: Payer: Self-pay | Admitting: Internal Medicine

## 2020-12-27 ENCOUNTER — Ambulatory Visit (INDEPENDENT_AMBULATORY_CARE_PROVIDER_SITE_OTHER): Payer: Medicare PPO | Admitting: Internal Medicine

## 2020-12-27 VITALS — BP 122/70 | HR 78 | Temp 98.6°F | Ht 65.0 in | Wt 246.0 lb

## 2020-12-27 DIAGNOSIS — F419 Anxiety disorder, unspecified: Secondary | ICD-10-CM | POA: Diagnosis not present

## 2020-12-27 DIAGNOSIS — N1831 Chronic kidney disease, stage 3a: Secondary | ICD-10-CM | POA: Diagnosis not present

## 2020-12-27 DIAGNOSIS — I10 Essential (primary) hypertension: Secondary | ICD-10-CM

## 2020-12-27 DIAGNOSIS — F32A Depression, unspecified: Secondary | ICD-10-CM | POA: Diagnosis not present

## 2020-12-27 DIAGNOSIS — J4520 Mild intermittent asthma, uncomplicated: Secondary | ICD-10-CM

## 2020-12-27 DIAGNOSIS — M25561 Pain in right knee: Secondary | ICD-10-CM | POA: Diagnosis not present

## 2020-12-27 DIAGNOSIS — M25562 Pain in left knee: Secondary | ICD-10-CM

## 2020-12-27 DIAGNOSIS — R7303 Prediabetes: Secondary | ICD-10-CM

## 2020-12-27 DIAGNOSIS — E7849 Other hyperlipidemia: Secondary | ICD-10-CM | POA: Diagnosis not present

## 2020-12-27 DIAGNOSIS — G8929 Other chronic pain: Secondary | ICD-10-CM

## 2020-12-27 LAB — CBC WITH DIFFERENTIAL/PLATELET
Basophils Absolute: 0.1 10*3/uL (ref 0.0–0.1)
Basophils Relative: 1 % (ref 0.0–3.0)
Eosinophils Absolute: 0.2 10*3/uL (ref 0.0–0.7)
Eosinophils Relative: 2.4 % (ref 0.0–5.0)
HCT: 34.3 % — ABNORMAL LOW (ref 36.0–46.0)
Hemoglobin: 11.2 g/dL — ABNORMAL LOW (ref 12.0–15.0)
Lymphocytes Relative: 15.1 % (ref 12.0–46.0)
Lymphs Abs: 1.4 10*3/uL (ref 0.7–4.0)
MCHC: 32.8 g/dL (ref 30.0–36.0)
MCV: 95.8 fl (ref 78.0–100.0)
Monocytes Absolute: 0.7 10*3/uL (ref 0.1–1.0)
Monocytes Relative: 7.8 % (ref 3.0–12.0)
Neutro Abs: 6.6 10*3/uL (ref 1.4–7.7)
Neutrophils Relative %: 73.7 % (ref 43.0–77.0)
Platelets: 285 10*3/uL (ref 150.0–400.0)
RBC: 3.58 Mil/uL — ABNORMAL LOW (ref 3.87–5.11)
RDW: 11.3 % — ABNORMAL LOW (ref 11.5–15.5)
WBC: 8.9 10*3/uL (ref 4.0–10.5)

## 2020-12-27 LAB — COMPREHENSIVE METABOLIC PANEL
ALT: 10 U/L (ref 0–35)
AST: 13 U/L (ref 0–37)
Albumin: 4 g/dL (ref 3.5–5.2)
Alkaline Phosphatase: 117 U/L (ref 39–117)
BUN: 22 mg/dL (ref 6–23)
CO2: 29 mEq/L (ref 19–32)
Calcium: 9.4 mg/dL (ref 8.4–10.5)
Chloride: 101 mEq/L (ref 96–112)
Creatinine, Ser: 1.14 mg/dL (ref 0.40–1.20)
GFR: 44.24 mL/min — ABNORMAL LOW (ref >60.00)
Glucose, Bld: 152 mg/dL — ABNORMAL HIGH (ref 70–99)
Potassium: 4.1 mEq/L (ref 3.5–5.1)
Sodium: 136 mEq/L (ref 135–145)
Total Bilirubin: 1.2 mg/dL (ref 0.2–1.2)
Total Protein: 7.5 g/dL (ref 6.0–8.3)

## 2020-12-27 LAB — LIPID PANEL
Cholesterol: 158 mg/dL (ref 0–200)
HDL: 30.3 mg/dL — ABNORMAL LOW (ref >39.00)
LDL Cholesterol: 106 mg/dL — ABNORMAL HIGH (ref 0–99)
NonHDL: 127.27
Total CHOL/HDL Ratio: 5
Triglycerides: 107 mg/dL (ref 0.0–149.0)
VLDL: 21.4 mg/dL (ref 0.0–40.0)

## 2020-12-27 LAB — HEMOGLOBIN A1C: Hgb A1c MFr Bld: 7.3 % — ABNORMAL HIGH (ref 4.6–6.5)

## 2020-12-27 NOTE — Assessment & Plan Note (Addendum)
Chronic Following with orthopedics Uses a cane to ambulate, but is limited in which she is able to do Takes advil 400 mg a few times a week sometimes Take Tylenol, can use Voltaren gel, avoid oral NSAIDs

## 2020-12-27 NOTE — Addendum Note (Signed)
Addended by: Jacobo Forest on: 12/27/2020 01:27 PM   Modules accepted: Orders

## 2020-12-27 NOTE — Assessment & Plan Note (Signed)
Chronic Check lipid panel  Continue pravastatin 20 mg daily Encouraged her to be as active as possible healthy diet encouraged

## 2020-12-27 NOTE — Assessment & Plan Note (Addendum)
Chronic Controlled, stable Continue sertraline 50 mg daily, lorazepam 0.5 mg daily as needed - has been taking it 2-3 times a week -- she has been taking it more than in the past

## 2020-12-27 NOTE — Assessment & Plan Note (Signed)
Chronic Mild, intermittent Continue albuterol inhaler as needed Overall asthma is controlled and she uses her inhaler infrequently

## 2020-12-27 NOTE — Assessment & Plan Note (Signed)
Chronic Check A1c 

## 2020-12-27 NOTE — Assessment & Plan Note (Signed)
Chronic Kidney function has been variable CMP today Discussed increasing fluids, avoiding NSAIDs Discussed importance of keeping blood pressure well controlled, sugars controlled

## 2020-12-27 NOTE — Assessment & Plan Note (Signed)
Chronic BP well controlled Continue amlodipine 5 mg daily, Lasix 40 mg daily, losartan 100 mg daily cmp

## 2020-12-29 ENCOUNTER — Ambulatory Visit: Payer: TRICARE For Life (TFL) | Admitting: Internal Medicine

## 2021-01-12 ENCOUNTER — Other Ambulatory Visit: Payer: Self-pay | Admitting: Internal Medicine

## 2021-01-12 DIAGNOSIS — Z1231 Encounter for screening mammogram for malignant neoplasm of breast: Secondary | ICD-10-CM

## 2021-01-17 ENCOUNTER — Telehealth: Payer: Self-pay | Admitting: Internal Medicine

## 2021-01-17 MED ORDER — ROSUVASTATIN CALCIUM 20 MG PO TABS: 20.0000 mg | ORAL_TABLET | Freq: Every day | ORAL | 3 refills | Status: DC

## 2021-01-17 NOTE — Telephone Encounter (Signed)
  Patient calling to request new order for Crestor be prescribed.   Pharmacy [facility] [place], [place]

## 2021-01-18 MED ORDER — ROSUVASTATIN CALCIUM 20 MG PO TABS: 10.0000 mg | ORAL_TABLET | Freq: Every day | ORAL | 3 refills | Status: DC

## 2021-01-18 NOTE — Telephone Encounter (Signed)
  Patient unsure about the dosage of medication, she thought the dosage was to be changed to lower dose  Patient calling to clarify dosage of rosuvastatin (CRESTOR) 20 MG tablet

## 2021-01-18 NOTE — Addendum Note (Signed)
Addended by: Binnie Rail on: 01/18/2021 11:06 AM   Modules accepted: Orders

## 2021-01-18 NOTE — Telephone Encounter (Signed)
Have her start with 1/2 pill daily and see how she does with it.  At her next visit we will check her blood work and re-evaluate.

## 2021-01-18 NOTE — Telephone Encounter (Signed)
Spoke with patient today and info given. 

## 2021-01-21 ENCOUNTER — Other Ambulatory Visit: Payer: Self-pay | Admitting: Internal Medicine

## 2021-01-23 MED ORDER — ROSUVASTATIN CALCIUM 10 MG PO TABS: 10.0000 mg | ORAL_TABLET | Freq: Every day | ORAL | 3 refills | Status: DC

## 2021-01-23 NOTE — Telephone Encounter (Signed)
Follow up message   Patient states pharmacy ford not recommend splitting Crestor in half   Please advise

## 2021-01-23 NOTE — Telephone Encounter (Signed)
Crestor 10 mg pills sent to [facility].

## 2021-01-23 NOTE — Addendum Note (Signed)
Addended by: Binnie Rail on: 01/23/2021 04:24 PM   Modules accepted: Orders

## 2021-01-24 NOTE — Telephone Encounter (Signed)
Spoke with patient today. 

## 2021-02-01 ENCOUNTER — Other Ambulatory Visit: Payer: Self-pay | Admitting: Internal Medicine

## 2021-02-23 ENCOUNTER — Ambulatory Visit
Admission: RE | Admit: 2021-02-23 | Discharge: 2021-02-23 | Disposition: A | Discharge status: Home / Self Care | Payer: Medicare PPO | Source: Ambulatory Visit | Attending: Internal Medicine | Admitting: Internal Medicine

## 2021-02-23 ENCOUNTER — Other Ambulatory Visit: Payer: Self-pay

## 2021-02-23 DIAGNOSIS — Z1231 Encounter for screening mammogram for malignant neoplasm of breast: Secondary | ICD-10-CM | POA: Diagnosis not present

## 2021-03-09 ENCOUNTER — Other Ambulatory Visit: Payer: Self-pay | Admitting: Internal Medicine

## 2021-04-02 ENCOUNTER — Other Ambulatory Visit: Payer: Self-pay | Admitting: Internal Medicine

## 2021-04-02 ENCOUNTER — Other Ambulatory Visit: Payer: Self-pay

## 2021-04-02 ENCOUNTER — Ambulatory Visit (INDEPENDENT_AMBULATORY_CARE_PROVIDER_SITE_OTHER): Payer: Medicare PPO

## 2021-04-02 DIAGNOSIS — Z23 Encounter for immunization: Secondary | ICD-10-CM | POA: Diagnosis not present

## 2021-04-22 ENCOUNTER — Other Ambulatory Visit: Payer: Self-pay | Admitting: Internal Medicine

## 2021-05-19 ENCOUNTER — Other Ambulatory Visit: Payer: Self-pay | Admitting: Internal Medicine

## 2021-06-27 ENCOUNTER — Encounter: Payer: Self-pay | Admitting: Internal Medicine

## 2021-06-27 NOTE — Patient Instructions (Addendum)
° °  Blood work was ordered.     Medications changes include :   none    A referral was ordered for neurology.      Someone from their office will call you to schedule an appointment.    Please followup in 6 months

## 2021-06-27 NOTE — Progress Notes (Signed)
Subjective:    Patient ID: [name], female    DOB: Mar 08, 1935, 86 y.o.   MRN: [id number]  This visit occurred during the SARS-CoV-2 public health emergency.  Safety protocols were in place, including screening questions prior to the visit, additional usage of staff PPE, and extensive cleaning of exam room while observing appropriate contact time as indicated for disinfecting solutions.     HPI The patient is here for follow up of their chronic medical problems, including htn, hld, prediabetes, asthma, anxiety, depression, chronic b/l knee OA, CKD  Takes advil a few times a week.  Left head pain - intermittent - pulling sensation.  It comes occasionally.  It last a few seconds.  It is not like a headache.    Medications and allergies reviewed with patient and updated if appropriate.  Patient Active Problem List   Diagnosis Date Noted   Type 2 diabetes mellitus with stage 3a chronic kidney disease, without long-term current use of insulin ([place]) 06/28/2021   Morbid obesity ([place]) 06/28/2021   Right ear pain 12/08/2020   Aortic atherosclerosis ([place]) 09/29/2020   Head pain 09/29/2020   Left knee pain 08/30/2020   CKD (chronic kidney disease) stage 3, GFR 30-59 ml/min ([place]) 08/30/2020   Lightheadedness 09/22/2019   Pruritus 04/08/2018   Chronic fatigue 08/07/2016   DOE (dyspnea on exertion) 08/07/2016   Chronic knee pain, bilateral 12/29/2015   Anxiety and depression 10/13/2015   NONSPECIFIC ABNORMAL ELECTROCARDIOGRAM 08/25/2009   Low back pain 11/20/2007   Hyperlipidemia 06/23/2007   Essential hypertension 06/23/2007   Anemia 05/27/2007   ALLERGIC RHINITIS 05/27/2007   Asthma 05/27/2007   CAROTID BRUIT, LEFT 76/73/4193   METABOLIC SYNDROME X 79/08/4095    Current Outpatient Medications on File Prior to Visit  Medication Sig Dispense Refill   Alum Hydroxide-Mag Carbonate (GAVISCON PO) Take 1 Dose by mouth as directed.     aspirin 81 MG tablet Take 81 mg by mouth  daily.     Aspirin-Acetaminophen-Caffeine (EXCEDRIN PO) Take 1 Dose by mouth as directed.     Cholecalciferol (VITAMIN D3) 50 MCG (2000 UT) capsule Take 1 capsule (2,000 Units total) by mouth daily. 100 capsule 3   fluticasone (FLONASE) 50 MCG/ACT nasal spray SHAKE LIQUID AND USE 2 SPRAYS IN EACH NOSTRIL DAILY 48 g 1   loratadine (CLARITIN) 10 MG tablet Take 1 tablet (10 mg total) by mouth daily. 30 tablet 11   rosuvastatin (CRESTOR) 10 MG tablet Take 1 tablet (10 mg total) by mouth daily. 90 tablet 3   No current facility-administered medications on file prior to visit.    Past Medical History:  Diagnosis Date   Allergy    Anemia    Asthma    adult onset   Carotid bruit    left   Hyperlipidemia    LDL goal = < 120 based on NMR Lipoprofile 2005   Hypertension    Metabolic syndrome     Past Surgical History:  Procedure Laterality Date   CARDIAC CATHETERIZATION  2006   negative   CATARACT EXTRACTION, BILATERAL     Dr Bing Plume   COLONOSCOPY  06/02/2006   Dr.Kaplan,Next Due date 06/02/2016   SHOULDER SURGERY  11/21/2008   Dr.Aplington   TOTAL ABDOMINAL HYSTERECTOMY W/ BILATERAL SALPINGOOPHORECTOMY  1983   Dr.Fore, for fibroids with pain   TOTAL HIP ARTHROPLASTY  2005, 2006    Social History   Socioeconomic History   Marital status: Widowed    Spouse  name: Not on file   Number of children: Not on file   Years of education: Not on file   Highest education level: Not on file  Occupational History   Not on file  Tobacco Use   Smoking status: Never   Smokeless tobacco: Never  Substance and Sexual Activity   Alcohol use: No   Drug use: No   Sexual activity: Not on file  Other Topics Concern   Not on file  Social History Narrative   Not on file   Social Determinants of Health   Financial Resource Strain: Not on file  Food Insecurity: Not on file  Transportation Needs: Not on file  Physical Activity: Not on file  Stress: Not on file  Social Connections: Not on file     Family History  Problem Relation Age of Onset   Hypertension Father    Stroke Father        > 65   Hypertension Mother    Arthritis Mother    Hypertension Sister    Diabetes Paternal Grandmother    Heart attack Maternal Grandfather        age unknown; ? > 57   Stomach cancer Maternal Grandmother     Review of Systems  Constitutional:  Negative for chills and fever.  Respiratory:  Positive for shortness of breath. Negative for cough and wheezing.   Cardiovascular:  Positive for leg swelling (controlled). Negative for chest pain and palpitations.  Neurological:  Positive for headaches (occ). Negative for light-headedness.      Objective:   Vitals:   06/28/21 1115  BP: 118/60  Pulse: (!) 51  Temp: 98.1 F (36.7 C)  SpO2: 94%   BP Readings from Last 3 Encounters:  06/28/21 118/60  12/27/20 122/70  12/08/20 132/80   Wt Readings from Last 3 Encounters:  06/28/21 245 lb 9.6 oz (111.4 kg)  12/27/20 246 lb (111.6 kg)  12/08/20 248 lb 9.6 oz (112.8 kg)   Body mass index is 40.87 kg/m.   Physical Exam    Constitutional: Appears well-developed and well-nourished. No distress.  HENT:  Head: Normocephalic and atraumatic.  Neck: Neck supple. No tracheal deviation present. No thyromegaly present.  No cervical lymphadenopathy Cardiovascular: Normal rate, regular rhythm and normal heart sounds.   No murmur heard. No carotid bruit .  No edema Pulmonary/Chest: Effort normal and breath sounds normal. No respiratory distress. No has no wheezes. No rales.  Skin: Skin is warm and dry. Not diaphoretic.  Psychiatric: Normal mood and affect. Behavior is normal.      Assessment & Plan:    See Problem List for Assessment and Plan of chronic medical problems.

## 2021-06-28 ENCOUNTER — Ambulatory Visit (INDEPENDENT_AMBULATORY_CARE_PROVIDER_SITE_OTHER): Payer: Medicare PPO | Admitting: Internal Medicine

## 2021-06-28 ENCOUNTER — Other Ambulatory Visit: Payer: Self-pay

## 2021-06-28 VITALS — BP 118/60 | HR 51 | Temp 98.1°F | Ht 65.0 in | Wt 245.6 lb

## 2021-06-28 DIAGNOSIS — F419 Anxiety disorder, unspecified: Secondary | ICD-10-CM | POA: Diagnosis not present

## 2021-06-28 DIAGNOSIS — R7303 Prediabetes: Secondary | ICD-10-CM

## 2021-06-28 DIAGNOSIS — I10 Essential (primary) hypertension: Secondary | ICD-10-CM

## 2021-06-28 DIAGNOSIS — J4520 Mild intermittent asthma, uncomplicated: Secondary | ICD-10-CM | POA: Diagnosis not present

## 2021-06-28 DIAGNOSIS — N1831 Chronic kidney disease, stage 3a: Secondary | ICD-10-CM | POA: Diagnosis not present

## 2021-06-28 DIAGNOSIS — E1122 Type 2 diabetes mellitus with diabetic chronic kidney disease: Secondary | ICD-10-CM | POA: Diagnosis not present

## 2021-06-28 DIAGNOSIS — M25561 Pain in right knee: Secondary | ICD-10-CM

## 2021-06-28 DIAGNOSIS — R519 Headache, unspecified: Secondary | ICD-10-CM

## 2021-06-28 DIAGNOSIS — G8929 Other chronic pain: Secondary | ICD-10-CM

## 2021-06-28 DIAGNOSIS — E7849 Other hyperlipidemia: Secondary | ICD-10-CM | POA: Diagnosis not present

## 2021-06-28 DIAGNOSIS — M25562 Pain in left knee: Secondary | ICD-10-CM

## 2021-06-28 DIAGNOSIS — I700 Atherosclerosis of aorta: Secondary | ICD-10-CM | POA: Diagnosis not present

## 2021-06-28 DIAGNOSIS — F32A Depression, unspecified: Secondary | ICD-10-CM

## 2021-06-28 MED ORDER — LOSARTAN POTASSIUM 100 MG PO TABS: 100.0000 mg | ORAL_TABLET | Freq: Every day | ORAL | 3 refills | Status: DC

## 2021-06-28 MED ORDER — SERTRALINE HCL 50 MG PO TABS: ORAL_TABLET | ORAL | 1 refills | Status: DC

## 2021-06-28 MED ORDER — AMLODIPINE BESYLATE 5 MG PO TABS: ORAL_TABLET | ORAL | 1 refills | Status: DC

## 2021-06-28 MED ORDER — FUROSEMIDE 40 MG PO TABS: ORAL_TABLET | ORAL | 1 refills | Status: DC

## 2021-06-28 MED ORDER — ALBUTEROL SULFATE HFA 108 (90 BASE) MCG/ACT IN AERS: INHALATION_SPRAY | RESPIRATORY_TRACT | 5 refills | Status: DC

## 2021-06-28 MED ORDER — LORAZEPAM 0.5 MG PO TABS: ORAL_TABLET | ORAL | 2 refills | Status: DC

## 2021-06-28 NOTE — Assessment & Plan Note (Signed)
Chronic No longer takes ibuprofen, rarely takes excedrin BP well controlled

## 2021-06-28 NOTE — Assessment & Plan Note (Signed)
Chronic Has intermittent left sided head pain - pulling sensation that lasts a few seconds Likely nerve sensation in scalp Referred to neuro

## 2021-06-28 NOTE — Assessment & Plan Note (Signed)
Chronic She is sedentary due to chronic knee pain Encouraged weight loss

## 2021-06-28 NOTE — Assessment & Plan Note (Signed)
Chronic Controlled, stable Continue sertraline 50 mg daily, ativan 0.5 mg daily prn

## 2021-06-28 NOTE — Assessment & Plan Note (Signed)
Chronic BP well controlled Continue losartan 100 mg daily, lasix 40 mg daily, amlodipine 5 mg daily cmp

## 2021-06-28 NOTE — Assessment & Plan Note (Signed)
Chronic Controlled, stable Continue albuterol inhaler prn

## 2021-06-28 NOTE — Assessment & Plan Note (Signed)
Chronic Follows with orthopedics Does not want surgery Uses a cane No longer taking advil Uses voltaren and takes tylenol

## 2021-06-28 NOTE — Assessment & Plan Note (Signed)
Chronic Continue crestor 10 mg daily 

## 2021-06-28 NOTE — Assessment & Plan Note (Signed)
Chronic Lab Results  Component Value Date   HGBA1C 7.3 (H) 12/27/2020   Not ideally controlled with diet a1c today May need medication depending on a1c

## 2021-06-28 NOTE — Assessment & Plan Note (Signed)
Chronic Check lipid panel  Continue crestor 10 mg daily Regular exercise and healthy diet encouraged  

## 2021-07-02 ENCOUNTER — Encounter: Payer: Self-pay | Admitting: Neurology

## 2021-07-04 ENCOUNTER — Other Ambulatory Visit: Payer: Self-pay

## 2021-07-04 ENCOUNTER — Other Ambulatory Visit (INDEPENDENT_AMBULATORY_CARE_PROVIDER_SITE_OTHER): Payer: Medicare PPO

## 2021-07-04 DIAGNOSIS — E7849 Other hyperlipidemia: Secondary | ICD-10-CM

## 2021-07-04 DIAGNOSIS — I10 Essential (primary) hypertension: Secondary | ICD-10-CM | POA: Diagnosis not present

## 2021-07-04 DIAGNOSIS — N1831 Chronic kidney disease, stage 3a: Secondary | ICD-10-CM | POA: Diagnosis not present

## 2021-07-04 LAB — COMPREHENSIVE METABOLIC PANEL
ALT: 9 U/L (ref 0–35)
AST: 13 U/L (ref 0–37)
Albumin: 3.9 g/dL (ref 3.5–5.2)
Alkaline Phosphatase: 125 U/L — ABNORMAL HIGH (ref 39–117)
BUN: 26 mg/dL — ABNORMAL HIGH (ref 6–23)
CO2: 29 mEq/L (ref 19–32)
Calcium: 9.5 mg/dL (ref 8.4–10.5)
Chloride: 103 mEq/L (ref 96–112)
Creatinine, Ser: 1.23 mg/dL — ABNORMAL HIGH (ref 0.40–1.20)
GFR: 40.23 mL/min — ABNORMAL LOW (ref >60.00)
Glucose, Bld: 188 mg/dL — ABNORMAL HIGH (ref 70–99)
Potassium: 3.8 mEq/L (ref 3.5–5.1)
Sodium: 138 mEq/L (ref 135–145)
Total Bilirubin: 1.1 mg/dL (ref 0.2–1.2)
Total Protein: 7.4 g/dL (ref 6.0–8.3)

## 2021-07-04 LAB — CBC WITH DIFFERENTIAL/PLATELET
Basophils Absolute: 0.1 10*3/uL (ref 0.0–0.1)
Basophils Relative: 0.8 % (ref 0.0–3.0)
Eosinophils Absolute: 0.2 10*3/uL (ref 0.0–0.7)
Eosinophils Relative: 2.5 % (ref 0.0–5.0)
HCT: 34.3 % — ABNORMAL LOW (ref 36.0–46.0)
Hemoglobin: 11.1 g/dL — ABNORMAL LOW (ref 12.0–15.0)
Lymphocytes Relative: 17.1 % (ref 12.0–46.0)
Lymphs Abs: 1.5 10*3/uL (ref 0.7–4.0)
MCHC: 32.4 g/dL (ref 30.0–36.0)
MCV: 94.3 fl (ref 78.0–100.0)
Monocytes Absolute: 0.7 10*3/uL (ref 0.1–1.0)
Monocytes Relative: 7.4 % (ref 3.0–12.0)
Neutro Abs: 6.5 10*3/uL (ref 1.4–7.7)
Neutrophils Relative %: 72.2 % (ref 43.0–77.0)
Platelets: 257 10*3/uL (ref 150.0–400.0)
RBC: 3.64 Mil/uL — ABNORMAL LOW (ref 3.87–5.11)
RDW: 11.9 % (ref 11.5–15.5)
WBC: 9 10*3/uL (ref 4.0–10.5)

## 2021-07-04 LAB — LIPID PANEL
Cholesterol: 116 mg/dL (ref 0–200)
HDL: 30.2 mg/dL — ABNORMAL LOW (ref >39.00)
LDL Cholesterol: 72 mg/dL (ref 0–99)
NonHDL: 85.86
Total CHOL/HDL Ratio: 4
Triglycerides: 68 mg/dL (ref 0.0–149.0)
VLDL: 13.6 mg/dL (ref 0.0–40.0)

## 2021-07-04 LAB — HEMOGLOBIN A1C: Hgb A1c MFr Bld: 7.5 % — ABNORMAL HIGH (ref 4.6–6.5)

## 2021-07-31 ENCOUNTER — Encounter: Payer: Self-pay | Admitting: Internal Medicine

## 2021-07-31 NOTE — Progress Notes (Signed)
Virtual Visit via telephone Note  I connected with [place] on 07/31/21 at  2:20 PM EST by telephone and verified that I am speaking with the correct person using two identifiers.   I discussed the limitations of evaluation and management by telemedicine and the availability of in person appointments. The patient expressed understanding and agreed to proceed.  Present for the visit:  Myself, Dr Billey Gosling, Shirlee More.  The patient is currently at home and I am in the office.    No referring provider.    History of Present Illness: This is an acute visit for diabetes / discussion of medication and discuss her chronic kidney disease.   Reviewed most recent blood work.  She has several questions regarding her blood work, which she can do to improve her kidney function.     Social History   Socioeconomic History   Marital status: Widowed    Spouse name: Not on file   Number of children: Not on file   Years of education: Not on file   Highest education level: Not on file  Occupational History   Not on file  Tobacco Use   Smoking status: Never   Smokeless tobacco: Never  Substance and Sexual Activity   Alcohol use: No   Drug use: No   Sexual activity: Not on file  Other Topics Concern   Not on file  Social History Narrative   Not on file   Social Determinants of Health   Financial Resource Strain: Not on file  Food Insecurity: Not on file  Transportation Needs: Not on file  Physical Activity: Not on file  Stress: Not on file  Social Connections: Not on file     Observations/Objective:   Lab Results  Component Value Date   WBC 9.0 07/04/2021   HGB 11.1 (L) 07/04/2021   HCT 34.3 (L) 07/04/2021   PLT 257.0 07/04/2021   GLUCOSE 188 (H) 07/04/2021   CHOL 116 07/04/2021   TRIG 68.0 07/04/2021   HDL 30.20 (L) 07/04/2021   LDLCALC 72 07/04/2021   ALT 9 07/04/2021   AST 13 07/04/2021   NA 138 07/04/2021   K 3.8 07/04/2021   CL 103 07/04/2021   CREATININE 1.23  (H) 07/04/2021   BUN 26 (H) 07/04/2021   CO2 29 07/04/2021   TSH 2.22 01/12/2020   HGBA1C 7.5 (H) 07/04/2021    Assessment and Plan:  See Problem List for Assessment and Plan of chronic medical problems.   Follow Up Instructions:    I discussed the assessment and treatment plan with the patient. The patient was provided an opportunity to ask questions and all were answered. The patient agreed with the plan and demonstrated an understanding of the instructions.   The patient was advised to call back or seek an in-person evaluation if the symptoms worsen or if the condition fails to improve as anticipated.  Time spent telephone call: 13 minutes  Binnie Rail, MD

## 2021-08-01 ENCOUNTER — Telehealth (INDEPENDENT_AMBULATORY_CARE_PROVIDER_SITE_OTHER): Payer: Medicare PPO | Admitting: Internal Medicine

## 2021-08-01 ENCOUNTER — Other Ambulatory Visit: Payer: Self-pay

## 2021-08-01 DIAGNOSIS — E1122 Type 2 diabetes mellitus with diabetic chronic kidney disease: Secondary | ICD-10-CM | POA: Diagnosis not present

## 2021-08-01 DIAGNOSIS — N1831 Chronic kidney disease, stage 3a: Secondary | ICD-10-CM | POA: Diagnosis not present

## 2021-08-01 MED ORDER — RYBELSUS 3 MG PO TABS: 3.0000 mg | ORAL_TABLET | Freq: Every day | ORAL | 3 refills | Status: DC

## 2021-08-01 NOTE — Assessment & Plan Note (Signed)
Chronic Reviewed most recent blood work GFR has varied in the past, but the past few readings have been consistently low-stage IIIa CKD Discussed increasing fluid intake-water and avoiding NSAIDs, which she has been doing since her last visit Discussed the importance of getting her sugars better controlled, keeping blood pressure well controlled which it has been Discussed benefits of weight loss Will recheck GFR in March prior to her next appointment Discussed that we can consider referral to nephrology

## 2021-08-01 NOTE — Assessment & Plan Note (Signed)
Chronic Reviewed latest blood work-A1c 7.5%, which is an increase The past couple of A1c readings have been higher Stressed adjusting her diet-low sugar/carbohydrate focus diet.  She is limited in her exercise because of arthritis, but encouraged as much exercise as possible Discussed the benefits of weight loss for her sugars Advised considering Rybelsus 3 mg daily.  Discussed that she needs to take this 30 minutes before breakfast with a small amount of water.  Discussed possible side effects.  Discussed possible benefits with helping with weight loss She agrees to try it if covered by her insurance-prescription sent to pharmacy Will move up follow-up to March with blood work prior

## 2021-08-03 ENCOUNTER — Other Ambulatory Visit: Payer: Self-pay

## 2021-08-03 ENCOUNTER — Encounter: Payer: Self-pay | Admitting: Internal Medicine

## 2021-08-03 ENCOUNTER — Ambulatory Visit (INDEPENDENT_AMBULATORY_CARE_PROVIDER_SITE_OTHER): Payer: Medicare PPO | Admitting: Internal Medicine

## 2021-08-03 DIAGNOSIS — U071 COVID-19: Secondary | ICD-10-CM | POA: Insufficient documentation

## 2021-08-03 MED ORDER — MOLNUPIRAVIR 200 MG PO CAPS: 4.0000 | ORAL_CAPSULE | Freq: Two times a day (BID) | ORAL | 0 refills | Status: AC

## 2021-08-03 MED ORDER — HYDROCODONE BIT-HOMATROP MBR 5-1.5 MG/5ML PO SOLN: 5.0000 mL | Freq: Three times a day (TID) | ORAL | 0 refills | Status: DC | PRN

## 2021-08-03 NOTE — Patient Instructions (Signed)
° ° °  You tested positive for COVID.   Medications changes include :   antiviral x 5 days and cough syrup at night   Your prescription(s) have been submitted to your pharmacy. Please take as directed and contact our office if you believe you are having problem(s) with the medication(s).   Call with any questions or concerns.

## 2021-08-03 NOTE — Progress Notes (Signed)
Subjective:    Patient ID: [name], female    DOB: [birth year], 87 y.o.   MRN: [id number]  This visit occurred during the SARS-CoV-2 public health emergency.  Safety protocols were in place, including screening questions prior to the visit, additional usage of staff PPE, and extensive cleaning of exam room while observing appropriate contact time as indicated for disinfecting solutions.    HPI She is here for an acute visit for cold symptoms.   Her symptoms started 2 days ago.  Today is day 3 of her symptoms.    She is experiencing F/C, fatigued, nasal congestion, runny nose, sore throat, cough, nausea, HA, dizziness and body aches  She has tried taking  Excedrin, tylenol, robitussin, theraflu   COVID test at home yesterday was negative.  She was retested here today and it is positive.  Medications and allergies reviewed with patient and updated if appropriate.  Patient Active Problem List   Diagnosis Date Noted   Type 2 diabetes mellitus with stage 3a chronic kidney disease, without long-term current use of insulin ([place]) 06/28/2021   Morbid obesity ([place]) 06/28/2021   Right ear pain 12/08/2020   Aortic atherosclerosis ([place]) 09/29/2020   Head pain 09/29/2020   Left knee pain 08/30/2020   CKD (chronic kidney disease) stage 3, GFR 30-59 ml/min ([place]) 08/30/2020   Lightheadedness 09/22/2019   Pruritus 04/08/2018   Chronic fatigue 08/07/2016   DOE (dyspnea on exertion) 08/07/2016   Chronic knee pain, bilateral 12/29/2015   Anxiety and depression 10/13/2015   NONSPECIFIC ABNORMAL ELECTROCARDIOGRAM 08/25/2009   Low back pain 11/20/2007   Hyperlipidemia 06/23/2007   Essential hypertension 06/23/2007   Anemia 05/27/2007   ALLERGIC RHINITIS 05/27/2007   Asthma 05/27/2007   CAROTID BRUIT, LEFT 64/33/2951   METABOLIC SYNDROME X 88/41/6606    Current Outpatient Medications on File Prior to Visit  Medication Sig Dispense Refill   albuterol (VENTOLIN HFA) 108 (90 Base)  MCG/ACT inhaler INHALE 2 PUFFS BY MOUTH EVERY 6 HOURS AS NEEDED FOR WHEEZING OR SHORTNESS OF BREATH 18 g 5   Alum Hydroxide-Mag Carbonate (GAVISCON PO) Take 1 Dose by mouth as directed.     amLODipine (NORVASC) 5 MG tablet TAKE 1 TABLET(5 MG) BY MOUTH DAILY 90 tablet 1   aspirin 81 MG tablet Take 81 mg by mouth daily.     Aspirin-Acetaminophen-Caffeine (EXCEDRIN PO) Take 1 Dose by mouth as directed.     Cholecalciferol (VITAMIN D3) 50 MCG (2000 UT) capsule Take 1 capsule (2,000 Units total) by mouth daily. 100 capsule 3   fluticasone (FLONASE) 50 MCG/ACT nasal spray SHAKE LIQUID AND USE 2 SPRAYS IN EACH NOSTRIL DAILY 48 g 1   furosemide (LASIX) 40 MG tablet TAKE 1 TABLET(40 MG) BY MOUTH DAILY 90 tablet 1   loratadine (CLARITIN) 10 MG tablet Take 1 tablet (10 mg total) by mouth daily. 30 tablet 11   LORazepam (ATIVAN) 0.5 MG tablet TAKE 1 TABLET(0.5 MG) BY MOUTH DAILY AS NEEDED FOR ANXIETY 30 tablet 2   losartan (COZAAR) 100 MG tablet Take 1 tablet (100 mg total) by mouth daily. 90 tablet 3   rosuvastatin (CRESTOR) 10 MG tablet Take 1 tablet (10 mg total) by mouth daily. 90 tablet 3   Semaglutide (RYBELSUS) 3 MG TABS Take 3 mg by mouth daily before breakfast. Take 30 minutes prior to breakfast 30 tablet 3   sertraline (ZOLOFT) 50 MG tablet TAKE 1 TABLET(50 MG) BY MOUTH DAILY 90 tablet 1   No current facility-administered  medications on file prior to visit.    Past Medical History:  Diagnosis Date   Allergy    Anemia    Asthma    adult onset   Carotid bruit    left   Hyperlipidemia    LDL goal = < 120 based on NMR Lipoprofile 2005   Hypertension    Metabolic syndrome     Past Surgical History:  Procedure Laterality Date   CARDIAC CATHETERIZATION  2006   negative   CATARACT EXTRACTION, BILATERAL     Dr Bing Plume   COLONOSCOPY  06/02/2006   Dr.Kaplan,Next Due date 06/02/2016   SHOULDER SURGERY  11/21/2008   Dr.Aplington   TOTAL ABDOMINAL HYSTERECTOMY W/ BILATERAL SALPINGOOPHORECTOMY   1983   Dr.Fore, for fibroids with pain   TOTAL HIP ARTHROPLASTY  2005, 2006    Social History   Socioeconomic History   Marital status: Widowed    Spouse name: Not on file   Number of children: Not on file   Years of education: Not on file   Highest education level: Not on file  Occupational History   Not on file  Tobacco Use   Smoking status: Never   Smokeless tobacco: Never  Substance and Sexual Activity   Alcohol use: No   Drug use: No   Sexual activity: Not on file  Other Topics Concern   Not on file  Social History Narrative   Not on file   Social Determinants of Health   Financial Resource Strain: Not on file  Food Insecurity: Not on file  Transportation Needs: Not on file  Physical Activity: Not on file  Stress: Not on file  Social Connections: Not on file    Family History  Problem Relation Age of Onset   Hypertension Father    Stroke Father        > 30   Hypertension Mother    Arthritis Mother    Hypertension Sister    Diabetes Paternal Grandmother    Heart attack Maternal Grandfather        age unknown; ? > 37   Stomach cancer Maternal Grandmother     Review of Systems  Constitutional:  Positive for chills, fatigue and fever.  HENT:  Positive for congestion, rhinorrhea and sore throat. Negative for ear pain and sinus pain.        No change in taste or smell  Respiratory:  Positive for cough. Negative for shortness of breath and wheezing.   Cardiovascular:  Negative for chest pain.  Gastrointestinal:  Positive for nausea.  Musculoskeletal:  Positive for myalgias.  Neurological:  Positive for dizziness and headaches.      Objective:   Vitals:   08/03/21 1011  BP: (!) 142/68  Pulse: 87  Temp: 99.1 F (37.3 C)  SpO2: 96%   BP Readings from Last 3 Encounters:  08/03/21 (!) 142/68  06/28/21 118/60  12/27/20 122/70   Wt Readings from Last 3 Encounters:  08/03/21 245 lb 3.2 oz (111.2 kg)  06/28/21 245 lb 9.6 oz (111.4 kg)  12/27/20 246  lb (111.6 kg)   Body mass index is 40.8 kg/m.   Physical Exam    GENERAL APPEARANCE: Appears stated age, well appearing, NAD EYES: conjunctiva clear, no icterus HENT: bilateral tympanic membranes and ear canals normal, oropharynx with no erythema or exudates, trachea midline, no cervical or supraclavicular lymphadenopathy LUNGS: Unlabored breathing, good air entry bilaterally, clear to auscultation without wheeze or crackles CARDIOVASCULAR: Normal S1,S2 , no edema SKIN: Warm,  dry      Assessment & Plan:    See Problem List for Assessment and Plan of chronic medical problems.

## 2021-08-03 NOTE — Assessment & Plan Note (Signed)
Acute Today is day 3 of symptoms Home COVID test negative yesterday, but COVID test here today is positive Symptoms consistent with COVID Symptoms mild-moderate in nature She is high risk for complications She agrees to antiviral-start molnupiravir 800 mg twice daily x5 days.  Discussed possible side effects Will prescribe Hycodan cough syrup 5 mils every 8 hours as needed Continue OTC cold medications-Mucinex, Coricidin Can take Tylenol She will call with any concerns or questions Discussed quarantine recommendations

## 2021-09-25 NOTE — Progress Notes (Signed)
? ?NEUROLOGY CONSULTATION NOTE ? ?[name] ?MRN: [id number] ?DOB: Feb 18, 1935 ? ?Referring provider: Billey Gosling, MD ?Primary care provider: Billey Gosling, MD ? ?Reason for consult:  headache ? ?Assessment/Plan:  ? ?Paroxysmal headache unspecified.  Does not fall into any particular headache syndrome.  Given the stability and mild severity over the past year, unlikely to be secondary to a serious secondary intracranial etiology such as tumor or aneurysm.  Not consistent with GCA.  Therefore, I don't think further workup is warranted.  They are brief and not too painful, so she defers medication.  Follow up a needed. ? ? ?Subjective:  ?[name] is an 87 year old female with HTN, HLD, asthma and anemia who presents for headache.  History supplemented by referring provider's note. ? ?About a year ago, she began experiencing a new headache.  She reports a 3/10 gradual onset pressure-like/pulling sensation that starts from her temple and radiates up across the forehead to her vertex.  It may occur on either side but usually left sided.  It lasts for just a few seconds.  It occurs approximately 10 times a month.  It is not triggered by any particular activity.  No stabbing pain, visual disturbance, speech disturbance, numbness or weakness. ? ? ?Prior CT head from 04/23/2013, personally reviewed, which revealed no acute intracranial abnormality. ? ?Current NSAIDS/analgesics:  ASA '81mg'$  daily, Excedrin Migraine ?Current triptans:  none ?Current ergotamine:  none ?Current anti-emetic:  none ?Current muscle relaxants:  none ?Current Antihypertensive medications:  amlodipine, losartan, furomsemide ?Current Antidepressant medications:  sertraline '50mg'$  ?Current Anticonvulsant medications:  none ?Current anti-CGRP:  none ?Current Vitamins/Herbal/Supplements:  none ?Current Antihistamines/Decongestants:  Claritin, Flonase ?Other therapy:  none ?Hormone/birth control:  none ? ? ?  ? ? ?PAST MEDICAL HISTORY: ?Past Medical History:   ?Diagnosis Date  ? Allergy   ? Anemia   ? Asthma   ? adult onset  ? Carotid bruit   ? left  ? Hyperlipidemia   ? LDL goal = < 120 based on NMR Lipoprofile 2005  ? Hypertension   ? Metabolic syndrome   ? ? ?PAST SURGICAL HISTORY: ?Past Surgical History:  ?Procedure Laterality Date  ? CARDIAC CATHETERIZATION  2006  ? negative  ? CATARACT EXTRACTION, BILATERAL    ? Dr Bing Plume  ? COLONOSCOPY  06/02/2006  ? Dr.Kaplan,Next Due date 06/02/2016  ? SHOULDER SURGERY  11/21/2008  ? Dr.Aplington  ? TOTAL ABDOMINAL HYSTERECTOMY W/ BILATERAL SALPINGOOPHORECTOMY  1983  ? Dr.Fore, for fibroids with pain  ? TOTAL HIP ARTHROPLASTY  2005, 2006  ? ? ?MEDICATIONS: ?Current Outpatient Medications on File Prior to Visit  ?Medication Sig Dispense Refill  ? albuterol (VENTOLIN HFA) 108 (90 Base) MCG/ACT inhaler INHALE 2 PUFFS BY MOUTH EVERY 6 HOURS AS NEEDED FOR WHEEZING OR SHORTNESS OF BREATH 18 g 5  ? Alum Hydroxide-Mag Carbonate (GAVISCON PO) Take 1 Dose by mouth as directed.    ? amLODipine (NORVASC) 5 MG tablet TAKE 1 TABLET(5 MG) BY MOUTH DAILY 90 tablet 1  ? aspirin 81 MG tablet Take 81 mg by mouth daily.    ? Aspirin-Acetaminophen-Caffeine (EXCEDRIN PO) Take 1 Dose by mouth as directed.    ? Cholecalciferol (VITAMIN D3) 50 MCG (2000 UT) capsule Take 1 capsule (2,000 Units total) by mouth daily. 100 capsule 3  ? fluticasone (FLONASE) 50 MCG/ACT nasal spray SHAKE LIQUID AND USE 2 SPRAYS IN EACH NOSTRIL DAILY 48 g 1  ? furosemide (LASIX) 40 MG tablet TAKE 1 TABLET(40 MG) BY MOUTH  DAILY 90 tablet 1  ? HYDROcodone bit-homatropine (HYCODAN) 5-1.5 MG/5ML syrup Take 5 mLs by mouth every 8 (eight) hours as needed for cough. 120 mL 0  ? loratadine (CLARITIN) 10 MG tablet Take 1 tablet (10 mg total) by mouth daily. 30 tablet 11  ? LORazepam (ATIVAN) 0.5 MG tablet TAKE 1 TABLET(0.5 MG) BY MOUTH DAILY AS NEEDED FOR ANXIETY 30 tablet 2  ? losartan (COZAAR) 100 MG tablet Take 1 tablet (100 mg total) by mouth daily. 90 tablet 3  ? rosuvastatin  (CRESTOR) 10 MG tablet Take 1 tablet (10 mg total) by mouth daily. 90 tablet 3  ? Semaglutide (RYBELSUS) 3 MG TABS Take 3 mg by mouth daily before breakfast. Take 30 minutes prior to breakfast 30 tablet 3  ? sertraline (ZOLOFT) 50 MG tablet TAKE 1 TABLET(50 MG) BY MOUTH DAILY 90 tablet 1  ? ?No current facility-administered medications on file prior to visit.  ? ? ?ALLERGIES: ?Allergies  ?Allergen Reactions  ? Latex Hives  ? ? ?FAMILY HISTORY: ?Family History  ?Problem Relation Age of Onset  ? Hypertension Father   ? Stroke Father   ?     > 37  ? Hypertension Mother   ? Arthritis Mother   ? Hypertension Sister   ? Diabetes Paternal Grandmother   ? Heart attack Maternal Grandfather   ?     age unknown; ? > 13  ? Stomach cancer Maternal Grandmother   ? ? ?Objective:  ?Blood pressure (!) 110/58, pulse 99, height '5\' 7"'$  (1.702 m), weight 236 lb 3.2 oz (107.1 kg), SpO2 95 %. ?General: No acute distress.  Patient appears well-groomed.   ?Head:  Normocephalic/atraumatic ?Eyes:  fundi examined but not visualized ?Neck: supple, no paraspinal tenderness, full range of motion ?Back: No paraspinal tenderness ?Heart: regular rate and rhythm ?Lungs: Clear to auscultation bilaterally. ?Vascular: No carotid bruits. ?Neurological Exam: ?Mental status: alert and oriented to person, place, and time, recent and remote memory intact, fund of knowledge intact, attention and concentration intact, speech fluent and not dysarthric, language intact. ?Cranial nerves: ?CN I: not tested ?CN II: pupils equal, round and reactive to light, visual fields intact ?CN III, IV, VI:  full range of motion, no nystagmus, no ptosis ?CN V: facial sensation intact. ?CN VII: upper and lower face symmetric ?CN VIII: hearing intact ?CN IX, X: gag intact, uvula midline ?CN XI: sternocleidomastoid and trapezius muscles intact ?CN XII: tongue midline ?Bulk & Tone: normal, no fasciculations. ?Motor:  muscle strength 5/5 throughout ?Sensation:  temperature and  vibratory sensation intact. ?Deep Tendon Reflexes:  2+ throughout,  toes downgoing.   ?Finger to nose testing:  Without dysmetria.   ?Heel to shin:  Without dysmetria.   ?Gait:  Broad-based.  Romberg negative. ? ? ? ?Thank you for allowing me to take part in the care of this patient. ? ?Metta Clines, DO ? ?CC: Billey Gosling, MD ? ? ? ? ?

## 2021-09-26 ENCOUNTER — Encounter: Payer: Self-pay | Admitting: Neurology

## 2021-09-26 ENCOUNTER — Ambulatory Visit (INDEPENDENT_AMBULATORY_CARE_PROVIDER_SITE_OTHER): Payer: Medicare PPO | Admitting: Neurology

## 2021-09-26 ENCOUNTER — Other Ambulatory Visit: Payer: Self-pay

## 2021-09-26 VITALS — BP 110/58 | HR 99 | Ht 67.0 in | Wt 236.2 lb

## 2021-09-26 DIAGNOSIS — R519 Headache, unspecified: Secondary | ICD-10-CM

## 2021-09-26 NOTE — Patient Instructions (Signed)
I don't suspect anything serious such as tumor or aneurysm.  It is just a headache.  If it is not disruptive to your quality of life, we don't need to do anything.  Otherwise, we can treat with a medication (usually an antidepressant or anti-seizure medication).  If symptoms get worse, please follow up. ?

## 2021-09-30 ENCOUNTER — Other Ambulatory Visit: Payer: Self-pay | Admitting: Internal Medicine

## 2021-10-05 ENCOUNTER — Other Ambulatory Visit (INDEPENDENT_AMBULATORY_CARE_PROVIDER_SITE_OTHER): Payer: Medicare PPO

## 2021-10-05 ENCOUNTER — Other Ambulatory Visit: Payer: Self-pay

## 2021-10-05 DIAGNOSIS — E1122 Type 2 diabetes mellitus with diabetic chronic kidney disease: Secondary | ICD-10-CM | POA: Diagnosis not present

## 2021-10-05 DIAGNOSIS — N1831 Chronic kidney disease, stage 3a: Secondary | ICD-10-CM

## 2021-10-05 LAB — COMPREHENSIVE METABOLIC PANEL
ALT: 9 U/L (ref 0–35)
AST: 13 U/L (ref 0–37)
Albumin: 4 g/dL (ref 3.5–5.2)
Alkaline Phosphatase: 107 U/L (ref 39–117)
BUN: 21 mg/dL (ref 6–23)
CO2: 31 mEq/L (ref 19–32)
Calcium: 9.3 mg/dL (ref 8.4–10.5)
Chloride: 102 mEq/L (ref 96–112)
Creatinine, Ser: 1.11 mg/dL (ref 0.40–1.20)
GFR: 45.43 mL/min — ABNORMAL LOW (ref >60.00)
Glucose, Bld: 115 mg/dL — ABNORMAL HIGH (ref 70–99)
Potassium: 3.6 mEq/L (ref 3.5–5.1)
Sodium: 137 mEq/L (ref 135–145)
Total Bilirubin: 1.1 mg/dL (ref 0.2–1.2)
Total Protein: 7.1 g/dL (ref 6.0–8.3)

## 2021-10-05 LAB — CBC WITH DIFFERENTIAL/PLATELET
Basophils Absolute: 0.1 10*3/uL (ref 0.0–0.1)
Basophils Relative: 0.9 % (ref 0.0–3.0)
Eosinophils Absolute: 0.2 10*3/uL (ref 0.0–0.7)
Eosinophils Relative: 2 % (ref 0.0–5.0)
HCT: 32.4 % — ABNORMAL LOW (ref 36.0–46.0)
Hemoglobin: 10.6 g/dL — ABNORMAL LOW (ref 12.0–15.0)
Lymphocytes Relative: 15 % (ref 12.0–46.0)
Lymphs Abs: 1.2 10*3/uL (ref 0.7–4.0)
MCHC: 32.7 g/dL (ref 30.0–36.0)
MCV: 95 fl (ref 78.0–100.0)
Monocytes Absolute: 0.5 10*3/uL (ref 0.1–1.0)
Monocytes Relative: 5.7 % (ref 3.0–12.0)
Neutro Abs: 6.2 10*3/uL (ref 1.4–7.7)
Neutrophils Relative %: 76.4 % (ref 43.0–77.0)
Platelets: 286 10*3/uL (ref 150.0–400.0)
RBC: 3.41 Mil/uL — ABNORMAL LOW (ref 3.87–5.11)
RDW: 12.1 % (ref 11.5–15.5)
WBC: 8.1 10*3/uL (ref 4.0–10.5)

## 2021-10-05 LAB — HEMOGLOBIN A1C: Hgb A1c MFr Bld: 5.9 % (ref 4.6–6.5)

## 2021-10-09 ENCOUNTER — Encounter: Payer: Self-pay | Admitting: Internal Medicine

## 2021-10-09 NOTE — Patient Instructions (Addendum)
? ? ? ?  Blood work was ordered.   ? ? ?Medications changes include :   none ? ? ? ?Return for schedule labs. A few days prior to your next appt. ? ?

## 2021-10-09 NOTE — Progress Notes (Signed)
? ? ? ? ?Subjective:  ? ? Patient ID: [name], female    DOB: 12/09/1934, 87 y.o.   MRN: [id number] ? ?This visit occurred during the SARS-CoV-2 public health emergency.  Safety protocols were in place, including screening questions prior to the visit, additional usage of staff PPE, and extensive cleaning of exam room while observing appropriate contact time as indicated for disinfecting solutions.   ? ? ?HPI ?[name] is here for follow up of her chronic medical problems, including dm, CKD, anemia-likely anemia of chronic disease ? ?Wants to review recent labs ? ?Rybelsus - always feels sick on her stomach and feels more tired.   ? ? ? ?Medications and allergies reviewed with patient and updated if appropriate. ? ?Current Outpatient Medications on File Prior to Visit  ?Medication Sig Dispense Refill  ? albuterol (VENTOLIN HFA) 108 (90 Base) MCG/ACT inhaler INHALE 2 PUFFS BY MOUTH EVERY 6 HOURS AS NEEDED FOR WHEEZING OR SHORTNESS OF BREATH 18 g 5  ? Alum Hydroxide-Mag Carbonate (GAVISCON PO) Take 1 Dose by mouth as directed.    ? amLODipine (NORVASC) 5 MG tablet TAKE 1 TABLET(5 MG) BY MOUTH DAILY 90 tablet 1  ? aspirin 81 MG tablet Take 81 mg by mouth daily.    ? Aspirin-Acetaminophen-Caffeine (EXCEDRIN PO) Take 1 Dose by mouth as directed.    ? Cholecalciferol (VITAMIN D3) 50 MCG (2000 UT) capsule Take 1 capsule (2,000 Units total) by mouth daily. 100 capsule 3  ? fluticasone (FLONASE) 50 MCG/ACT nasal spray SHAKE LIQUID AND USE 2 SPRAYS IN EACH NOSTRIL DAILY 48 g 1  ? furosemide (LASIX) 40 MG tablet TAKE 1 TABLET(40 MG) BY MOUTH DAILY 90 tablet 1  ? loratadine (CLARITIN) 10 MG tablet Take 1 tablet (10 mg total) by mouth daily. 30 tablet 11  ? LORazepam (ATIVAN) 0.5 MG tablet TAKE 1 TABLET(0.5 MG) BY MOUTH DAILY AS NEEDED FOR ANXIETY 30 tablet 2  ? losartan (COZAAR) 100 MG tablet Take 1 tablet (100 mg total) by mouth daily. 90 tablet 3  ? rosuvastatin (CRESTOR) 10 MG tablet Take 1 tablet (10 mg total) by mouth  daily. 90 tablet 3  ? sertraline (ZOLOFT) 50 MG tablet TAKE 1 TABLET(50 MG) BY MOUTH DAILY 90 tablet 1  ? ?No current facility-administered medications on file prior to visit.  ? ? ? ?Review of Systems ? ?   ?Objective:  ? ?Vitals:  ? 10/10/21 0955  ?BP: 138/68  ?Pulse: 73  ?Temp: 98 ?F (36.7 ?C)  ?SpO2: 96%  ? ?BP Readings from Last 3 Encounters:  ?10/10/21 138/68  ?09/26/21 (!) 110/58  ?08/03/21 (!) 142/68  ? ?Wt Readings from Last 3 Encounters:  ?10/10/21 239 lb (108.4 kg)  ?09/26/21 236 lb 3.2 oz (107.1 kg)  ?08/03/21 245 lb 3.2 oz (111.2 kg)  ? ?Body mass index is 37.43 kg/m?. ? ?  ?Physical Exam ?Constitutional:   ?   Appearance: Normal appearance.  ?Skin: ?   General: Skin is warm and dry.  ?   Findings: No rash.  ?Neurological:  ?   Mental Status: She is alert. Mental status is at baseline.  ?Psychiatric:     ?   Mood and Affect: Mood normal.  ? ?   ? ?Lab Results  ?Component Value Date  ? WBC 8.1 10/05/2021  ? HGB 10.6 (L) 10/05/2021  ? HCT 32.4 (L) 10/05/2021  ? PLT 286.0 10/05/2021  ? GLUCOSE 115 (H) 10/05/2021  ? CHOL 116 07/04/2021  ? TRIG 68.0  07/04/2021  ? HDL 30.20 (L) 07/04/2021  ? [place] 72 07/04/2021  ? ALT 9 10/05/2021  ? AST 13 10/05/2021  ? NA 137 10/05/2021  ? K 3.6 10/05/2021  ? CL 102 10/05/2021  ? CREATININE 1.11 10/05/2021  ? BUN 21 10/05/2021  ? CO2 31 10/05/2021  ? TSH 2.22 01/12/2020  ? HGBA1C 5.9 10/05/2021  ? ? ? ?Assessment & Plan:  ? ? ?See Problem List for Assessment and Plan of chronic medical problems.  ? ? ?

## 2021-10-10 ENCOUNTER — Ambulatory Visit (INDEPENDENT_AMBULATORY_CARE_PROVIDER_SITE_OTHER): Payer: Medicare PPO | Admitting: Internal Medicine

## 2021-10-10 VITALS — BP 138/68 | HR 73 | Temp 98.0°F | Ht 67.0 in | Wt 239.0 lb

## 2021-10-10 DIAGNOSIS — I10 Essential (primary) hypertension: Secondary | ICD-10-CM

## 2021-10-10 DIAGNOSIS — D649 Anemia, unspecified: Secondary | ICD-10-CM

## 2021-10-10 DIAGNOSIS — E1122 Type 2 diabetes mellitus with diabetic chronic kidney disease: Secondary | ICD-10-CM | POA: Diagnosis not present

## 2021-10-10 DIAGNOSIS — I700 Atherosclerosis of aorta: Secondary | ICD-10-CM

## 2021-10-10 DIAGNOSIS — N1831 Chronic kidney disease, stage 3a: Secondary | ICD-10-CM

## 2021-10-10 MED ORDER — RYBELSUS 3 MG PO TABS: 3.0000 mg | ORAL_TABLET | Freq: Every day | ORAL | 3 refills | Status: DC

## 2021-10-10 NOTE — Assessment & Plan Note (Signed)
Chronic ?Blood pressure controlled ?Continue amlodipine 5 mg daily, losartan 100 mg daily ?Low-sodium diet ?Encouraged weight loss ?May need to consider adjusting medication if blood pressure is borderline or not well controlled at her next visit ?

## 2021-10-10 NOTE — Assessment & Plan Note (Signed)
Chronic ?Continue rosuvastatin 10 mg daily ?

## 2021-10-10 NOTE — Assessment & Plan Note (Signed)
Chronic ?We started Rybelsus 3 mg daily just over 2 months ago and her sugars have significantly improved ?Unfortunately she is experiencing upset stomach and fatigue that is not improving ?Discussed other medications that we can consider-Jardiance 10 mg daily, glimepiride low-dose-discussed possible side effects of each ?At this point she would like to continue the Rybelsus since she has that at home and will think about other options-she is happy that her sugars are better ?Encouraged weight loss, diabetic diet ?

## 2021-10-10 NOTE — Assessment & Plan Note (Signed)
Chronic ?Overall stable ?Discussed causes of CKD and things that she and we can do to help prevent progression.  Her kidney function has been stable over the last couple of years so I do not think it is progressing ?Stressed good blood pressure control, good sugar control, good hydration, avoiding NSAIDs ?CMP, urine microalbumin before next appointment ?

## 2021-10-10 NOTE — Assessment & Plan Note (Signed)
Chronic ?Anemia has been longstanding and has been normochromic normocytic in nature ?Likely anemia of chronic disease or genetic in nature ?We will continue to monitor ?Check CBC, iron panel at next visit ?

## 2021-10-11 DIAGNOSIS — H26493 Other secondary cataract, bilateral: Secondary | ICD-10-CM | POA: Diagnosis not present

## 2021-10-11 DIAGNOSIS — H16143 Punctate keratitis, bilateral: Secondary | ICD-10-CM | POA: Diagnosis not present

## 2021-10-11 DIAGNOSIS — H35033 Hypertensive retinopathy, bilateral: Secondary | ICD-10-CM | POA: Diagnosis not present

## 2021-10-11 DIAGNOSIS — H43813 Vitreous degeneration, bilateral: Secondary | ICD-10-CM | POA: Diagnosis not present

## 2021-10-17 DIAGNOSIS — D3132 Benign neoplasm of left choroid: Secondary | ICD-10-CM | POA: Diagnosis not present

## 2021-10-17 DIAGNOSIS — H26493 Other secondary cataract, bilateral: Secondary | ICD-10-CM | POA: Diagnosis not present

## 2021-10-17 DIAGNOSIS — E119 Type 2 diabetes mellitus without complications: Secondary | ICD-10-CM | POA: Diagnosis not present

## 2021-10-17 DIAGNOSIS — H43813 Vitreous degeneration, bilateral: Secondary | ICD-10-CM | POA: Diagnosis not present

## 2021-11-16 ENCOUNTER — Other Ambulatory Visit: Payer: Self-pay | Admitting: Internal Medicine

## 2021-11-28 ENCOUNTER — Other Ambulatory Visit: Payer: Self-pay | Admitting: Internal Medicine

## 2021-11-29 NOTE — Telephone Encounter (Signed)
Spoke with patient and she was fine with staying on the 3 mg. No concerns for weight loss

## 2021-12-20 ENCOUNTER — Other Ambulatory Visit (INDEPENDENT_AMBULATORY_CARE_PROVIDER_SITE_OTHER): Payer: Medicare PPO

## 2021-12-20 DIAGNOSIS — E1122 Type 2 diabetes mellitus with diabetic chronic kidney disease: Secondary | ICD-10-CM | POA: Diagnosis not present

## 2021-12-20 DIAGNOSIS — N1831 Chronic kidney disease, stage 3a: Secondary | ICD-10-CM

## 2021-12-20 DIAGNOSIS — I10 Essential (primary) hypertension: Secondary | ICD-10-CM

## 2021-12-20 DIAGNOSIS — D649 Anemia, unspecified: Secondary | ICD-10-CM | POA: Diagnosis not present

## 2021-12-20 LAB — COMPREHENSIVE METABOLIC PANEL
ALT: 10 U/L (ref 0–35)
AST: 13 U/L (ref 0–37)
Albumin: 3.9 g/dL (ref 3.5–5.2)
Alkaline Phosphatase: 106 U/L (ref 39–117)
BUN: 26 mg/dL — ABNORMAL HIGH (ref 6–23)
CO2: 28 mEq/L (ref 19–32)
Calcium: 9.4 mg/dL (ref 8.4–10.5)
Chloride: 100 mEq/L (ref 96–112)
Creatinine, Ser: 1.25 mg/dL — ABNORMAL HIGH (ref 0.40–1.20)
GFR: 39.33 mL/min — ABNORMAL LOW (ref >60.00)
Glucose, Bld: 136 mg/dL — ABNORMAL HIGH (ref 70–99)
Potassium: 4 mEq/L (ref 3.5–5.1)
Sodium: 135 mEq/L (ref 135–145)
Total Bilirubin: 0.8 mg/dL (ref 0.2–1.2)
Total Protein: 7.4 g/dL (ref 6.0–8.3)

## 2021-12-20 LAB — CBC WITH DIFFERENTIAL/PLATELET
Basophils Absolute: 0.1 10*3/uL (ref 0.0–0.1)
Basophils Relative: 0.9 % (ref 0.0–3.0)
Eosinophils Absolute: 0.2 10*3/uL (ref 0.0–0.7)
Eosinophils Relative: 2 % (ref 0.0–5.0)
HCT: 32.2 % — ABNORMAL LOW (ref 36.0–46.0)
Hemoglobin: 10.5 g/dL — ABNORMAL LOW (ref 12.0–15.0)
Lymphocytes Relative: 15.4 % (ref 12.0–46.0)
Lymphs Abs: 1.4 10*3/uL (ref 0.7–4.0)
MCHC: 32.6 g/dL (ref 30.0–36.0)
MCV: 95 fl (ref 78.0–100.0)
Monocytes Absolute: 0.7 10*3/uL (ref 0.1–1.0)
Monocytes Relative: 8.3 % (ref 3.0–12.0)
Neutro Abs: 6.5 10*3/uL (ref 1.4–7.7)
Neutrophils Relative %: 73.4 % (ref 43.0–77.0)
Platelets: 275 10*3/uL (ref 150.0–400.0)
RBC: 3.39 Mil/uL — ABNORMAL LOW (ref 3.87–5.11)
RDW: 12.3 % (ref 11.5–15.5)
WBC: 8.9 10*3/uL (ref 4.0–10.5)

## 2021-12-20 LAB — LIPID PANEL
Cholesterol: 116 mg/dL (ref 0–200)
HDL: 27.7 mg/dL — ABNORMAL LOW (ref >39.00)
LDL Cholesterol: 74 mg/dL (ref 0–99)
NonHDL: 88.04
Total CHOL/HDL Ratio: 4
Triglycerides: 68 mg/dL (ref 0.0–149.0)
VLDL: 13.6 mg/dL (ref 0.0–40.0)

## 2021-12-20 LAB — IBC PANEL
Iron: 60 ug/dL (ref 42–145)
Saturation Ratios: 20.8 % (ref 20.0–50.0)
TIBC: 288.4 ug/dL (ref 250.0–450.0)
Transferrin: 206 mg/dL — ABNORMAL LOW (ref 212.0–360.0)

## 2021-12-20 LAB — HEMOGLOBIN A1C: Hgb A1c MFr Bld: 5.6 % (ref 4.6–6.5)

## 2021-12-20 LAB — FERRITIN: Ferritin: 449.1 ng/mL — ABNORMAL HIGH (ref 10.0–291.0)

## 2021-12-20 LAB — MICROALBUMIN / CREATININE URINE RATIO
Creatinine,U: 141.9 mg/dL
Microalb Creat Ratio: 0.5 mg/g (ref 0.0–30.0)
Microalb, Ur: <0.7 mg/dL (ref 0.0–1.9)

## 2021-12-27 ENCOUNTER — Ambulatory Visit: Payer: Medicare PPO | Admitting: Internal Medicine

## 2021-12-30 ENCOUNTER — Encounter: Payer: Self-pay | Admitting: Internal Medicine

## 2021-12-30 NOTE — Patient Instructions (Signed)
     Medications changes include :   try taking taking the lasix every other day   Your prescription(s) have been sent to your pharmacy.    A referral was ordered for a Kidney specialist.     Someone from that office will call you to schedule an appointment.    Return in about 6 months (around 07/02/2022) for follow up.

## 2021-12-30 NOTE — Progress Notes (Unsigned)
Subjective:    Patient ID: [name], female    DOB: [birth year], 87 y.o.   MRN: [id number]     HPI [name] is here for follow up of her chronic medical problems, including DM, htn, hld, CKD, anemia of chronic disease, anxiety, depression, asthma, chronic knee pain from OA  She does feel like she needs lasix daily.  She has puffy fingers at times.  She denies ankle swelling - it has been years.    Has tried taking 2 ativan at night and it helps more than 1 pill.  Wonders about increasing dose.   Medications and allergies reviewed with patient and updated if appropriate.  Current Outpatient Medications on File Prior to Visit  Medication Sig Dispense Refill   albuterol (VENTOLIN HFA) 108 (90 Base) MCG/ACT inhaler INHALE 2 PUFFS BY MOUTH EVERY 6 HOURS AS NEEDED FOR WHEEZING OR SHORTNESS OF BREATH 18 g 5   Alum Hydroxide-Mag Carbonate (GAVISCON PO) Take 1 Dose by mouth as directed.     amLODipine (NORVASC) 5 MG tablet TAKE 1 TABLET(5 MG) BY MOUTH DAILY 90 tablet 1   aspirin 81 MG tablet Take 81 mg by mouth daily.     Aspirin-Acetaminophen-Caffeine (EXCEDRIN PO) Take 1 Dose by mouth as directed.     Cholecalciferol (VITAMIN D3) 50 MCG (2000 UT) capsule Take 1 capsule (2,000 Units total) by mouth daily. 100 capsule 3   fluticasone (FLONASE) 50 MCG/ACT nasal spray SHAKE LIQUID AND USE 2 SPRAYS IN EACH NOSTRIL DAILY 48 g 1   furosemide (LASIX) 40 MG tablet TAKE 1 TABLET(40 MG) BY MOUTH DAILY 90 tablet 1   loratadine (CLARITIN) 10 MG tablet Take 1 tablet (10 mg total) by mouth daily. 30 tablet 11   losartan (COZAAR) 100 MG tablet Take 1 tablet (100 mg total) by mouth daily. 90 tablet 3   rosuvastatin (CRESTOR) 10 MG tablet Take 1 tablet (10 mg total) by mouth daily. 90 tablet 3   RYBELSUS 3 MG TABS TAKE 1 TABLET BY MOUTH DAILY 30 MINUTES BEFORE BREAKFAST 30 tablet 3   sertraline (ZOLOFT) 50 MG tablet TAKE 1 TABLET(50 MG) BY MOUTH DAILY 90 tablet 1   No current facility-administered  medications on file prior to visit.     Review of Systems  Constitutional:  Negative for chills and fever.  Respiratory:  Positive for shortness of breath (with any exertion - chronic). Negative for cough and wheezing.   Cardiovascular:  Negative for chest pain, palpitations and leg swelling.  Neurological:  Negative for light-headedness and headaches.       Objective:   Vitals:   12/31/21 1307  BP: 136/78  Pulse: 69  Temp: 98 F (36.7 C)  SpO2: 98%   BP Readings from Last 3 Encounters:  12/31/21 136/78  10/10/21 138/68  09/26/21 (!) 110/58   Wt Readings from Last 3 Encounters:  12/31/21 234 lb (106.1 kg)  10/10/21 239 lb (108.4 kg)  09/26/21 236 lb 3.2 oz (107.1 kg)   Body mass index is 36.65 kg/m.    Physical Exam Constitutional:      General: She is not in acute distress.    Appearance: Normal appearance.  HENT:     Head: Normocephalic and atraumatic.  Eyes:     Conjunctiva/sclera: Conjunctivae normal.  Cardiovascular:     Rate and Rhythm: Normal rate and regular rhythm.     Heart sounds: Normal heart sounds. No murmur heard. Pulmonary:     Effort: Pulmonary effort  is normal. No respiratory distress.     Breath sounds: Normal breath sounds. No wheezing.  Musculoskeletal:     Cervical back: Neck supple.     Right lower leg: No edema.     Left lower leg: No edema.  Lymphadenopathy:     Cervical: No cervical adenopathy.  Skin:    General: Skin is warm and dry.     Findings: No rash.  Neurological:     Mental Status: She is alert. Mental status is at baseline.  Psychiatric:        Mood and Affect: Mood normal.        Behavior: Behavior normal.        Lab Results  Component Value Date   WBC 8.9 12/20/2021   HGB 10.5 (L) 12/20/2021   HCT 32.2 (L) 12/20/2021   PLT 275.0 12/20/2021   GLUCOSE 136 (H) 12/20/2021   CHOL 116 12/20/2021   TRIG 68.0 12/20/2021   HDL 27.70 (L) 12/20/2021   LDLCALC 74 12/20/2021   ALT 10 12/20/2021   AST 13  12/20/2021   NA 135 12/20/2021   K 4.0 12/20/2021   CL 100 12/20/2021   CREATININE 1.25 (H) 12/20/2021   BUN 26 (H) 12/20/2021   CO2 28 12/20/2021   TSH 2.22 01/12/2020   HGBA1C 5.6 12/20/2021   MICROALBUR <0.7 12/20/2021     Assessment & Plan:    See Problem List for Assessment and Plan of chronic medical problems.

## 2021-12-31 ENCOUNTER — Ambulatory Visit (INDEPENDENT_AMBULATORY_CARE_PROVIDER_SITE_OTHER): Payer: Medicare PPO | Admitting: Internal Medicine

## 2021-12-31 VITALS — BP 136/78 | HR 69 | Temp 98.0°F | Ht 67.0 in | Wt 234.0 lb

## 2021-12-31 DIAGNOSIS — D649 Anemia, unspecified: Secondary | ICD-10-CM | POA: Diagnosis not present

## 2021-12-31 DIAGNOSIS — J4520 Mild intermittent asthma, uncomplicated: Secondary | ICD-10-CM | POA: Diagnosis not present

## 2021-12-31 DIAGNOSIS — F419 Anxiety disorder, unspecified: Secondary | ICD-10-CM | POA: Diagnosis not present

## 2021-12-31 DIAGNOSIS — N1831 Chronic kidney disease, stage 3a: Secondary | ICD-10-CM

## 2021-12-31 DIAGNOSIS — F32A Depression, unspecified: Secondary | ICD-10-CM | POA: Diagnosis not present

## 2021-12-31 DIAGNOSIS — E7849 Other hyperlipidemia: Secondary | ICD-10-CM

## 2021-12-31 DIAGNOSIS — E1122 Type 2 diabetes mellitus with diabetic chronic kidney disease: Secondary | ICD-10-CM | POA: Diagnosis not present

## 2021-12-31 DIAGNOSIS — I10 Essential (primary) hypertension: Secondary | ICD-10-CM

## 2021-12-31 MED ORDER — LORAZEPAM 0.5 MG PO TABS
ORAL_TABLET | ORAL | 5 refills | Status: DC
Start: 2021-12-31 — End: 2022-07-19

## 2021-12-31 NOTE — Assessment & Plan Note (Addendum)
Chronic  Lab Results  Component Value Date   HGBA1C 5.6 12/20/2021   Sugars well controlled Continue Rybelsus 3 mg daily Stressed regular exercise, diabetic diet

## 2021-12-31 NOTE — Assessment & Plan Note (Signed)
Chronic Controlled, Stable Continue albuterol inhaler as needed

## 2021-12-31 NOTE — Assessment & Plan Note (Addendum)
Chronic Reviewed cmp Sugars well controlled, BP well controlled Continue to work on weight loss Will try to decrease lasix to QOD Referral to nephrology

## 2021-12-31 NOTE — Assessment & Plan Note (Addendum)
Chronic Blood pressure well controlled CMP reviewed Continue amlodipine 5 mg daily, losartan 100 mg daily

## 2021-12-31 NOTE — Assessment & Plan Note (Signed)
Chronic Normocytic anemia-likely anemia of chronic disease Check CBC

## 2021-12-31 NOTE — Assessment & Plan Note (Addendum)
Chronic Controlled, Stable Continue Sertraline 50 mg daily, lorazepam 0.5 mg daily as needed Takes ativan in evening - helps with anxiety/relaxation/sleep - has been on it for years -- wanted to increase dose to 1 mg which works better- advised to try to stay at 0.5 mg daily and add tylenol at night first to see if that helps

## 2021-12-31 NOTE — Assessment & Plan Note (Signed)
Chronic Regular exercise and healthy diet encouraged Check lipid panel  Continue Crestor 10 mg daily 

## 2021-12-31 NOTE — Assessment & Plan Note (Signed)
Chronic Continue Rybelsus 3 mg daily-sugars well controlled, but hoping this medication will help with continued weight loss Encouraged high-protein and vegetable diet low in carbs/sugars Encouraged decrease portions Encourage as much activity as tolerated with severe knee arthritis

## 2022-01-23 ENCOUNTER — Telehealth: Payer: Self-pay | Admitting: Internal Medicine

## 2022-01-23 NOTE — Telephone Encounter (Signed)
Pt states that [place] would like a statement from doctor stating that antibiotic be  adminstered before oral surgery      Pt will be having procedure          [id number]  [id number]  [address] [place] Email:[email or web address]    Please Advise

## 2022-01-24 ENCOUNTER — Other Ambulatory Visit: Payer: Self-pay | Admitting: Internal Medicine

## 2022-01-25 NOTE — Telephone Encounter (Signed)
Called and spoke with Janett Billow.  She states patient doesn't need and abx from [place] because they will prescribe when needed as she has not been scheduled.

## 2022-02-19 ENCOUNTER — Ambulatory Visit (INDEPENDENT_AMBULATORY_CARE_PROVIDER_SITE_OTHER): Payer: Medicare PPO | Admitting: Family Medicine

## 2022-02-19 ENCOUNTER — Ambulatory Visit (HOSPITAL_COMMUNITY)
Admission: RE | Admit: 2022-02-19 | Discharge: 2022-02-19 | Disposition: A | Discharge status: Home / Self Care | Payer: Medicare PPO | Source: Ambulatory Visit | Attending: Cardiology | Admitting: Cardiology

## 2022-02-19 ENCOUNTER — Encounter: Payer: Self-pay | Admitting: Family Medicine

## 2022-02-19 ENCOUNTER — Telehealth: Payer: Self-pay

## 2022-02-19 VITALS — BP 132/66 | HR 56 | Temp 97.6°F | Ht 67.0 in

## 2022-02-19 DIAGNOSIS — M79661 Pain in right lower leg: Secondary | ICD-10-CM

## 2022-02-19 DIAGNOSIS — M7989 Other specified soft tissue disorders: Secondary | ICD-10-CM

## 2022-02-19 DIAGNOSIS — M25571 Pain in right ankle and joints of right foot: Secondary | ICD-10-CM | POA: Insufficient documentation

## 2022-02-19 DIAGNOSIS — M25561 Pain in right knee: Secondary | ICD-10-CM | POA: Diagnosis not present

## 2022-02-19 MED ORDER — TRAMADOL HCL 50 MG PO TABS: 50.0000 mg | ORAL_TABLET | Freq: Three times a day (TID) | ORAL | 0 refills | Status: AC | PRN

## 2022-02-19 NOTE — Assessment & Plan Note (Signed)
Tramadol for pain. Ice and elevate. Refer to sports medicine if [place] neg for DVT

## 2022-02-19 NOTE — Telephone Encounter (Signed)
Received call from [place] at [facility] which called to report that pt's right leg is negative for DVT, however pt has Suprapateller fluid above the right knee cap. Would you like pt to schedule f/u?

## 2022-02-19 NOTE — Assessment & Plan Note (Signed)
Will get Doppler US to r/o DVT or obstruction Tramadol for pain. If [place] neg, will refer to sports medicine

## 2022-02-19 NOTE — Assessment & Plan Note (Signed)
Unremarkable knee exam. Tramadol prescribed, Tylenol not effective. Elevate and ice. Refer to sports medicine if [place] negative for DVT

## 2022-02-19 NOTE — Patient Instructions (Signed)
I have ordered an ultrasound of your right lower leg to be done right away. You should receive a call to schedule this.   Elevate your leg when sitting and you may use an ice pack on the knee and ankle.   Tramadol prescribed for pain. Use this only for moderate to severe pain. This medication can be sedating so be careful when you take it.   Follow up with me pending the results of the ultrasound.

## 2022-02-19 NOTE — Progress Notes (Signed)
Subjective:     Patient ID: [name], female    DOB: [birth year], 87 y.o.   MRN: [id number]  Chief Complaint  Patient presents with   Leg Pain    Right leg continuous pain behind the knee accompanied by ankle pain. Has been using advil which helped some but cant use too much due to her kidney function. Rates pain 11/10 but has since subsided some.     HPI Patient is in today for a one week hx of right lower leg pain and swelling. Reports pain behind he right knee and right anterior ankle. Pain is worse with walking but constant at rest. Tylenol is not helping. States she cannot take NSAIDs.   Denies hx of DVT. No recent surgery or immobilization.   Denies fever, chills, dizziness, chest pain, palpitations, shortness of breath, abdominal pain, N/V/D.     Health Maintenance Due  Topic Date Due   FOOT EXAM  Never done   OPHTHALMOLOGY EXAM  Never done    Past Medical History:  Diagnosis Date   Allergy    Anemia    Asthma    adult onset   Carotid bruit    left   Hyperlipidemia    LDL goal = < 120 based on NMR Lipoprofile 2005   Hypertension    Metabolic syndrome     Past Surgical History:  Procedure Laterality Date   CARDIAC CATHETERIZATION  2006   negative   CATARACT EXTRACTION, BILATERAL     Dr Bing Plume   COLONOSCOPY  06/02/2006   Dr.Kaplan,Next Due date 06/02/2016   SHOULDER SURGERY  11/21/2008   Dr.Aplington   TOTAL ABDOMINAL HYSTERECTOMY W/ BILATERAL SALPINGOOPHORECTOMY  1983   Dr.Fore, for fibroids with pain   TOTAL HIP ARTHROPLASTY  2005, 2006    Family History  Problem Relation Age of Onset   Hypertension Father    Stroke Father        > 53   Hypertension Mother    Arthritis Mother    Hypertension Sister    Diabetes Paternal Grandmother    Heart attack Maternal Grandfather        age unknown; ? > 47   Stomach cancer Maternal Grandmother     Social History   Socioeconomic History   Marital status: Widowed    Spouse name: Not on file   Number  of children: Not on file   Years of education: Not on file   Highest education level: Not on file  Occupational History   Not on file  Tobacco Use   Smoking status: Never   Smokeless tobacco: Never  Substance and Sexual Activity   Alcohol use: No   Drug use: No   Sexual activity: Not on file  Other Topics Concern   Not on file  Social History Narrative   Right handed   Social Determinants of Health   Financial Resource Strain: Not on file  Food Insecurity: Not on file  Transportation Needs: Not on file  Physical Activity: Not on file  Stress: Not on file  Social Connections: Not on file  Intimate Partner Violence: Not on file    Outpatient Medications Prior to Visit  Medication Sig Dispense Refill   albuterol (VENTOLIN HFA) 108 (90 Base) MCG/ACT inhaler INHALE 2 PUFFS BY MOUTH EVERY 6 HOURS AS NEEDED FOR WHEEZING OR SHORTNESS OF BREATH 18 g 5   Alum Hydroxide-Mag Carbonate (GAVISCON PO) Take 1 Dose by mouth as directed.     amLODipine (NORVASC)  5 MG tablet TAKE 1 TABLET(5 MG) BY MOUTH DAILY 90 tablet 1   aspirin 81 MG tablet Take 81 mg by mouth daily.     Aspirin-Acetaminophen-Caffeine (EXCEDRIN PO) Take 1 Dose by mouth as directed.     Cholecalciferol (VITAMIN D3) 50 MCG (2000 UT) capsule Take 1 capsule (2,000 Units total) by mouth daily. 100 capsule 3   fluticasone (FLONASE) 50 MCG/ACT nasal spray SHAKE LIQUID AND USE 2 SPRAYS IN EACH NOSTRIL DAILY 48 g 1   furosemide (LASIX) 40 MG tablet TAKE 1 TABLET(40 MG) BY MOUTH DAILY 90 tablet 1   loratadine (CLARITIN) 10 MG tablet Take 1 tablet (10 mg total) by mouth daily. 30 tablet 11   LORazepam (ATIVAN) 0.5 MG tablet TAKE 1 TABLET(0.5 MG) BY MOUTH DAILY AS NEEDED FOR ANXIETY 30 tablet 5   losartan (COZAAR) 100 MG tablet Take 1 tablet (100 mg total) by mouth daily. 90 tablet 3   rosuvastatin (CRESTOR) 10 MG tablet TAKE 1 TABLET(10 MG) BY MOUTH DAILY 90 tablet 3   RYBELSUS 3 MG TABS TAKE 1 TABLET BY MOUTH DAILY 30 MINUTES BEFORE  BREAKFAST 30 tablet 3   sertraline (ZOLOFT) 50 MG tablet TAKE 1 TABLET(50 MG) BY MOUTH DAILY 90 tablet 1   No facility-administered medications prior to visit.    Allergies  Allergen Reactions   Latex Hives   Semaglutide Other (See Comments)    Rybelsus - nausea on 3 mg, fatigue    ROS Pertinent positives and negatives in the history of present illness.     Objective:    Physical Exam Constitutional:      General: She is not in acute distress.    Appearance: She is not ill-appearing.  Eyes:     Conjunctiva/sclera: Conjunctivae normal.     Pupils: Pupils are equal, round, and reactive to light.  Cardiovascular:     Rate and Rhythm: Regular rhythm. Bradycardia present.     Pulses: Normal pulses.  Pulmonary:     Effort: Pulmonary effort is normal.     Breath sounds: Normal breath sounds.  Musculoskeletal:     Right lower leg: Tenderness present. Edema present.     Right ankle: Swelling present. Normal range of motion. Normal pulse.     Right foot: Normal range of motion and normal capillary refill. Swelling present. No tenderness. Normal pulse.     Comments: Mild edema to right lower leg with marked tenderness to palpation of right calf.   Skin:    General: Skin is warm and dry.  Neurological:     General: No focal deficit present.     Mental Status: She is alert and oriented to person, place, and time.     Sensory: No sensory deficit.     Motor: No weakness.  Psychiatric:        Mood and Affect: Mood normal.        Behavior: Behavior normal.        Thought Content: Thought content normal.     BP 132/66 (BP Location: Left Arm, Patient Position: Sitting, Cuff Size: Large)   Pulse (!) 56   Temp 97.6 F (36.4 C) (Temporal)   Ht '5\' 7"'$  (1.702 m)   SpO2 98%   BMI 36.65 kg/m  Wt Readings from Last 3 Encounters:  12/31/21 234 lb (106.1 kg)  10/10/21 239 lb (108.4 kg)  09/26/21 236 lb 3.2 oz (107.1 kg)       Assessment & Plan:   Problem List Items Addressed  This Visit       Other   Acute right ankle pain    Tramadol for pain. Ice and elevate. Refer to sports medicine if [place] neg for DVT      Relevant Medications   traMADol (ULTRAM) 50 MG tablet   Pain and swelling of right lower leg - Primary    Will get Doppler US to r/o DVT or obstruction Tramadol for pain. If [place] neg, will refer to sports medicine      Relevant Medications   traMADol (ULTRAM) 50 MG tablet   Other Relevant Orders   VAS [place] LOWER EXTREMITY VENOUS (DVT) (Completed)   Posterior knee pain, right    Unremarkable knee exam. Tramadol prescribed, Tylenol not effective. Elevate and ice. Refer to sports medicine if [place] negative for DVT      Relevant Medications   traMADol (ULTRAM) 50 MG tablet    I am having [name] start on traMADol. I am also having her maintain her aspirin, Aspirin-Acetaminophen-Caffeine (EXCEDRIN PO), Alum Hydroxide-Mag Carbonate (GAVISCON PO), loratadine, Vitamin D3, furosemide, sertraline, losartan, albuterol, amLODipine, fluticasone, Rybelsus, LORazepam, and rosuvastatin.  Meds ordered this encounter  Medications   traMADol (ULTRAM) 50 MG tablet    Sig: Take 1 tablet (50 mg total) by mouth every 8 (eight) hours as needed for up to 5 days.    Dispense:  15 tablet    Refill:  0    Order Specific Question:   Supervising Provider    Answer:   Pricilla Holm A [[id number]]

## 2022-02-19 NOTE — Progress Notes (Signed)
No evidence of a blood clot or any obstruction of her right leg. Please refer her to [facility] sports medicine for further evaluation of right lower leg pain and swelling, right ankle pain and right knee pain.

## 2022-02-20 ENCOUNTER — Other Ambulatory Visit: Payer: Self-pay

## 2022-02-20 DIAGNOSIS — M25561 Pain in right knee: Secondary | ICD-10-CM

## 2022-02-20 DIAGNOSIS — M79661 Pain in right lower leg: Secondary | ICD-10-CM

## 2022-02-20 NOTE — Progress Notes (Signed)
Orders placed.

## 2022-02-20 NOTE — Telephone Encounter (Signed)
Referral placed.

## 2022-02-21 NOTE — Progress Notes (Signed)
   Rito Ehrlich, am serving as a Education administrator for Dr. Lynne Leader.  Subjective:    CC: R knee pain  HPI: Pt is an 87 y/o female c/o R knee pain x/2 weeks pain comes and goes maybe lasts a day. PCP ordered a R LE vascular US that was performed on 02/19/22. Pt locates pain to to behind the knee radiates down the leg to the ankle with swelling states when the pain comes on fast it could be above a 10. The pain has since been gone but still notices swelling the right ankle.  R Knee swelling: yes- and into R lower leg Mechanical symptoms: no Radiates: yes Aggravates: walking  Treatments tried: ice, tramadol, tylenol   Dx testing: 02/19/22 R LE vascular US (-)  Pertinent review of Systems: No fevers or chills  Relevant historical information: Positive for diabetes.  No history of gout.   Objective:    Vitals:   02/25/22 1255  BP: 118/60  Pulse: 65  SpO2: 98%   General: Well Developed, well nourished, and in no acute distress.   MSK: Right knee: Mild effusion normal motion with crepitation.  Tender palpation medial and lateral joint line. Stable ligamentous exam. Intact strength.  Lab and Radiology Results  Diagnostic Limited MSK Ultrasound of: Right knee Quad tendon intact normal. Mild joint effusion present superior patellar space. Patellar tendon normal. Lateral joint line mild degenerative changes. Medial joint line significant degenerative changes present. Posterior knee no significant Baker's cyst. Impression: DJD  X-ray images right knee obtained today personally and independently interpreted Moderate medial DJD.  Mild lateral DJD.  Severe patellofemoral DJD.  No acute fractures. Await formal radiology review   Impression and Recommendations:    Assessment and Plan: 87 y.o. female with right knee pain.  Patient had severe right knee pain about 2 weeks ago which lasted for about 2 days.  Subsequently her pain has returned to her baseline of mild chronic knee  pain.  I think the most likely explanation was an acute exacerbation of DJD.  She no longer is requiring tramadol for pain and has moved back to her usual Tylenol.  Plan: Recommended Voltaren gel.  Discussed steroid injection which she declined today.  Check as needed.  PDMP not reviewed this encounter. Orders Placed This Encounter  Procedures   DG Knee AP/LAT W/Sunrise Right    Standing Status:   Future    Number of Occurrences:   1    Standing Expiration Date:   03/24/2022    Order Specific Question:   Reason for Exam (SYMPTOM  OR DIAGNOSIS REQUIRED)    Answer:   right knee pain    Order Specific Question:   Preferred imaging location?    Answer:   [facility]   [place] LIMITED JOINT SPACE STRUCTURES LOW RIGHT(NO LINKED CHARGES)    Order Specific Question:   Reason for Exam (SYMPTOM  OR DIAGNOSIS REQUIRED)    Answer:   right knee pain    Order Specific Question:   Preferred imaging location?    Answer:   [place]   No orders of the defined types were placed in this encounter.   Discussed warning signs or symptoms. Please see discharge instructions. Patient expresses understanding.   The above documentation has been reviewed and is accurate and complete Lynne Leader, M.D.

## 2022-02-22 ENCOUNTER — Other Ambulatory Visit: Payer: Self-pay | Admitting: Internal Medicine

## 2022-02-25 ENCOUNTER — Ambulatory Visit (INDEPENDENT_AMBULATORY_CARE_PROVIDER_SITE_OTHER): Payer: Medicare PPO

## 2022-02-25 ENCOUNTER — Ambulatory Visit: Payer: Self-pay

## 2022-02-25 ENCOUNTER — Ambulatory Visit (INDEPENDENT_AMBULATORY_CARE_PROVIDER_SITE_OTHER): Payer: Medicare PPO | Admitting: Family Medicine

## 2022-02-25 VITALS — BP 118/60 | HR 65 | Ht 67.0 in | Wt 233.0 lb

## 2022-02-25 DIAGNOSIS — M25561 Pain in right knee: Secondary | ICD-10-CM

## 2022-02-25 NOTE — Patient Instructions (Addendum)
Good to see you   Please get an Xray today before you leave   Please use Voltaren gel (Generic Diclofenac Gel) up to 4x daily for pain as needed.  This is available over-the-counter as both the name brand Voltaren gel and the generic diclofenac gel.   Follow up as needed.   We can do a cortisone shot whenever you would like one.

## 2022-02-26 NOTE — Progress Notes (Signed)
Right knee x-ray shows severe arthritis changes.

## 2022-03-05 ENCOUNTER — Ambulatory Visit: Payer: Medicare PPO | Admitting: Internal Medicine

## 2022-03-21 ENCOUNTER — Ambulatory Visit: Payer: Medicare PPO

## 2022-03-25 ENCOUNTER — Ambulatory Visit (INDEPENDENT_AMBULATORY_CARE_PROVIDER_SITE_OTHER): Payer: Medicare PPO

## 2022-03-25 VITALS — Ht 67.0 in | Wt 233.0 lb

## 2022-03-25 DIAGNOSIS — Z0000 Encounter for general adult medical examination without abnormal findings: Secondary | ICD-10-CM | POA: Diagnosis not present

## 2022-03-25 NOTE — Patient Instructions (Signed)
[name] , Thank you for taking time to come for your Medicare Wellness Visit. I appreciate your ongoing commitment to your health goals. Please review the following plan we discussed and let me know if I can assist you in the future.   Screening recommendations/referrals: Colonoscopy: Discontinued Mammogram: 02/23/2021; due every year Bone Density: Never done Recommended yearly ophthalmology/optometry visit for glaucoma screening and checkup Recommended yearly dental visit for hygiene and checkup  Vaccinations: Influenza vaccine: due Fall 2023 Pneumococcal vaccine: 08/12/2013, 01/31/2015 Tdap vaccine: 11/26/2010; due every 10 years (overdue) Shingles vaccine: due   Covid-19: 07/26/2019, 08/23/2019, 05/18/2020  Advanced directives: Yes; Please bring a copy of your health care power of attorney and living will to the office at your convenience.  Conditions/risks identified: Yes;Type II Diabetes Mellitus  Next appointment: 03/27/2023 at 3:30 p.m. telephone visit with Mignon Pine, Nurse Health Advisor.  If you need to reschedule or cancel, please call [phone].   Preventive Care 30 Years and Older, Female Preventive care refers to lifestyle choices and visits with your health care provider that can promote health and wellness. What does preventive care include? A yearly physical exam. This is also called an annual well check. Dental exams once or twice a year. Routine eye exams. Ask your health care provider how often you should have your eyes checked. Personal lifestyle choices, including: Daily care of your teeth and gums. Regular physical activity. Eating a healthy diet. Avoiding tobacco and drug use. Limiting alcohol use. Practicing safe sex. Taking low-dose aspirin every day. Taking vitamin and mineral supplements as recommended by your health care provider. What happens during an annual well check? The services and screenings done by your health care provider during your annual well  check will depend on your age, overall health, lifestyle risk factors, and family history of disease. Counseling  Your health care provider may ask you questions about your: Alcohol use. Tobacco use. Drug use. Emotional well-being. Home and relationship well-being. Sexual activity. Eating habits. History of falls. Memory and ability to understand (cognition). Work and work Statistician. Reproductive health. Screening  You may have the following tests or measurements: Height, weight, and BMI. Blood pressure. Lipid and cholesterol levels. These may be checked every 5 years, or more frequently if you are over 80 years old. Skin check. Lung cancer screening. You may have this screening every year starting at age 46 if you have a 30-pack-year history of smoking and currently smoke or have quit within the past 15 years. Fecal occult blood test (FOBT) of the stool. You may have this test every year starting at age 38. Flexible sigmoidoscopy or colonoscopy. You may have a sigmoidoscopy every 5 years or a colonoscopy every 10 years starting at age 37. Hepatitis C blood test. Hepatitis B blood test. Sexually transmitted disease (STD) testing. Diabetes screening. This is done by checking your blood sugar (glucose) after you have not eaten for a while (fasting). You may have this done every 1-3 years. Bone density scan. This is done to screen for osteoporosis. You may have this done starting at age 60. Mammogram. This may be done every 1-2 years. Talk to your health care provider about how often you should have regular mammograms. Talk with your health care provider about your test results, treatment options, and if necessary, the need for more tests. Vaccines  Your health care provider may recommend certain vaccines, such as: Influenza vaccine. This is recommended every year. Tetanus, diphtheria, and acellular pertussis (Tdap, Td) vaccine. You may need a  Td booster every 10 years. Zoster  vaccine. You may need this after age 46. Pneumococcal 13-valent conjugate (PCV13) vaccine. One dose is recommended after age 82. Pneumococcal polysaccharide (PPSV23) vaccine. One dose is recommended after age 17. Talk to your health care provider about which screenings and vaccines you need and how often you need them. This information is not intended to replace advice given to you by your health care provider. Make sure you discuss any questions you have with your health care provider. Document Released: 07/28/2015 Document Revised: 03/20/2016 Document Reviewed: 05/02/2015 Elsevier Interactive Patient Education  2017 [employer] Prevention in the Home Falls can cause injuries. They can happen to people of all ages. There are many things you can do to make your home safe and to help prevent falls. What can I do on the outside of my home? Regularly fix the edges of walkways and driveways and fix any cracks. Remove anything that might make you trip as you walk through a door, such as a raised step or threshold. Trim any bushes or trees on the path to your home. Use bright outdoor lighting. Clear any walking paths of anything that might make someone trip, such as rocks or tools. Regularly check to see if handrails are loose or broken. Make sure that both sides of any steps have handrails. Any raised decks and porches should have guardrails on the edges. Have any leaves, snow, or ice cleared regularly. Use sand or salt on walking paths during winter. Clean up any spills in your garage right away. This includes oil or grease spills. What can I do in the bathroom? Use night lights. Install grab bars by the toilet and in the tub and shower. Do not use towel bars as grab bars. Use non-skid mats or decals in the tub or shower. If you need to sit down in the shower, use a plastic, non-slip stool. Keep the floor dry. Clean up any water that spills on the floor as soon as it happens. Remove  soap buildup in the tub or shower regularly. Attach bath mats securely with double-sided non-slip rug tape. Do not have throw rugs and other things on the floor that can make you trip. What can I do in the bedroom? Use night lights. Make sure that you have a light by your bed that is easy to reach. Do not use any sheets or blankets that are too big for your bed. They should not hang down onto the floor. Have a firm chair that has side arms. You can use this for support while you get dressed. Do not have throw rugs and other things on the floor that can make you trip. What can I do in the kitchen? Clean up any spills right away. Avoid walking on wet floors. Keep items that you use a lot in easy-to-reach places. If you need to reach something above you, use a strong step stool that has a grab bar. Keep electrical cords out of the way. Do not use floor polish or wax that makes floors slippery. If you must use wax, use non-skid floor wax. Do not have throw rugs and other things on the floor that can make you trip. What can I do with my stairs? Do not leave any items on the stairs. Make sure that there are handrails on both sides of the stairs and use them. Fix handrails that are broken or loose. Make sure that handrails are as long as the stairways. Check any  carpeting to make sure that it is firmly attached to the stairs. Fix any carpet that is loose or worn. Avoid having throw rugs at the top or bottom of the stairs. If you do have throw rugs, attach them to the floor with carpet tape. Make sure that you have a light switch at the top of the stairs and the bottom of the stairs. If you do not have them, ask someone to add them for you. What else can I do to help prevent falls? Wear shoes that: Do not have high heels. Have rubber bottoms. Are comfortable and fit you well. Are closed at the toe. Do not wear sandals. If you use a stepladder: Make sure that it is fully opened. Do not climb a  closed stepladder. Make sure that both sides of the stepladder are locked into place. Ask someone to hold it for you, if possible. Clearly mark and make sure that you can see: Any grab bars or handrails. First and last steps. Where the edge of each step is. Use tools that help you move around (mobility aids) if they are needed. These include: Canes. Walkers. Scooters. Crutches. Turn on the lights when you go into a dark area. Replace any light bulbs as soon as they burn out. Set up your furniture so you have a clear path. Avoid moving your furniture around. If any of your floors are uneven, fix them. If there are any pets around you, be aware of where they are. Review your medicines with your doctor. Some medicines can make you feel dizzy. This can increase your chance of falling. Ask your doctor what other things that you can do to help prevent falls. This information is not intended to replace advice given to you by your health care provider. Make sure you discuss any questions you have with your health care provider. Document Released: 04/27/2009 Document Revised: 12/07/2015 Document Reviewed: 08/05/2014 Elsevier Interactive Patient Education  2017 [employer].

## 2022-03-25 NOTE — Progress Notes (Signed)
Subjective:   [name] is a 87 y.o. female who presents for Medicare Annual (Subsequent) preventive examination.  Review of Systems    Virtual Visit via Telephone Note  I connected with  Lesage on 03/25/22 at  3:15 PM EDT by telephone and verified that I am speaking with the correct person using two identifiers.  Location: Patient: Home Provider: [place] Persons participating in the virtual visit: [place]   I discussed the limitations, risks, security and privacy concerns of performing an evaluation and management service by telephone and the availability of in person appointments. The patient expressed understanding and agreed to proceed.  Interactive audio and video telecommunications were attempted between this nurse and patient, however failed, due to patient having technical difficulties OR patient did not have access to video capability.  We continued and completed visit with audio only.  Some vital signs may be absent or patient reported.   Sheral Flow, LPN  Cardiac Risk Factors include: advanced age (>73mn, >>54women)     Objective:    Today's Vitals   03/25/22 1518  Weight: 233 lb (105.7 kg)  Height: '5\' 7"'$  (1.702 m)  PainSc: 0-No pain   Body mass index is 36.49 kg/m.     03/25/2022    3:20 PM 10/03/2017    9:58 AM  Advanced Directives  Does Patient Have a Medical Advance Directive? Yes No  Type of [place]   Copy of [place]? No - copy requested   Would patient like information on creating a medical advance directive?  No - Patient declined    Current Medications (verified) Outpatient Encounter Medications as of 03/25/2022  Medication Sig   albuterol (VENTOLIN HFA) 108 (90 Base) MCG/ACT inhaler INHALE 2 PUFFS BY MOUTH EVERY 6 HOURS AS NEEDED FOR WHEEZING OR SHORTNESS OF BREATH   Alum Hydroxide-Mag Carbonate (GAVISCON PO) Take 1 Dose  by mouth as directed.   amLODipine (NORVASC) 5 MG tablet TAKE 1 TABLET(5 MG) BY MOUTH DAILY   aspirin 81 MG tablet Take 81 mg by mouth daily.   Aspirin-Acetaminophen-Caffeine (EXCEDRIN PO) Take 1 Dose by mouth as directed.   Cholecalciferol (VITAMIN D3) 50 MCG (2000 UT) capsule Take 1 capsule (2,000 Units total) by mouth daily.   fluticasone (FLONASE) 50 MCG/ACT nasal spray SHAKE LIQUID AND USE 2 SPRAYS IN EACH NOSTRIL DAILY   furosemide (LASIX) 40 MG tablet TAKE 1 TABLET(40 MG) BY MOUTH DAILY   loratadine (CLARITIN) 10 MG tablet Take 1 tablet (10 mg total) by mouth daily.   LORazepam (ATIVAN) 0.5 MG tablet TAKE 1 TABLET(0.5 MG) BY MOUTH DAILY AS NEEDED FOR ANXIETY   losartan (COZAAR) 100 MG tablet Take 1 tablet (100 mg total) by mouth daily.   rosuvastatin (CRESTOR) 10 MG tablet TAKE 1 TABLET(10 MG) BY MOUTH DAILY   RYBELSUS 3 MG TABS TAKE 1 TABLET BY MOUTH DAILY 30 MINUTES BEFORE BREAKFAST   sertraline (ZOLOFT) 50 MG tablet TAKE 1 TABLET(50 MG) BY MOUTH DAILY   No facility-administered encounter medications on file as of 03/25/2022.    Allergies (verified) Latex and Semaglutide   History: Past Medical History:  Diagnosis Date   Allergy    Anemia    Asthma    adult onset   Carotid bruit    left   Hyperlipidemia    LDL goal = < 120 based on NMR Lipoprofile 2005   Hypertension    Metabolic  syndrome    Past Surgical History:  Procedure Laterality Date   CARDIAC CATHETERIZATION  2006   negative   CATARACT EXTRACTION, BILATERAL     Dr Bing Plume   COLONOSCOPY  06/02/2006   Dr.Kaplan,Next Due date 06/02/2016   SHOULDER SURGERY  11/21/2008   Dr.Aplington   TOTAL ABDOMINAL HYSTERECTOMY W/ BILATERAL SALPINGOOPHORECTOMY  1983   Dr.Fore, for fibroids with pain   TOTAL HIP ARTHROPLASTY  2005, 2006   Family History  Problem Relation Age of Onset   Hypertension Father    Stroke Father        > 55   Hypertension Mother    Arthritis Mother    Hypertension Sister    Diabetes Paternal  Grandmother    Heart attack Maternal Grandfather        age unknown; ? > 61   Stomach cancer Maternal Grandmother    Social History   Socioeconomic History   Marital status: Widowed    Spouse name: Not on file   Number of children: Not on file   Years of education: Not on file   Highest education level: Not on file  Occupational History   Not on file  Tobacco Use   Smoking status: Never   Smokeless tobacco: Never  Substance and Sexual Activity   Alcohol use: No   Drug use: No   Sexual activity: Not on file  Other Topics Concern   Not on file  Social History Narrative   Right handed   Social Determinants of Health   Financial Resource Strain: Low Risk  (03/25/2022)   Overall Financial Resource Strain (CARDIA)    Difficulty of Paying Living Expenses: Not hard at all  Food Insecurity: No Food Insecurity (03/25/2022)   Hunger Vital Sign    Worried About Running Out of Food in the Last Year: Never true    Ran Out of Food in the Last Year: Never true  Transportation Needs: No Transportation Needs (03/25/2022)   PRAPARE - Hydrologist (Medical): No    Lack of Transportation (Non-Medical): No  Physical Activity: Inactive (03/25/2022)   Exercise Vital Sign    Days of Exercise per Week: 0 days    Minutes of Exercise per Session: 0 min  Stress: No Stress Concern Present (03/25/2022)   [place] of Stress : Not at all  Social Connections: [place] (03/25/2022)   Social Connection and Isolation Panel [NHANES]    Frequency of Communication with Friends and Family: More than three times a week    Frequency of Social Gatherings with Friends and Family: More than three times a week    Attends Religious Services: More than 4 times per year    Active Member of [employer] or Organizations: Yes    Attends Music therapist: More than 4 times per year    Marital  Status: Married    Tobacco Counseling Counseling given: Not Answered   Clinical Intake:  Pre-visit preparation completed: Yes  Pain : No/denies pain Pain Score: 0-No pain     BMI - recorded: 36.49 Nutritional Status: BMI > 30  Obese Nutritional Risks: None Diabetes: Yes CBG done?: No Did pt. bring in CBG monitor from home?: No  How often do you need to have someone help you when you read instructions, pamphlets, or other written materials from your doctor or pharmacy?: 1 - Never What is the last grade level you  completed in school?: 2 Master's Degrees  Nutrition Risk Assessment:  Has the patient had any N/V/D within the last 2 months?  No  Does the patient have any non-healing wounds?  No  Has the patient had any unintentional weight loss or weight gain?  No   Diabetes:  Is the patient diabetic?  Yes  If diabetic, was a CBG obtained today?  No  Did the patient bring in their glucometer from home?  No  How often do you monitor your CBG's? 0.   Financial Strains and Diabetes Management:  Are you having any financial strains with the device, your supplies or your medication? No .  Does the patient want to be seen by Chronic Care Management for management of their diabetes?  No  Would the patient like to be referred to a Nutritionist or for Diabetic Management?  No   Diabetic Exams:  Diabetic Eye Exam: Completed: NO. Overdue for diabetic eye exam. Pt has been advised about the importance in completing this exam.  Diabetic Foot Exam: Completed: NO. Pt has been advised about the importance in completing this exam.   Interpreter Needed?: No  Information entered by :: Lisette Abu, LPN.   Activities of Daily Living    03/25/2022    3:26 PM  In your present state of health, do you have any difficulty performing the following activities:  Hearing? 0  Vision? 0  Difficulty concentrating or making decisions? 0  Walking or climbing stairs? 0  Dressing or bathing?  0  Doing errands, shopping? 0  Preparing Food and eating ? N  Using the Toilet? N  In the past six months, have you accidently leaked urine? Y  Comment Poise Pads  Do you have problems with loss of bowel control? N  Managing your Medications? N  Managing your Finances? N  Housekeeping or managing your Housekeeping? N    Patient Care Team: Binnie Rail, MD as PCP - General (Internal Medicine) Jerline Pain, MD as Consulting Physician (Cardiology) Nicholes Stairs, MD as Consulting Physician (Orthopedic Surgery) Calvert Cantor, MD as Attending Physician (Ophthalmology)  Indicate any recent Medical Services you may have received from other than [facility] providers in the past year (date may be approximate).     Assessment:   This is a routine wellness examination for [name].  Hearing/Vision screen Hearing Screening - Comments:: Denies hearing difficulties   Vision Screening - Comments:: Wears rx glasses - up to date with routine eye exams with Calvert Cantor, MD.   Dietary issues and exercise activities discussed: Current Exercise Habits: The patient does not participate in regular exercise at present, Exercise limited by: orthopedic condition(s)   Goals Addressed             This Visit's Progress    My goal is to wake up every morning, continue to eat healthy and stay independent.        Depression Screen    03/25/2022    3:25 PM 12/31/2021    1:06 PM 10/12/2021   10:49 AM 09/29/2020    4:01 PM 07/05/2019    9:49 AM 12/21/2018   10:30 AM 07/03/2018    4:43 PM  PHQ 2/9 Scores  PHQ - 2 Score 0 1 0 0 0 0 1  PHQ- 9 Score  '7   2 4 4    '$ Fall Risk    03/25/2022    3:22 PM 12/31/2021    1:06 PM 10/12/2021   10:49 AM 09/29/2020  3:55 PM 07/05/2019    9:49 AM  Fall Risk   Falls in the past year? 0 0 0 0 0  Number falls in past yr: 0  0 0 0  Injury with Fall? 0  0 0   Risk for fall due to : No Fall Risks  No Fall Risks No Fall Risks   Follow up Falls prevention  discussed  Falls evaluation completed Falls evaluation completed     [place]:  Any stairs in or around the home? No  If so, are there any without handrails? No  Home free of loose throw rugs in walkways, pet beds, electrical cords, etc? Yes  Adequate lighting in your home to reduce risk of falls? Yes   ASSISTIVE DEVICES UTILIZED TO PREVENT FALLS:  Life alert? No  Use of a cane, walker or w/c? No  Grab bars in the bathroom? No  Shower chair or bench in shower? Yes  Elevated toilet seat or a handicapped toilet? No   TIMED UP AND GO:  Was the test performed? No .  Length of time to ambulate 10 feet: n/a sec.   Appearance of gait: Gait not evaluated during this visit.  Cognitive Function:    10/03/2017   10:00 AM  MMSE - Mini Mental State Exam  Orientation to time 5  Orientation to Place 5  Registration 3  Attention/ Calculation 5  Recall 2  Language- name 2 objects 2  Language- repeat 1  Language- follow 3 step command 3  Language- read & follow direction 1  Write a sentence 1  Copy design 1  Total score 29        03/25/2022    3:33 PM  6CIT Screen  What Year? 0 points  What month? 0 points  What time? 0 points  Count back from 20 0 points  Months in reverse 0 points  Repeat phrase 0 points  Total Score 0 points    Immunizations Immunization History  Administered Date(s) Administered   Fluad Quad(high Dose 65+) 03/26/2019, 04/03/2020, 04/02/2021   Influenza Split 04/03/2012   Influenza Whole 04/29/2006, 05/27/2007, 04/11/2008   Influenza, High Dose Seasonal PF 04/04/2017, 04/08/2018   Influenza,inj,Quad PF,6+ Mos 04/06/2013, 04/12/2014   Influenza-Unspecified 04/06/2015, 03/18/2016   Moderna Sars-Covid-2 Vaccination 07/26/2019, 08/23/2019, 05/18/2020   Pneumococcal Conjugate-13 01/31/2015   Pneumococcal Polysaccharide-23 07/16/1995, 08/12/2013   Td 07/17/1992   Tdap 11/26/2010   Zoster, Live 06/03/2012    TDAP  status: Due, Education has been provided regarding the importance of this vaccine. Advised may receive this vaccine at local pharmacy or Health Dept. Aware to provide a copy of the vaccination record if obtained from local pharmacy or Health Dept. Verbalized acceptance and understanding.  Flu Vaccine status: Due, Education has been provided regarding the importance of this vaccine. Advised may receive this vaccine at local pharmacy or Health Dept. Aware to provide a copy of the vaccination record if obtained from local pharmacy or Health Dept. Verbalized acceptance and understanding.  Pneumococcal vaccine status: Up to date  Covid-19 vaccine status: Completed vaccines  Qualifies for Shingles Vaccine? Yes   Zostavax completed Yes   Shingrix Completed?: No.    Education has been provided regarding the importance of this vaccine. Patient has been advised to call insurance company to determine out of pocket expense if they have not yet received this vaccine. Advised may also receive vaccine at local pharmacy or Health Dept. Verbalized acceptance and understanding.  Screening  Tests Health Maintenance  Topic Date Due   FOOT EXAM  Never done   OPHTHALMOLOGY EXAM  Never done   COVID-19 Vaccine (4 - Moderna series) 07/13/2020   INFLUENZA VACCINE  02/12/2022   Zoster Vaccines- Shingrix (1 of 2) 04/02/2022 (Originally 07/29/1986)   DEXA SCAN  01/01/2023 (Originally 07/29/2001)   TETANUS/TDAP  01/01/2023 (Originally 11/25/2020)   HEMOGLOBIN A1C  06/21/2022   Diabetic kidney evaluation - GFR measurement  12/21/2022   Diabetic kidney evaluation - Urine ACR  12/21/2022   Pneumonia Vaccine 27+ Years old  Completed   HPV VACCINES  Aged Out    Health Maintenance  Health Maintenance Due  Topic Date Due   FOOT EXAM  Never done   OPHTHALMOLOGY EXAM  Never done   COVID-19 Vaccine (4 - Moderna series) 07/13/2020   INFLUENZA VACCINE  02/12/2022    Colorectal cancer screening: No longer required.    Mammogram status: Completed 02/23/2021. Repeat every year  Bone Density status: never done  Lung Cancer Screening: (Low Dose CT Chest recommended if Age 85-80 years, 30 pack-year currently smoking OR have quit w/in 15years.) does not qualify.   Lung Cancer Screening Referral: no  Additional Screening:  Hepatitis C Screening: does not qualify; Completed no  Vision Screening: Recommended annual ophthalmology exams for early detection of glaucoma and other disorders of the eye. Is the patient up to date with their annual eye exam?  No  Who is the provider or what is the name of the office in which the patient attends annual eye exams? [facility] If pt is not established with a provider, would they like to be referred to a provider to establish care? No .   Dental Screening: Recommended annual dental exams for proper oral hygiene  Community Resource Referral / Chronic Care Management: CRR required this visit?  No   CCM required this visit?  No      Plan:     I have personally reviewed and noted the following in the patient's chart:   Medical and social history Use of alcohol, tobacco or illicit drugs  Current medications and supplements including opioid prescriptions. Patient is not currently taking opioid prescriptions. Functional ability and status Nutritional status Physical activity Advanced directives List of other physicians Hospitalizations, surgeries, and ER visits in previous 12 months Vitals Screenings to include cognitive, depression, and falls Referrals and appointments  In addition, I have reviewed and discussed with patient certain preventive protocols, quality metrics, and best practice recommendations. A written personalized care plan for preventive services as well as general preventive health recommendations were provided to patient.     Sheral Flow, LPN   3/41/9622   Nurse Notes:  Patient is cogitatively intact. There were no vitals filed  for this visit. There is no height or weight on file to calculate BMI. Patient stated that she has no issues with gait or balance; does not use any assistive devices.

## 2022-03-26 ENCOUNTER — Other Ambulatory Visit: Payer: Self-pay | Admitting: Internal Medicine

## 2022-04-03 ENCOUNTER — Other Ambulatory Visit: Payer: Self-pay | Admitting: Internal Medicine

## 2022-04-03 DIAGNOSIS — Z1231 Encounter for screening mammogram for malignant neoplasm of breast: Secondary | ICD-10-CM

## 2022-04-23 ENCOUNTER — Other Ambulatory Visit: Payer: Self-pay | Admitting: Internal Medicine

## 2022-04-24 ENCOUNTER — Other Ambulatory Visit: Payer: Self-pay | Admitting: Internal Medicine

## 2022-05-01 ENCOUNTER — Ambulatory Visit
Admission: RE | Admit: 2022-05-01 | Discharge: 2022-05-01 | Disposition: A | Discharge status: Home / Self Care | Payer: Medicare PPO | Source: Ambulatory Visit | Attending: Internal Medicine | Admitting: Internal Medicine

## 2022-05-01 DIAGNOSIS — Z1231 Encounter for screening mammogram for malignant neoplasm of breast: Secondary | ICD-10-CM

## 2022-05-22 ENCOUNTER — Other Ambulatory Visit: Payer: Self-pay | Admitting: Internal Medicine

## 2022-06-13 ENCOUNTER — Ambulatory Visit (INDEPENDENT_AMBULATORY_CARE_PROVIDER_SITE_OTHER): Payer: Medicare PPO | Admitting: Nurse Practitioner

## 2022-06-13 VITALS — BP 142/80 | HR 80 | Temp 98.2°F | Ht 67.0 in | Wt 238.5 lb

## 2022-06-13 DIAGNOSIS — R059 Cough, unspecified: Secondary | ICD-10-CM

## 2022-06-13 LAB — POCT INFLUENZA A/B
Influenza A, POC: NEGATIVE
Influenza B, POC: NEGATIVE

## 2022-06-13 LAB — POCT RESPIRATORY SYNCYTIAL VIRUS: RSV Rapid Ag: NEGATIVE

## 2022-06-13 LAB — POC COVID19 BINAXNOW: SARS Coronavirus 2 Ag: NEGATIVE

## 2022-06-13 LAB — POCT RAPID STREP A (OFFICE): Rapid Strep A Screen: NEGATIVE

## 2022-06-13 MED ORDER — PREDNISONE 20 MG PO TABS: 20.0000 mg | ORAL_TABLET | Freq: Every day | ORAL | 0 refills | Status: DC

## 2022-06-13 MED ORDER — AZITHROMYCIN 250 MG PO TABS: ORAL_TABLET | ORAL | 0 refills | Status: AC

## 2022-06-13 NOTE — Progress Notes (Signed)
Established Patient Office Visit  Subjective   Patient ID: [name], female    DOB: May 06, 1935  Age: 87 y.o. MRN: [id number]  Chief Complaint  Patient presents with   Cough    Symptom onset 2 days ago. Has asthma and concerned about developing bronchitis/pneumonia. Has not tested herself at home for [place]. Reports up to date with flu and covid booster. Has not had RSV vaccine. Has completed pneumonia vaccinations. No known sick contacts.     Review of Systems  Constitutional:  Negative for chills and fever.  HENT:  Positive for sore throat.   Respiratory:  Positive for cough, sputum production, shortness of breath and wheezing.   Cardiovascular:  Negative for chest pain.  Gastrointestinal:  Negative for diarrhea, nausea and vomiting.  Musculoskeletal:  Negative for myalgias.  Neurological:  Positive for headaches.      Objective:     BP (!) 142/80   Pulse 80   Temp 98.2 F (36.8 C) (Oral)   Ht '5\' 7"'$  (1.702 m)   Wt 238 lb 8 oz (108.2 kg)   SpO2 94%   BMI 37.35 kg/m  BP Readings from Last 3 Encounters:  06/13/22 (!) 142/80  02/25/22 118/60  02/19/22 132/66   Wt Readings from Last 3 Encounters:  06/13/22 238 lb 8 oz (108.2 kg)  03/25/22 233 lb (105.7 kg)  02/25/22 233 lb (105.7 kg)      Physical Exam Vitals reviewed.  Constitutional:      General: She is not in acute distress.    Appearance: Normal appearance.  HENT:     Head: Normocephalic and atraumatic.  Neck:     Vascular: No carotid bruit.  Cardiovascular:     Rate and Rhythm: Normal rate and regular rhythm.     Pulses: Normal pulses.     Heart sounds: Normal heart sounds.  Pulmonary:     Effort: Pulmonary effort is normal.     Breath sounds: Normal breath sounds.  Skin:    General: Skin is warm and dry.  Neurological:     General: No focal deficit present.     Mental Status: She is alert and oriented to person, place, and time.  Psychiatric:        Mood and Affect: Mood normal.         Behavior: Behavior normal.        Judgment: Judgment normal.      Results for orders placed or performed in visit on 06/13/22  POCT Influenza A/B  Result Value Ref Range   Influenza A, POC Negative Negative   Influenza B, POC Negative Negative  POCT rapid strep A  Result Value Ref Range   Rapid Strep A Screen Negative Negative  POC COVID-19  Result Value Ref Range   SARS Coronavirus 2 Ag Negative Negative  POCT respiratory syncytial virus  Result Value Ref Range   RSV Rapid Ag negative       The ASCVD Risk score (Arnett DK, et al., 2019) failed to calculate for the following reasons:   The 2019 ASCVD risk score is only valid for ages 72 to 18    Assessment & Plan:   Problem List Items Addressed This Visit       Other   Cough - Primary    Acute, most likely viral in origin.  No signs of pneumonia on exam, point-of-care testing for COVID-19, RSV, flu, and strep are all negative.  For now recommend symptomatic management to start.  If symptoms persist or worsen over 7 to 10 days would recommend trialing antibiotic therapy.  Patient provided prescription of Z-Pak but educated to not use unless symptoms worsen or persist, patient reports understanding.  Due to patient's history of asthma will also prescribe 5-day course of prednisone 20 mg mouth daily, recommend against any NSAID use while taking prednisone and only use Tylenol as needed for pain management.  Patient reports understanding.  Patient to follow-up if symptoms persist or worsen despite treatment plan.  Patient reports understanding.      Relevant Medications   azithromycin (ZITHROMAX) 250 MG tablet   predniSONE (DELTASONE) 20 MG tablet   Other Relevant Orders   POCT Influenza A/B (Completed)   POCT rapid strep A (Completed)   POC COVID-19 (Completed)   POCT respiratory syncytial virus (Completed)    No follow-ups on file.    Ailene Ards, NP

## 2022-06-13 NOTE — Patient Instructions (Addendum)
Start the azithromycin (antibiotic) if your symptoms persist past 06/20/22 or if they get worse before then. Start the prednisone if you feel like you are experiencing worsening wheezing. Take 1 tablet by mouth every morning with breakfast. Only take tylenol if you need something for pain. Stop the aspirin while taking the prednisone,  Continue delsym for symptom management. Call office if you feel you are not getting better or if you are getting worse despite the above treatment plan.

## 2022-06-13 NOTE — Assessment & Plan Note (Signed)
Acute, most likely viral in origin.  No signs of pneumonia on exam, point-of-care testing for COVID-19, RSV, flu, and strep are all negative.  For now recommend symptomatic management to start.  If symptoms persist or worsen over 7 to 10 days would recommend trialing antibiotic therapy.  Patient provided prescription of Z-Pak but educated to not use unless symptoms worsen or persist, patient reports understanding.  Due to patient's history of asthma will also prescribe 5-day course of prednisone 20 mg mouth daily, recommend against any NSAID use while taking prednisone and only use Tylenol as needed for pain management.  Patient reports understanding.  Patient to follow-up if symptoms persist or worsen despite treatment plan.  Patient reports understanding.

## 2022-06-14 ENCOUNTER — Other Ambulatory Visit: Payer: Self-pay | Admitting: Internal Medicine

## 2022-07-01 NOTE — Patient Instructions (Addendum)
      Blood work was ordered.   The lab is on the first floor.    Medications changes include :   none     Return in about 6 months (around 01/01/2023) for follow up.

## 2022-07-01 NOTE — Progress Notes (Unsigned)
Subjective:    Patient ID: [name], female    DOB: 02/14/1935, 87 y.o.   MRN: [id number]     HPI Aika is here for follow up of her chronic medical problems, including DM, htn, hld, CKD, anemia of chronic disease, anxiety, depression, asthma, chronic knee pain from OA  Has had some right head pains yesterday- right forehead down to jaw.  It lasted a few hours - it is still a little sore.  The pain was at the R TMJ.    Right ear feels like there is water in it.      Medications and allergies reviewed with patient and updated if appropriate.  Current Outpatient Medications on File Prior to Visit  Medication Sig Dispense Refill   albuterol (VENTOLIN HFA) 108 (90 Base) MCG/ACT inhaler INHALE 2 PUFFS BY MOUTH EVERY 6 HOURS AS NEEDED FOR WHEEZING OR SHORTNESS OF BREATH 18 g 5   Alum Hydroxide-Mag Carbonate (GAVISCON PO) Take 1 Dose by mouth as directed.     amLODipine (NORVASC) 5 MG tablet TAKE 1 TABLET(5 MG) BY MOUTH DAILY 90 tablet 1   aspirin 81 MG tablet Take 81 mg by mouth daily.     Aspirin-Acetaminophen-Caffeine (EXCEDRIN PO) Take 1 Dose by mouth as directed.     Cholecalciferol (VITAMIN D3) 50 MCG (2000 UT) capsule Take 1 capsule (2,000 Units total) by mouth daily. 100 capsule 3   fluticasone (FLONASE) 50 MCG/ACT nasal spray SHAKE LIQUID AND USE 2 SPRAYS IN EACH NOSTRIL DAILY 48 g 1   furosemide (LASIX) 40 MG tablet TAKE 1 TABLET(40 MG) BY MOUTH DAILY 90 tablet 1   loratadine (CLARITIN) 10 MG tablet Take 1 tablet (10 mg total) by mouth daily. 30 tablet 11   LORazepam (ATIVAN) 0.5 MG tablet TAKE 1 TABLET(0.5 MG) BY MOUTH DAILY AS NEEDED FOR ANXIETY 30 tablet 5   losartan (COZAAR) 100 MG tablet Take 1 tablet (100 mg total) by mouth daily. 90 tablet 3   rosuvastatin (CRESTOR) 10 MG tablet TAKE 1 TABLET(10 MG) BY MOUTH DAILY 90 tablet 3   RYBELSUS 3 MG TABS TAKE 1 TABLET BY MOUTH DAILY 30 MINUTES BEFORE BREAKFAST 30 tablet 3   sertraline (ZOLOFT) 50 MG tablet TAKE 1  TABLET(50 MG) BY MOUTH DAILY 90 tablet 1   No current facility-administered medications on file prior to visit.     Review of Systems  Constitutional:  Negative for fever.  HENT:  Negative for congestion, ear pain, hearing loss and sinus pain.   Respiratory:  Positive for stridor (chronic - stable). Negative for cough and wheezing.   Cardiovascular:  Negative for chest pain, palpitations and leg swelling.  Neurological:  Negative for light-headedness and headaches.       Objective:   Vitals:   07/02/22 1134  BP: 128/68  Pulse: 80  Temp: 98 F (36.7 C)  SpO2: 98%   BP Readings from Last 3 Encounters:  07/02/22 128/68  06/13/22 (!) 142/80  02/25/22 118/60   Wt Readings from Last 3 Encounters:  07/02/22 232 lb (105.2 kg)  06/13/22 238 lb 8 oz (108.2 kg)  03/25/22 233 lb (105.7 kg)   Body mass index is 36.34 kg/m.    Physical Exam Constitutional:      General: She is not in acute distress.    Appearance: Normal appearance.  HENT:     Head: Normocephalic and atraumatic.     Comments: No tenderness with palpation of right side of head/TMJ  Right Ear: Tympanic membrane, ear canal and external ear normal. There is no impacted cerumen.     Left Ear: Tympanic membrane, ear canal and external ear normal. There is no impacted cerumen.  Eyes:     Conjunctiva/sclera: Conjunctivae normal.  Cardiovascular:     Rate and Rhythm: Normal rate and regular rhythm.     Heart sounds: Normal heart sounds. No murmur heard. Pulmonary:     Effort: Pulmonary effort is normal. No respiratory distress.     Breath sounds: Normal breath sounds. No wheezing.  Musculoskeletal:     Cervical back: Neck supple.     Right lower leg: No edema.     Left lower leg: No edema.  Lymphadenopathy:     Cervical: No cervical adenopathy.  Skin:    General: Skin is warm and dry.     Findings: No rash.  Neurological:     Mental Status: She is alert. Mental status is at baseline.  Psychiatric:         Mood and Affect: Mood normal.        Behavior: Behavior normal.        Lab Results  Component Value Date   WBC 8.9 12/20/2021   HGB 10.5 (L) 12/20/2021   HCT 32.2 (L) 12/20/2021   PLT 275.0 12/20/2021   GLUCOSE 136 (H) 12/20/2021   CHOL 116 12/20/2021   TRIG 68.0 12/20/2021   HDL 27.70 (L) 12/20/2021   LDLCALC 74 12/20/2021   ALT 10 12/20/2021   AST 13 12/20/2021   NA 135 12/20/2021   K 4.0 12/20/2021   CL 100 12/20/2021   CREATININE 1.25 (H) 12/20/2021   BUN 26 (H) 12/20/2021   CO2 28 12/20/2021   TSH 2.22 01/12/2020   HGBA1C 5.6 12/20/2021   MICROALBUR <0.7 12/20/2021     Assessment & Plan:    See Problem List for Assessment and Plan of chronic medical problems.

## 2022-07-02 ENCOUNTER — Ambulatory Visit (INDEPENDENT_AMBULATORY_CARE_PROVIDER_SITE_OTHER): Payer: Medicare PPO | Admitting: Internal Medicine

## 2022-07-02 ENCOUNTER — Encounter: Payer: Self-pay | Admitting: Internal Medicine

## 2022-07-02 VITALS — BP 128/68 | HR 80 | Temp 98.0°F | Ht 67.0 in | Wt 232.0 lb

## 2022-07-02 DIAGNOSIS — F32A Depression, unspecified: Secondary | ICD-10-CM

## 2022-07-02 DIAGNOSIS — D649 Anemia, unspecified: Secondary | ICD-10-CM | POA: Diagnosis not present

## 2022-07-02 DIAGNOSIS — F419 Anxiety disorder, unspecified: Secondary | ICD-10-CM | POA: Diagnosis not present

## 2022-07-02 DIAGNOSIS — N1831 Chronic kidney disease, stage 3a: Secondary | ICD-10-CM

## 2022-07-02 DIAGNOSIS — M25562 Pain in left knee: Secondary | ICD-10-CM

## 2022-07-02 DIAGNOSIS — G8929 Other chronic pain: Secondary | ICD-10-CM

## 2022-07-02 DIAGNOSIS — M25561 Pain in right knee: Secondary | ICD-10-CM | POA: Diagnosis not present

## 2022-07-02 DIAGNOSIS — J4520 Mild intermittent asthma, uncomplicated: Secondary | ICD-10-CM

## 2022-07-02 DIAGNOSIS — I10 Essential (primary) hypertension: Secondary | ICD-10-CM | POA: Diagnosis not present

## 2022-07-02 DIAGNOSIS — R519 Headache, unspecified: Secondary | ICD-10-CM

## 2022-07-02 DIAGNOSIS — E1122 Type 2 diabetes mellitus with diabetic chronic kidney disease: Secondary | ICD-10-CM

## 2022-07-02 DIAGNOSIS — E7849 Other hyperlipidemia: Secondary | ICD-10-CM

## 2022-07-02 LAB — COMPREHENSIVE METABOLIC PANEL
ALT: 10 U/L (ref 0–35)
AST: 13 U/L (ref 0–37)
Albumin: 4.1 g/dL (ref 3.5–5.2)
Alkaline Phosphatase: 107 U/L (ref 39–117)
BUN: 19 mg/dL (ref 6–23)
CO2: 31 mEq/L (ref 19–32)
Calcium: 10 mg/dL (ref 8.4–10.5)
Chloride: 100 mEq/L (ref 96–112)
Creatinine, Ser: 1.22 mg/dL — ABNORMAL HIGH (ref 0.40–1.20)
GFR: 40.35 mL/min — ABNORMAL LOW (ref >60.00)
Glucose, Bld: 105 mg/dL — ABNORMAL HIGH (ref 70–99)
Potassium: 4.1 mEq/L (ref 3.5–5.1)
Sodium: 136 mEq/L (ref 135–145)
Total Bilirubin: 1 mg/dL (ref 0.2–1.2)
Total Protein: 7.7 g/dL (ref 6.0–8.3)

## 2022-07-02 LAB — CBC WITH DIFFERENTIAL/PLATELET
Basophils Absolute: 0.1 10*3/uL (ref 0.0–0.1)
Basophils Relative: 0.9 % (ref 0.0–3.0)
Eosinophils Absolute: 0.2 10*3/uL (ref 0.0–0.7)
Eosinophils Relative: 1.8 % (ref 0.0–5.0)
HCT: 34.5 % — ABNORMAL LOW (ref 36.0–46.0)
Hemoglobin: 11.7 g/dL — ABNORMAL LOW (ref 12.0–15.0)
Lymphocytes Relative: 16.6 % (ref 12.0–46.0)
Lymphs Abs: 1.5 10*3/uL (ref 0.7–4.0)
MCHC: 33.9 g/dL (ref 30.0–36.0)
MCV: 94.8 fl (ref 78.0–100.0)
Monocytes Absolute: 0.8 10*3/uL (ref 0.1–1.0)
Monocytes Relative: 8.4 % (ref 3.0–12.0)
Neutro Abs: 6.5 10*3/uL (ref 1.4–7.7)
Neutrophils Relative %: 72.3 % (ref 43.0–77.0)
Platelets: 291 10*3/uL (ref 150.0–400.0)
RBC: 3.64 Mil/uL — ABNORMAL LOW (ref 3.87–5.11)
RDW: 12.2 % (ref 11.5–15.5)
WBC: 9 10*3/uL (ref 4.0–10.5)

## 2022-07-02 LAB — LIPID PANEL
Cholesterol: 123 mg/dL (ref 0–200)
HDL: 30 mg/dL — ABNORMAL LOW (ref >39.00)
LDL Cholesterol: 78 mg/dL (ref 0–99)
NonHDL: 92.58
Total CHOL/HDL Ratio: 4
Triglycerides: 74 mg/dL (ref 0.0–149.0)
VLDL: 14.8 mg/dL (ref 0.0–40.0)

## 2022-07-02 LAB — HEMOGLOBIN A1C: Hgb A1c MFr Bld: 5.5 % (ref 4.6–6.5)

## 2022-07-02 NOTE — Assessment & Plan Note (Signed)
Chronic Controlled, Stable Continue sertraline 50 mg daily, lorazepam 0.5 mg daily as needed

## 2022-07-02 NOTE — Assessment & Plan Note (Signed)
Chronic Regular exercise and healthy diet encouraged Check lipid panel  Continue Crestor 10 mg daily 

## 2022-07-02 NOTE — Assessment & Plan Note (Signed)
Chronic Blood pressure well controlled CMP Continue amlodipine 5 mg daily, losartan 100 mg daily

## 2022-07-02 NOTE — Assessment & Plan Note (Signed)
Chronic Mild, intermittent Controlled Continue albuterol inhaler as needed

## 2022-07-02 NOTE — Assessment & Plan Note (Signed)
Chronic GFR has declined over the past years CMP

## 2022-07-02 NOTE — Assessment & Plan Note (Signed)
Chronic   Lab Results  Component Value Date   HGBA1C 5.6 12/20/2021   Sugars very well controlled Check A1c  Continue lifestyle control Stressed regular exercise, diabetic diet

## 2022-07-02 NOTE — Assessment & Plan Note (Signed)
Chronic Normocytic-likely anemia of chronic disease CBC

## 2022-07-02 NOTE — Assessment & Plan Note (Signed)
Acute States an episode of right-sided head pain-not a headache.  Main focus was at right TMJ and pain included right temple, forehead down to the jawline area.  It lasted a few hours and is still little sore.  She never had pain like this before.  At 1 point she had a little pain just anterior to the ear, but nothing like this.  She did take some Excedrin and that did not improve the pain.-No tenderness on exam, ear exam normal. Likely nerve related-facial nerve Since pain has resolved we will just monitor for now-she will let me know if this recurs

## 2022-07-02 NOTE — Assessment & Plan Note (Signed)
Chronic Follows with orthopedics Does not want surgery-uses a cane to ambulate Has had injections in the past Was taking NSAIDs at 1 point which may have contributed to CKD-no longer taking ibuprofen Taking Tylenol, Voltaren gel

## 2022-07-11 ENCOUNTER — Other Ambulatory Visit: Payer: Self-pay | Admitting: Internal Medicine

## 2022-07-19 ENCOUNTER — Ambulatory Visit (INDEPENDENT_AMBULATORY_CARE_PROVIDER_SITE_OTHER): Payer: Medicare PPO

## 2022-07-19 ENCOUNTER — Encounter: Payer: Self-pay | Admitting: Internal Medicine

## 2022-07-19 ENCOUNTER — Ambulatory Visit (INDEPENDENT_AMBULATORY_CARE_PROVIDER_SITE_OTHER): Payer: Medicare PPO | Admitting: Internal Medicine

## 2022-07-19 VITALS — BP 136/80 | HR 76 | Temp 97.5°F | Ht 67.0 in | Wt 238.0 lb

## 2022-07-19 DIAGNOSIS — N1831 Chronic kidney disease, stage 3a: Secondary | ICD-10-CM | POA: Diagnosis not present

## 2022-07-19 DIAGNOSIS — I10 Essential (primary) hypertension: Secondary | ICD-10-CM

## 2022-07-19 DIAGNOSIS — R109 Unspecified abdominal pain: Secondary | ICD-10-CM

## 2022-07-19 DIAGNOSIS — E1122 Type 2 diabetes mellitus with diabetic chronic kidney disease: Secondary | ICD-10-CM | POA: Diagnosis not present

## 2022-07-19 DIAGNOSIS — R079 Chest pain, unspecified: Secondary | ICD-10-CM | POA: Diagnosis not present

## 2022-07-19 LAB — URINALYSIS, ROUTINE W REFLEX MICROSCOPIC
Bilirubin Urine: NEGATIVE
Ketones, ur: NEGATIVE
Leukocytes,Ua: NEGATIVE
Nitrite: NEGATIVE
Specific Gravity, Urine: 1.025 (ref 1.000–1.030)
Total Protein, Urine: NEGATIVE
Urine Glucose: NEGATIVE
Urobilinogen, UA: 0.2 (ref 0.0–1.0)
pH: 6 (ref 5.0–8.0)

## 2022-07-19 MED ORDER — CYCLOBENZAPRINE HCL 5 MG PO TABS: 5.0000 mg | ORAL_TABLET | Freq: Three times a day (TID) | ORAL | 1 refills | Status: AC | PRN

## 2022-07-19 MED ORDER — TRAMADOL HCL 50 MG PO TABS: 50.0000 mg | ORAL_TABLET | Freq: Four times a day (QID) | ORAL | 0 refills | Status: DC | PRN

## 2022-07-19 MED ORDER — LORAZEPAM 0.5 MG PO TABS: ORAL_TABLET | ORAL | 5 refills | Status: DC

## 2022-07-19 NOTE — Progress Notes (Unsigned)
Patient ID: [name], female   DOB: [birth year], 88 y.o.   MRN: [id number]        Chief Complaint: follow up right flank lower back pain, dm, htn, ckd       HPI:  ASHAUNTI [name] is a 88 y.o. female here with c/o 2 days onset mod to severe constant right lower back pain and flank pain, dull and sharp, no radiation to the [place] or gait change or falls but was quite severe yesterday so called for an appt, now pain improved today without specific tx.  No obvious inciting event such as heavy lifting, bending or fall but pain worse to trying to do those things.  Denies urinary symptoms such as dysuria, frequency, urgency, flank pain, hematuria or n/v, fever, chills.  Denies worsening reflux, abd pain, dysphagia, n/v, bowel change or blood.  Pt denies chest pain, increased sob or doe, wheezing, orthopnea, PND, increased LE swelling, palpitations, dizziness or syncope.   Pt denies polydipsia, polyuria, or new focal neuro s/s.        Wt Readings from Last 3 Encounters:  07/19/22 238 lb (108 kg)  07/02/22 232 lb (105.2 kg)  06/13/22 238 lb 8 oz (108.2 kg)   BP Readings from Last 3 Encounters:  07/19/22 136/80  07/02/22 128/68  06/13/22 (!) 142/80         Past Medical History:  Diagnosis Date   Allergy    Anemia    Asthma    adult onset   Carotid bruit    left   Hyperlipidemia    LDL goal = < 120 based on NMR Lipoprofile 2005   Hypertension    Metabolic syndrome    Past Surgical History:  Procedure Laterality Date   CARDIAC CATHETERIZATION  2006   negative   CATARACT EXTRACTION, BILATERAL     Dr Bing Plume   COLONOSCOPY  06/02/2006   Dr.Kaplan,Next Due date 06/02/2016   SHOULDER SURGERY  11/21/2008   Dr.Aplington   TOTAL ABDOMINAL HYSTERECTOMY W/ BILATERAL SALPINGOOPHORECTOMY  1983   Dr.Fore, for fibroids with pain   TOTAL HIP ARTHROPLASTY  2005, 2006    reports that she has never smoked. She has never used smokeless tobacco. She reports that she does not drink alcohol and does not use  drugs. family history includes Arthritis in her mother; Diabetes in her paternal grandmother; Heart attack in her maternal grandfather; Hypertension in her father, mother, and sister; Stomach cancer in her maternal grandmother; Stroke in her father. Allergies  Allergen Reactions   Latex Hives   Current Outpatient Medications on File Prior to Visit  Medication Sig Dispense Refill   albuterol (VENTOLIN HFA) 108 (90 Base) MCG/ACT inhaler INHALE 2 PUFFS BY MOUTH EVERY 6 HOURS AS NEEDED FOR WHEEZING OR SHORTNESS OF BREATH 18 g 5   Alum Hydroxide-Mag Carbonate (GAVISCON PO) Take 1 Dose by mouth as directed.     amLODipine (NORVASC) 5 MG tablet TAKE 1 TABLET(5 MG) BY MOUTH DAILY 90 tablet 1   aspirin 81 MG tablet Take 81 mg by mouth daily.     Aspirin-Acetaminophen-Caffeine (EXCEDRIN PO) Take 1 Dose by mouth as directed.     Cholecalciferol (VITAMIN D3) 50 MCG (2000 UT) capsule Take 1 capsule (2,000 Units total) by mouth daily. 100 capsule 3   fluticasone (FLONASE) 50 MCG/ACT nasal spray SHAKE LIQUID AND USE 2 SPRAYS IN EACH NOSTRIL DAILY 48 g 1   furosemide (LASIX) 40 MG tablet TAKE 1 TABLET(40 MG) BY MOUTH DAILY 90  tablet 1   loratadine (CLARITIN) 10 MG tablet Take 1 tablet (10 mg total) by mouth daily. 30 tablet 11   losartan (COZAAR) 100 MG tablet TAKE 1 TABLET(100 MG) BY MOUTH DAILY 90 tablet 3   rosuvastatin (CRESTOR) 10 MG tablet TAKE 1 TABLET(10 MG) BY MOUTH DAILY 90 tablet 3   RYBELSUS 3 MG TABS TAKE 1 TABLET BY MOUTH DAILY 30 MINUTES BEFORE BREAKFAST 30 tablet 3   sertraline (ZOLOFT) 50 MG tablet TAKE 1 TABLET(50 MG) BY MOUTH DAILY 90 tablet 1   No current facility-administered medications on file prior to visit.        ROS:  All others reviewed and negative.  Objective        PE:  BP 136/80 (BP Location: Left Arm, Patient Position: Sitting, Cuff Size: Large)   Pulse 76   Temp (!) 97.5 F (36.4 C) (Oral)   Ht '5\' 7"'$  (1.702 m)   Wt 238 lb (108 kg)   SpO2 98%   BMI 37.28 kg/m                  Constitutional: Pt appears in NAD               HENT: Head: NCAT.                Right Ear: External ear normal.                 Left Ear: External ear normal.                Eyes: . Pupils are equal, round, and reactive to light. Conjunctivae and EOM are normal               Nose: without d/c or deformity               Neck: Neck supple. Gross normal ROM               Cardiovascular: Normal rate and regular rhythm.                 Pulmonary/Chest: Effort normal and breath sounds without rales or wheezing.                Abd:  Soft, NT, ND, + BS, no organomegaly, right flank and right lumbar paravertebral mod tender, no rash or swelling or bruising               Neurological: Pt is alert. At baseline orientation, motor grossly intact               Skin: Skin is warm. No rashes, no other new lesions, LE edema - none               Psychiatric: Pt behavior is normal without agitation   Micro: none  Cardiac tracings I have personally interpreted today:  none  Pertinent Radiological findings (summarize): none   Lab Results  Component Value Date   WBC 9.0 07/02/2022   HGB 11.7 (L) 07/02/2022   HCT 34.5 (L) 07/02/2022   PLT 291.0 07/02/2022   GLUCOSE 105 (H) 07/02/2022   CHOL 123 07/02/2022   TRIG 74.0 07/02/2022   HDL 30.00 (L) 07/02/2022   LDLCALC 78 07/02/2022   ALT 10 07/02/2022   AST 13 07/02/2022   NA 136 07/02/2022   K 4.1 07/02/2022   CL 100 07/02/2022   CREATININE 1.22 (H) 07/02/2022   BUN 19 07/02/2022   CO2 31 07/02/2022  TSH 2.22 01/12/2020   HGBA1C 5.5 07/02/2022   MICROALBUR <0.7 12/20/2021   Assessment/Plan:  [name] is a 88 y.o. Black or African American [2] female with  has a past medical history of Allergy, Anemia, Asthma, Carotid bruit, Hyperlipidemia, Hypertension, and Metabolic syndrome.  CKD (chronic kidney disease) stage 3, GFR 30-59 ml/min (HCC) Lab Results  Component Value Date   CREATININE 1.22 (H) 07/02/2022   Stable overall,  cont to avoid nephrotoxins   Right flank pain Exam most c/w MSK strain mod to severe, though etiology unclear, fortunately pain improved today, so will check UA, CXR but also tramadol prn, flexeril 5 tid prn,  to f/u any worsening symptoms or concerns  Type 2 diabetes mellitus with stage 3a chronic kidney disease, without long-term current use of insulin (HCC) Lab Results  Component Value Date   HGBA1C 5.5 07/02/2022   Stable, pt to continue current medical treatment rybelsus 3 mg qd  Followup: Return if symptoms worsen or fail to improve.  Cathlean Cower, MD 07/20/2022 5:51 AM [facility]

## 2022-07-19 NOTE — Patient Instructions (Signed)
Please take all new medication as prescribed - the tramadol for pain as needed, and muscle relaxer as needed  Please continue all other medications as before, and refills have been done if requested.  Please have the pharmacy call with any other refills you may need.  Please continue your efforts at being more active, low cholesterol diet, and weight control.  You are otherwise up to date with prevention measures today.  Please keep your appointments with your specialists as you may have planned  Please go to the XRAY Department in the first floor for the x-ray testing  Please go to the LAB at the blood drawing area for the tests to be done  You will be contacted by phone if any changes need to be made immediately.  Otherwise, you will receive a letter about your results with an explanation, but please check with MyChart first.  Please remember to sign up for MyChart if you have not done so, as this will be important to you in the future with finding out test results, communicating by private email, and scheduling acute appointments online when needed.

## 2022-07-20 ENCOUNTER — Encounter: Payer: Self-pay | Admitting: Internal Medicine

## 2022-07-20 NOTE — Assessment & Plan Note (Signed)
Lab Results  Component Value Date   HGBA1C 5.5 07/02/2022   Stable, pt to continue current medical treatment rybelsus 3 mg qd

## 2022-07-20 NOTE — Assessment & Plan Note (Signed)
Lab Results  Component Value Date   CREATININE 1.22 (H) 07/02/2022   Stable overall, cont to avoid nephrotoxins

## 2022-07-20 NOTE — Assessment & Plan Note (Signed)
Exam most c/w MSK strain mod to severe, though etiology unclear, fortunately pain improved today, so will check UA, CXR but also tramadol prn, flexeril 5 tid prn,  to f/u any worsening symptoms or concerns

## 2022-08-26 ENCOUNTER — Other Ambulatory Visit: Payer: Self-pay | Admitting: Internal Medicine

## 2022-10-25 DIAGNOSIS — H26493 Other secondary cataract, bilateral: Secondary | ICD-10-CM | POA: Diagnosis not present

## 2022-10-25 DIAGNOSIS — H5212 Myopia, left eye: Secondary | ICD-10-CM | POA: Diagnosis not present

## 2022-10-25 DIAGNOSIS — H524 Presbyopia: Secondary | ICD-10-CM | POA: Diagnosis not present

## 2022-10-25 DIAGNOSIS — H52223 Regular astigmatism, bilateral: Secondary | ICD-10-CM | POA: Diagnosis not present

## 2022-10-25 DIAGNOSIS — H35033 Hypertensive retinopathy, bilateral: Secondary | ICD-10-CM | POA: Diagnosis not present

## 2022-10-25 DIAGNOSIS — E119 Type 2 diabetes mellitus without complications: Secondary | ICD-10-CM | POA: Diagnosis not present

## 2022-10-25 DIAGNOSIS — D3132 Benign neoplasm of left choroid: Secondary | ICD-10-CM | POA: Diagnosis not present

## 2022-10-30 ENCOUNTER — Other Ambulatory Visit: Payer: Self-pay | Admitting: Internal Medicine

## 2022-11-03 ENCOUNTER — Encounter: Payer: Self-pay | Admitting: Internal Medicine

## 2022-11-03 NOTE — Progress Notes (Unsigned)
    Subjective:    Patient ID: [name], female    DOB: 1934/12/31, 88 y.o.   MRN: [id number]      HPI Lura is here for No chief complaint on file.    Back pain, severe -     Medications and allergies reviewed with patient and updated if appropriate.  Current Outpatient Medications on File Prior to Visit  Medication Sig Dispense Refill   albuterol (VENTOLIN HFA) 108 (90 Base) MCG/ACT inhaler INHALE 2 PUFFS BY MOUTH EVERY 6 HOURS AS NEEDED FOR WHEEZING OR SHORTNESS OF BREATH 18 g 5   Alum Hydroxide-Mag Carbonate (GAVISCON PO) Take 1 Dose by mouth as directed.     amLODipine (NORVASC) 5 MG tablet TAKE 1 TABLET(5 MG) BY MOUTH DAILY 90 tablet 1   aspirin 81 MG tablet Take 81 mg by mouth daily.     Aspirin-Acetaminophen-Caffeine (EXCEDRIN PO) Take 1 Dose by mouth as directed.     Cholecalciferol (VITAMIN D3) 50 MCG (2000 UT) capsule Take 1 capsule (2,000 Units total) by mouth daily. 100 capsule 3   cyclobenzaprine (FLEXERIL) 5 MG tablet Take 1 tablet (5 mg total) by mouth 3 (three) times daily as needed for muscle spasms. 30 tablet 1   fluticasone (FLONASE) 50 MCG/ACT nasal spray SHAKE LIQUID AND USE 2 SPRAYS IN EACH NOSTRIL DAILY 48 g 1   furosemide (LASIX) 40 MG tablet TAKE 1 TABLET(40 MG) BY MOUTH DAILY 90 tablet 1   loratadine (CLARITIN) 10 MG tablet Take 1 tablet (10 mg total) by mouth daily. 30 tablet 11   LORazepam (ATIVAN) 0.5 MG tablet TAKE 1 TABLET(0.5 MG) BY MOUTH DAILY AS NEEDED FOR ANXIETY 30 tablet 5   losartan (COZAAR) 100 MG tablet TAKE 1 TABLET(100 MG) BY MOUTH DAILY 90 tablet 3   rosuvastatin (CRESTOR) 10 MG tablet TAKE 1 TABLET(10 MG) BY MOUTH DAILY 90 tablet 3   RYBELSUS 3 MG TABS TAKE 1 TABLET BY MOUTH DAILY 30 MINUTES BEFORE BREAKFAST. 30 tablet 3   sertraline (ZOLOFT) 50 MG tablet TAKE 1 TABLET(50 MG) BY MOUTH DAILY 90 tablet 1   traMADol (ULTRAM) 50 MG tablet Take 1 tablet (50 mg total) by mouth every 6 (six) hours as needed. 30 tablet 0   No current  facility-administered medications on file prior to visit.    Review of Systems     Objective:  There were no vitals filed for this visit. BP Readings from Last 3 Encounters:  07/19/22 136/80  07/02/22 128/68  06/13/22 (!) 142/80   Wt Readings from Last 3 Encounters:  07/19/22 238 lb (108 kg)  07/02/22 232 lb (105.2 kg)  06/13/22 238 lb 8 oz (108.2 kg)   There is no height or weight on file to calculate BMI.    Physical Exam         Assessment & Plan:    See Problem List for Assessment and Plan of chronic medical problems.

## 2022-11-04 ENCOUNTER — Ambulatory Visit (INDEPENDENT_AMBULATORY_CARE_PROVIDER_SITE_OTHER): Payer: Medicare PPO

## 2022-11-04 ENCOUNTER — Ambulatory Visit (INDEPENDENT_AMBULATORY_CARE_PROVIDER_SITE_OTHER): Payer: Medicare PPO | Admitting: Internal Medicine

## 2022-11-04 VITALS — BP 120/68 | HR 65 | Temp 98.2°F | Ht 67.0 in

## 2022-11-04 DIAGNOSIS — M5136 Other intervertebral disc degeneration, lumbar region: Secondary | ICD-10-CM | POA: Diagnosis not present

## 2022-11-04 DIAGNOSIS — M545 Low back pain, unspecified: Secondary | ICD-10-CM | POA: Diagnosis not present

## 2022-11-04 MED ORDER — TRAMADOL HCL 50 MG PO TABS: 50.0000 mg | ORAL_TABLET | Freq: Three times a day (TID) | ORAL | 0 refills | Status: DC | PRN

## 2022-11-04 MED ORDER — FUROSEMIDE 40 MG PO TABS: ORAL_TABLET | ORAL | 1 refills | Status: DC

## 2022-11-04 NOTE — Assessment & Plan Note (Addendum)
Acute in nature-started acute in nature-started last week for no apparent reason-denies any new activities, falls or injuries ?  Has osteoporosis or not-has deferred bone density-will get x-ray Start tramadol 50-100 mg 3 times daily for severe pain since over-the-counter medications are not helping-avoid NSAIDs Will hold off on trial of steroids since fracture is a possibility Depending on x-ray results will consider referral to sports medicine, may need MRI At this point I think her pain is too great to consider physical therapy

## 2022-11-04 NOTE — Patient Instructions (Signed)
     Have an xray downstairs.      Medications changes include :   tramadol 50--100 mg 3 times a day for severe pain

## 2022-11-14 ENCOUNTER — Other Ambulatory Visit: Payer: Self-pay | Admitting: Internal Medicine

## 2022-11-27 ENCOUNTER — Ambulatory Visit (INDEPENDENT_AMBULATORY_CARE_PROVIDER_SITE_OTHER): Payer: Medicare PPO | Admitting: Internal Medicine

## 2022-11-27 ENCOUNTER — Encounter: Payer: Self-pay | Admitting: Internal Medicine

## 2022-11-27 VITALS — BP 118/76 | HR 62 | Temp 98.0°F | Ht 67.0 in | Wt 231.0 lb

## 2022-11-27 DIAGNOSIS — R21 Rash and other nonspecific skin eruption: Secondary | ICD-10-CM | POA: Diagnosis not present

## 2022-11-27 DIAGNOSIS — Z7984 Long term (current) use of oral hypoglycemic drugs: Secondary | ICD-10-CM | POA: Diagnosis not present

## 2022-11-27 DIAGNOSIS — N1831 Chronic kidney disease, stage 3a: Secondary | ICD-10-CM

## 2022-11-27 DIAGNOSIS — E1122 Type 2 diabetes mellitus with diabetic chronic kidney disease: Secondary | ICD-10-CM

## 2022-11-27 MED ORDER — PREDNISONE 10 MG PO TABS: ORAL_TABLET | ORAL | 0 refills | Status: DC

## 2022-11-27 NOTE — Progress Notes (Signed)
Subjective:    Patient ID: [name], female    DOB: 18-Nov-1934, 88 y.o.   MRN: [id number]      HPI [name] is here for  Chief Complaint  Patient presents with   Rash    Noticed on rash on Friday (rash that has spread all over)     Started 5 days itchy papules on chest, neck, back, abdomen arms and legs.  She noticed it because it was itchy.  She is getting new lesions.  She uses rubbing alcohol and cortisone cream and that helps.  Does not seem to be getting better.  She denies any new exposures or new products.  She denies travel.  If she is driving her crazy.  She is taking claritin daily.   Benadryl did not help her symptoms.     Medications and allergies reviewed with patient and updated if appropriate.  Current Outpatient Medications on File Prior to Visit  Medication Sig Dispense Refill   albuterol (VENTOLIN HFA) 108 (90 Base) MCG/ACT inhaler INHALE 2 PUFFS BY MOUTH EVERY 6 HOURS AS NEEDED FOR WHEEZING OR SHORTNESS OF BREATH 18 g 5   Alum Hydroxide-Mag Carbonate (GAVISCON PO) Take 1 Dose by mouth as directed.     amLODipine (NORVASC) 5 MG tablet TAKE 1 TABLET(5 MG) BY MOUTH DAILY 90 tablet 1   aspirin 81 MG tablet Take 81 mg by mouth daily.     Aspirin-Acetaminophen-Caffeine (EXCEDRIN PO) Take 1 Dose by mouth as directed.     Cholecalciferol (VITAMIN D3) 50 MCG (2000 UT) capsule Take 1 capsule (2,000 Units total) by mouth daily. 100 capsule 3   cyclobenzaprine (FLEXERIL) 5 MG tablet Take 1 tablet (5 mg total) by mouth 3 (three) times daily as needed for muscle spasms. 30 tablet 1   fluticasone (FLONASE) 50 MCG/ACT nasal spray SHAKE LIQUID AND USE 2 SPRAYS IN EACH NOSTRIL DAILY 48 g 1   furosemide (LASIX) 40 MG tablet Take one pill every other day 90 tablet 1   loratadine (CLARITIN) 10 MG tablet Take 1 tablet (10 mg total) by mouth daily. 30 tablet 11   LORazepam (ATIVAN) 0.5 MG tablet TAKE 1 TABLET(0.5 MG) BY MOUTH DAILY AS NEEDED FOR ANXIETY 30 tablet 5   losartan  (COZAAR) 100 MG tablet TAKE 1 TABLET(100 MG) BY MOUTH DAILY 90 tablet 3   rosuvastatin (CRESTOR) 10 MG tablet TAKE 1 TABLET(10 MG) BY MOUTH DAILY 90 tablet 3   RYBELSUS 3 MG TABS TAKE 1 TABLET BY MOUTH DAILY 30 MINUTES BEFORE BREAKFAST. 30 tablet 3   sertraline (ZOLOFT) 50 MG tablet TAKE 1 TABLET(50 MG) BY MOUTH DAILY 90 tablet 1   traMADol (ULTRAM) 50 MG tablet Take 1-2 tablets (50-100 mg total) by mouth every 8 (eight) hours as needed (severe lower back pain). 30 tablet 0   No current facility-administered medications on file prior to visit.    Review of Systems     Objective:   Vitals:   11/27/22 1600  BP: 118/76  Pulse: 62  Temp: 98 F (36.7 C)  SpO2: 97%   BP Readings from Last 3 Encounters:  11/27/22 118/76  11/04/22 120/68  07/19/22 136/80   Wt Readings from Last 3 Encounters:  11/27/22 231 lb (104.8 kg)  07/19/22 238 lb (108 kg)  07/02/22 232 lb (105.2 kg)   Body mass index is 36.18 kg/m.    Physical Exam Constitutional:      General: She is not in acute distress.  Appearance: Normal appearance. She is not ill-appearing.  HENT:     Head: Normocephalic and atraumatic.  Skin:    General: Skin is warm and dry.     Findings: Rash (Scattered papules that are raised and slightly erythematous on chest, neck, upper back, arms and legs) present.  Neurological:     Mental Status: She is alert.            Assessment & Plan:    See Problem List for Assessment and Plan of chronic medical problems.

## 2022-11-27 NOTE — Assessment & Plan Note (Signed)
Acute Started 5 days ago Itchy, and several parts of the body No obvious cause-no new products, foods No exposures, travel or anything else that could be possible causes Continue Claritin daily Benadryl was normal effective Has tried some steroid cream and help minimally so not sure approximately helpful Will do prednisone taper-40 mg daily x 2 days, 30 mg daily x 2 days, 20 mg daily x 2 days and 10 mg daily x 2 days-sugars well-controlled so I think this is acceptable

## 2022-11-27 NOTE — Assessment & Plan Note (Signed)
Chronic Sugars very well-controlled Lab Results  Component Value Date   HGBA1C 5.5 07/02/2022   Acceptable risk to have prednisone taper for rash Continue Rybelsus 3 mg daily

## 2022-11-27 NOTE — Patient Instructions (Addendum)
       Medications changes include :   prednisone taper.  Take this with food.         Return if symptoms worsen or fail to improve.

## 2022-12-17 IMAGING — MG MM DIGITAL SCREENING BILAT W/ TOMO AND CAD
6 of 10 series · 6 of 30 positions shown · non-contrast
Comparison: Previous exam(s).

CLINICAL DATA: Screening.

EXAM:
DIGITAL SCREENING BILATERAL MAMMOGRAM WITH TOMOSYNTHESIS AND CAD
TECHNIQUE: Bilateral screening digital craniocaudal and mediolateral oblique
mammograms were obtained. Bilateral screening digital breast
tomosynthesis was performed. The images were evaluated with
computer-aided detection.

[R CC synth-2D]
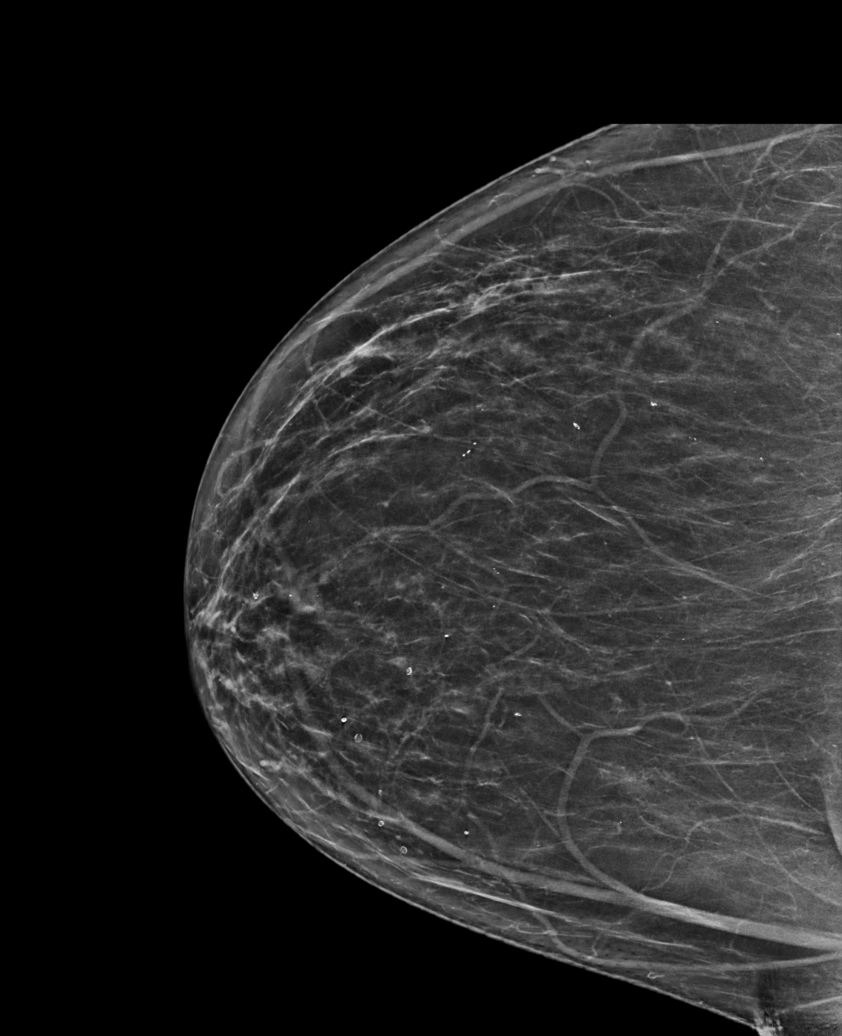

[L CC synth-2D]
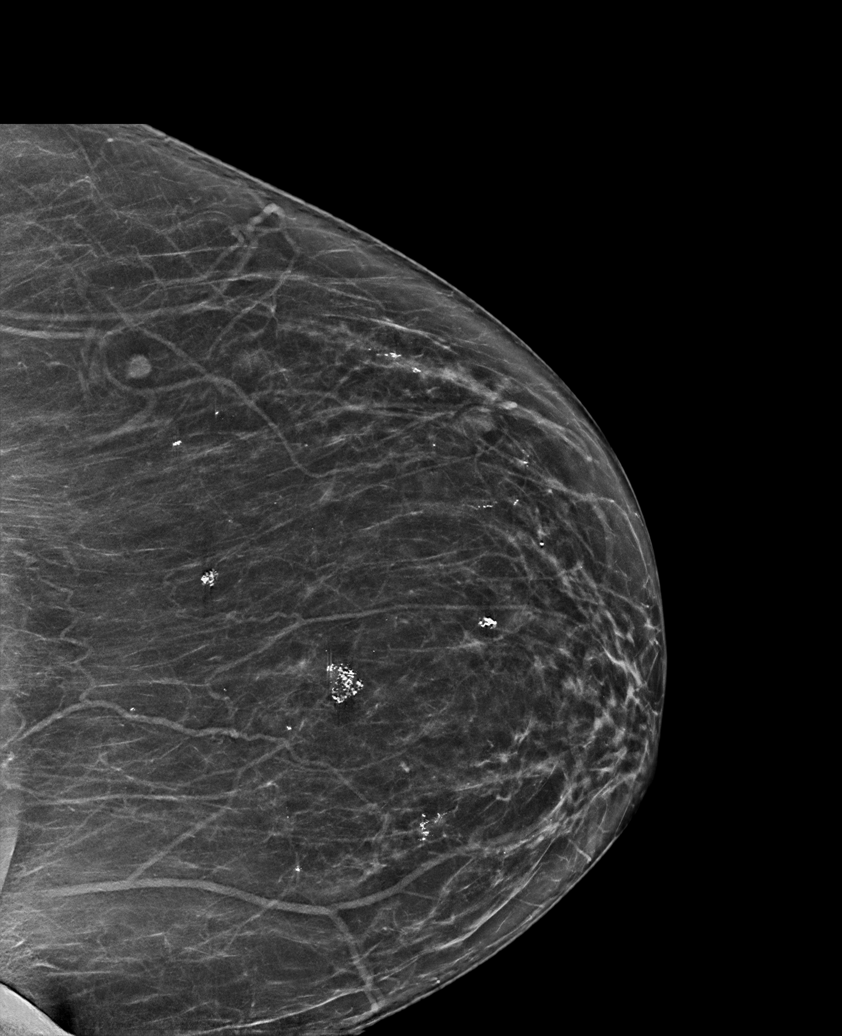

[L CV synth-2D]
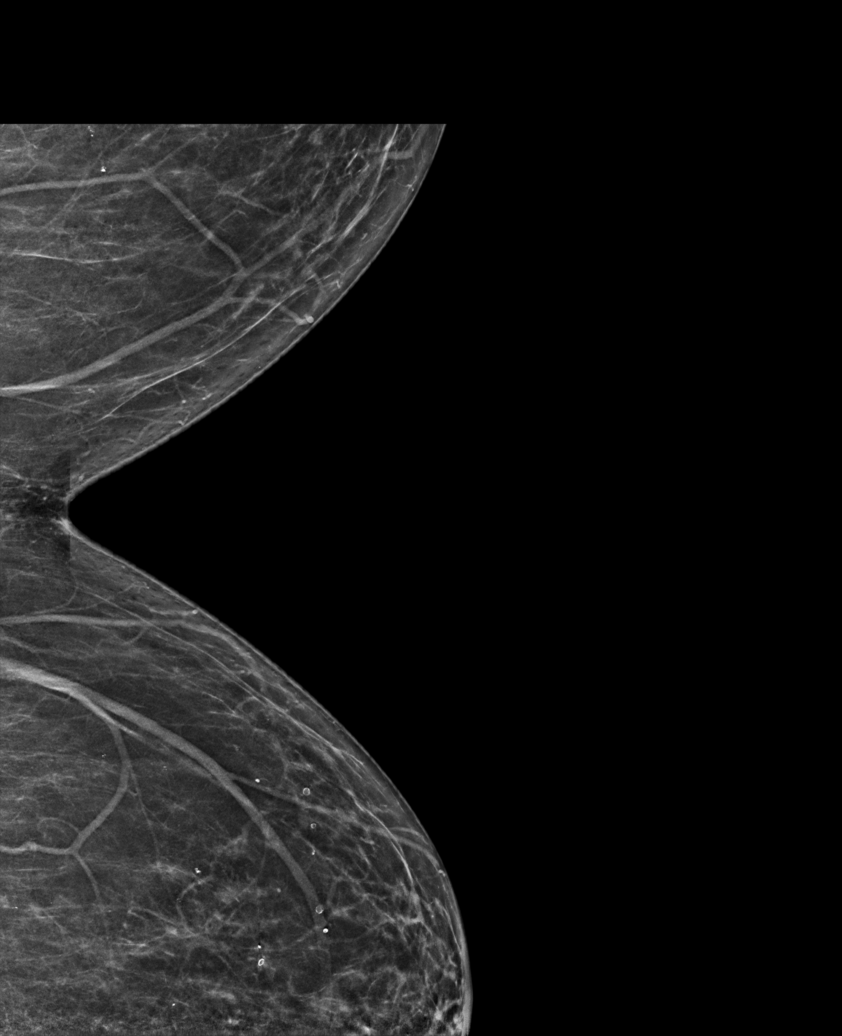

[L MLO synth-2D]
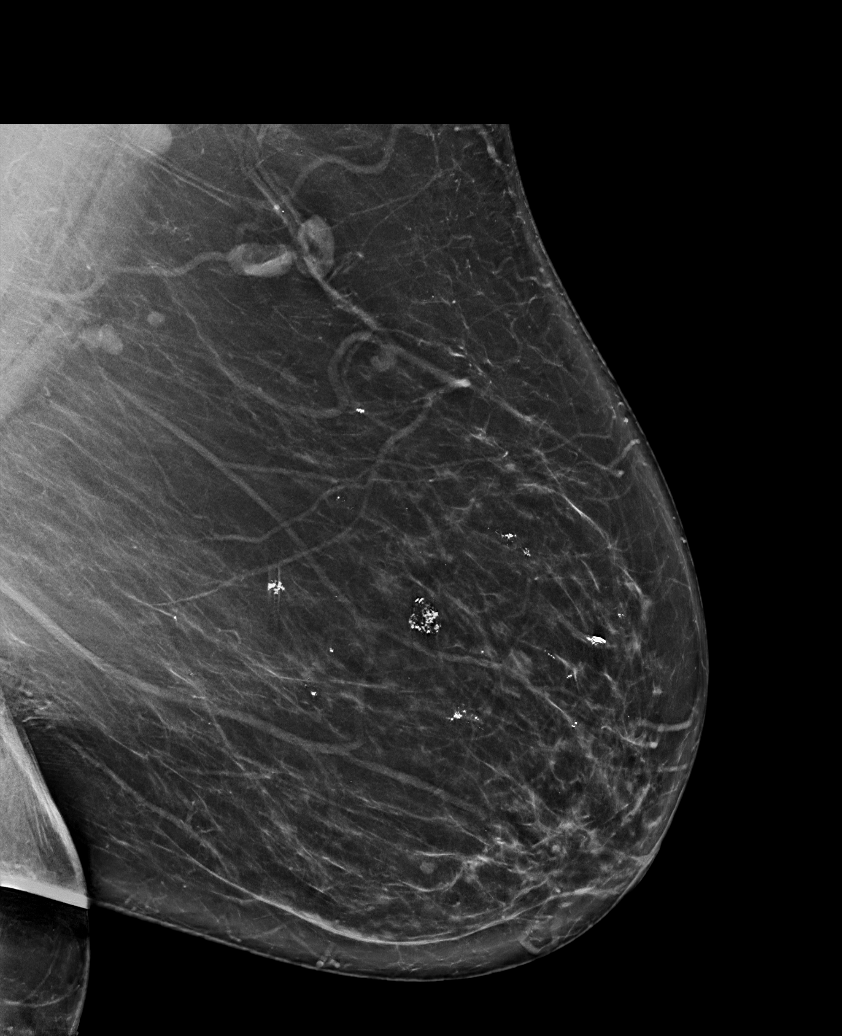

[R MLO synth-2D]
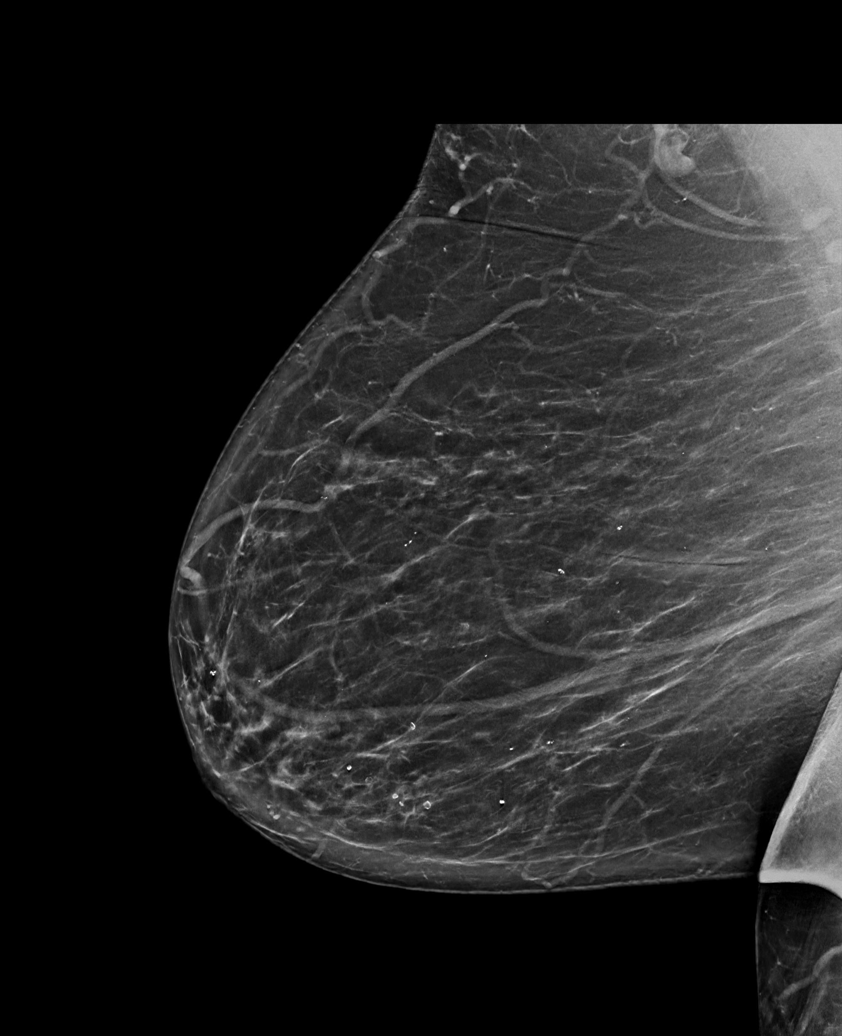

[L MLO tomo · tomo slice 44/87.0]
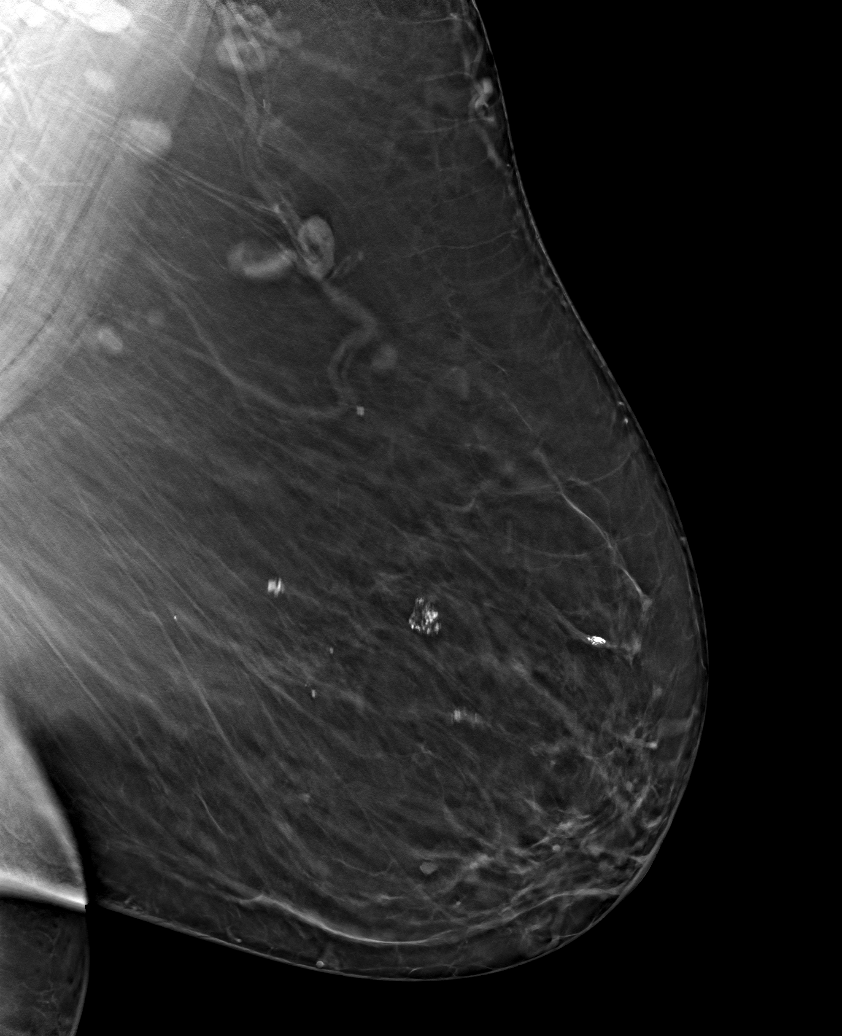

[6 of 30 positions shown; findings below may reference images not displayed]

ACR Breast Density Category b: There are scattered areas of
fibroglandular density.
FINDINGS: There are no findings suspicious for malignancy.
IMPRESSION: No mammographic evidence of malignancy. A result letter of this
screening mammogram will be mailed directly to the patient.

RECOMMENDATION:
Screening mammogram in one year. (Code:51-O-LD2)

BI-RADS CATEGORY  1: Negative.

## 2023-01-02 ENCOUNTER — Encounter: Payer: Self-pay | Admitting: Internal Medicine

## 2023-01-02 NOTE — Progress Notes (Signed)
Subjective:    Patient ID: [name], female    DOB: 09-10-1934, 88 y.o.   MRN: [id number]     HPI [name] is here for follow up of her chronic medical problems.   Dec hearing - ? Wax   Medications and allergies reviewed with patient and updated if appropriate.  Current Outpatient Medications on File Prior to Visit  Medication Sig Dispense Refill   albuterol (VENTOLIN HFA) 108 (90 Base) MCG/ACT inhaler INHALE 2 PUFFS BY MOUTH EVERY 6 HOURS AS NEEDED FOR WHEEZING OR SHORTNESS OF BREATH 18 g 5   Alum Hydroxide-Mag Carbonate (GAVISCON PO) Take 1 Dose by mouth as directed.     amLODipine (NORVASC) 5 MG tablet TAKE 1 TABLET(5 MG) BY MOUTH DAILY 90 tablet 1   aspirin 81 MG tablet Take 81 mg by mouth daily.     Aspirin-Acetaminophen-Caffeine (EXCEDRIN PO) Take 1 Dose by mouth as directed.     Cholecalciferol (VITAMIN D3) 50 MCG (2000 UT) capsule Take 1 capsule (2,000 Units total) by mouth daily. 100 capsule 3   cyclobenzaprine (FLEXERIL) 5 MG tablet Take 1 tablet (5 mg total) by mouth 3 (three) times daily as needed for muscle spasms. 30 tablet 1   fluticasone (FLONASE) 50 MCG/ACT nasal spray SHAKE LIQUID AND USE 2 SPRAYS IN EACH NOSTRIL DAILY 48 g 1   furosemide (LASIX) 40 MG tablet Take one pill every other day 90 tablet 1   loratadine (CLARITIN) 10 MG tablet Take 1 tablet (10 mg total) by mouth daily. 30 tablet 11   LORazepam (ATIVAN) 0.5 MG tablet TAKE 1 TABLET(0.5 MG) BY MOUTH DAILY AS NEEDED FOR ANXIETY 30 tablet 5   losartan (COZAAR) 100 MG tablet TAKE 1 TABLET(100 MG) BY MOUTH DAILY 90 tablet 3   rosuvastatin (CRESTOR) 10 MG tablet TAKE 1 TABLET(10 MG) BY MOUTH DAILY 90 tablet 3   RYBELSUS 3 MG TABS TAKE 1 TABLET BY MOUTH DAILY 30 MINUTES BEFORE BREAKFAST. 30 tablet 3   sertraline (ZOLOFT) 50 MG tablet TAKE 1 TABLET(50 MG) BY MOUTH DAILY 90 tablet 1   traMADol (ULTRAM) 50 MG tablet Take 1-2 tablets (50-100 mg total) by mouth every 8 (eight) hours as needed (severe lower back  pain). 30 tablet 0   No current facility-administered medications on file prior to visit.     Review of Systems  Constitutional:  Negative for fever.  Respiratory:  Positive for shortness of breath (with exertion - chronic and w/o change). Negative for cough and wheezing.   Cardiovascular:  Positive for leg swelling (intermittent). Negative for chest pain and palpitations.  Neurological:  Negative for light-headedness and headaches.       Objective:   Vitals:   01/03/23 1056  BP: 120/62  Pulse: 69  Temp: 98.6 F (37 C)  SpO2: 95%   BP Readings from Last 3 Encounters:  01/03/23 120/62  11/27/22 118/76  11/04/22 120/68   Wt Readings from Last 3 Encounters:  01/03/23 237 lb 6.4 oz (107.7 kg)  11/27/22 231 lb (104.8 kg)  07/19/22 238 lb (108 kg)   Body mass index is 37.18 kg/m.    Physical Exam Constitutional:      General: She is not in acute distress.    Appearance: Normal appearance.  HENT:     Head: Normocephalic and atraumatic.     Right Ear: Tympanic membrane, ear canal and external ear normal. There is no impacted cerumen.     Left Ear: Tympanic membrane, ear  canal and external ear normal. There is no impacted cerumen.  Eyes:     Conjunctiva/sclera: Conjunctivae normal.  Cardiovascular:     Rate and Rhythm: Normal rate and regular rhythm.     Heart sounds: Normal heart sounds.  Pulmonary:     Effort: Pulmonary effort is normal. No respiratory distress.     Breath sounds: Normal breath sounds. No wheezing.  Musculoskeletal:     Cervical back: Neck supple.     Right lower leg: No edema.     Left lower leg: No edema.  Lymphadenopathy:     Cervical: No cervical adenopathy.  Skin:    General: Skin is warm and dry.     Findings: No rash.  Neurological:     Mental Status: She is alert. Mental status is at baseline.  Psychiatric:        Mood and Affect: Mood normal.        Behavior: Behavior normal.        Lab Results  Component Value Date   WBC  9.0 07/02/2022   HGB 11.7 (L) 07/02/2022   HCT 34.5 (L) 07/02/2022   PLT 291.0 07/02/2022   GLUCOSE 105 (H) 07/02/2022   CHOL 123 07/02/2022   TRIG 74.0 07/02/2022   HDL 30.00 (L) 07/02/2022   LDLCALC 78 07/02/2022   ALT 10 07/02/2022   AST 13 07/02/2022   NA 136 07/02/2022   K 4.1 07/02/2022   CL 100 07/02/2022   CREATININE 1.22 (H) 07/02/2022   BUN 19 07/02/2022   CO2 31 07/02/2022   TSH 2.22 01/12/2020   HGBA1C 5.5 07/02/2022   MICROALBUR <0.7 12/20/2021     Assessment & Plan:    See Problem List for Assessment and Plan of chronic medical problems.

## 2023-01-02 NOTE — Patient Instructions (Addendum)
      Blood work was ordered.   The lab is on the first floor.    Medications changes include :   none    A referral was ordered for Podiatry  and someone will call you to schedule an appointment.     Return in about 6 months (around 07/05/2023) for Physical Exam.

## 2023-01-03 ENCOUNTER — Ambulatory Visit (INDEPENDENT_AMBULATORY_CARE_PROVIDER_SITE_OTHER): Payer: Medicare PPO | Admitting: Internal Medicine

## 2023-01-03 VITALS — BP 120/62 | HR 69 | Temp 98.6°F | Ht 67.0 in | Wt 237.4 lb

## 2023-01-03 DIAGNOSIS — I700 Atherosclerosis of aorta: Secondary | ICD-10-CM

## 2023-01-03 DIAGNOSIS — N1831 Chronic kidney disease, stage 3a: Secondary | ICD-10-CM | POA: Diagnosis not present

## 2023-01-03 DIAGNOSIS — J4520 Mild intermittent asthma, uncomplicated: Secondary | ICD-10-CM | POA: Diagnosis not present

## 2023-01-03 DIAGNOSIS — H9193 Unspecified hearing loss, bilateral: Secondary | ICD-10-CM

## 2023-01-03 DIAGNOSIS — F419 Anxiety disorder, unspecified: Secondary | ICD-10-CM

## 2023-01-03 DIAGNOSIS — I10 Essential (primary) hypertension: Secondary | ICD-10-CM | POA: Diagnosis not present

## 2023-01-03 DIAGNOSIS — F32A Depression, unspecified: Secondary | ICD-10-CM

## 2023-01-03 DIAGNOSIS — H9190 Unspecified hearing loss, unspecified ear: Secondary | ICD-10-CM | POA: Insufficient documentation

## 2023-01-03 DIAGNOSIS — E1122 Type 2 diabetes mellitus with diabetic chronic kidney disease: Secondary | ICD-10-CM | POA: Diagnosis not present

## 2023-01-03 DIAGNOSIS — L602 Onychogryphosis: Secondary | ICD-10-CM

## 2023-01-03 DIAGNOSIS — M25562 Pain in left knee: Secondary | ICD-10-CM

## 2023-01-03 DIAGNOSIS — M25561 Pain in right knee: Secondary | ICD-10-CM

## 2023-01-03 DIAGNOSIS — G8929 Other chronic pain: Secondary | ICD-10-CM

## 2023-01-03 DIAGNOSIS — E7849 Other hyperlipidemia: Secondary | ICD-10-CM

## 2023-01-03 LAB — COMPREHENSIVE METABOLIC PANEL
ALT: 9 U/L (ref 0–35)
AST: 12 U/L (ref 0–37)
Albumin: 3.9 g/dL (ref 3.5–5.2)
Alkaline Phosphatase: 106 U/L (ref 39–117)
BUN: 20 mg/dL (ref 6–23)
CO2: 31 mEq/L (ref 19–32)
Calcium: 9.5 mg/dL (ref 8.4–10.5)
Chloride: 103 mEq/L (ref 96–112)
Creatinine, Ser: 1.13 mg/dL (ref 0.40–1.20)
GFR: 44.08 mL/min — ABNORMAL LOW (ref >60.00)
Glucose, Bld: 117 mg/dL — ABNORMAL HIGH (ref 70–99)
Potassium: 4.4 mEq/L (ref 3.5–5.1)
Sodium: 137 mEq/L (ref 135–145)
Total Bilirubin: 1.1 mg/dL (ref 0.2–1.2)
Total Protein: 7.3 g/dL (ref 6.0–8.3)

## 2023-01-03 LAB — CBC WITH DIFFERENTIAL/PLATELET
Basophils Absolute: 0.1 10*3/uL (ref 0.0–0.1)
Basophils Relative: 0.9 % (ref 0.0–3.0)
Eosinophils Absolute: 0.2 10*3/uL (ref 0.0–0.7)
Eosinophils Relative: 1.9 % (ref 0.0–5.0)
HCT: 35.3 % — ABNORMAL LOW (ref 36.0–46.0)
Hemoglobin: 11.4 g/dL — ABNORMAL LOW (ref 12.0–15.0)
Lymphocytes Relative: 14.7 % (ref 12.0–46.0)
Lymphs Abs: 1.4 10*3/uL (ref 0.7–4.0)
MCHC: 32.3 g/dL (ref 30.0–36.0)
MCV: 97 fl (ref 78.0–100.0)
Monocytes Absolute: 0.7 10*3/uL (ref 0.1–1.0)
Monocytes Relative: 7.7 % (ref 3.0–12.0)
Neutro Abs: 7 10*3/uL (ref 1.4–7.7)
Neutrophils Relative %: 74.8 % (ref 43.0–77.0)
Platelets: 296 10*3/uL (ref 150.0–400.0)
RBC: 3.64 Mil/uL — ABNORMAL LOW (ref 3.87–5.11)
RDW: 12.5 % (ref 11.5–15.5)
WBC: 9.4 10*3/uL (ref 4.0–10.5)

## 2023-01-03 LAB — LIPID PANEL
Cholesterol: 120 mg/dL (ref 0–200)
HDL: 31.9 mg/dL — ABNORMAL LOW (ref >39.00)
LDL Cholesterol: 76 mg/dL (ref 0–99)
NonHDL: 88.29
Total CHOL/HDL Ratio: 4
Triglycerides: 63 mg/dL (ref 0.0–149.0)
VLDL: 12.6 mg/dL (ref 0.0–40.0)

## 2023-01-03 LAB — HEMOGLOBIN A1C: Hgb A1c MFr Bld: 5.6 % (ref 4.6–6.5)

## 2023-01-03 NOTE — Assessment & Plan Note (Signed)
Chronic Regular exercise and healthy diet encouraged Check lipid panel  Continue Crestor 10 mg daily 

## 2023-01-03 NOTE — Assessment & Plan Note (Signed)
Chronic ?Continue rosuvastatin 10 mg daily ?

## 2023-01-03 NOTE — Assessment & Plan Note (Signed)
Chronic Continue Rybelsus 3 mg daily-sugars well controlled, but hoping this medication will help with continued weight loss Encouraged high-protein and vegetable diet low in carbs/sugars Encouraged decrease portions Encourage as much activity as tolerated with severe knee arthritis 

## 2023-01-03 NOTE — Assessment & Plan Note (Signed)
Chronic Blood pressure well controlled CMP Continue amlodipine 5 mg daily, losartan 100 mg daily 

## 2023-01-03 NOTE — Assessment & Plan Note (Signed)
Chronic Mild, intermittent Controlled Continue albuterol inhaler as needed 

## 2023-01-03 NOTE — Assessment & Plan Note (Signed)
Chronic  Lab Results  Component Value Date   HGBA1C 5.5 07/02/2022   Sugars well-controlled Continue Rybelsus 3 mg daily

## 2023-01-03 NOTE — Assessment & Plan Note (Signed)
She notes decreased hearing was concerned about excessive wax Ears are clean-no cerumen impaction Discussed possible hearing evaluation-she will think about this and let me know if she wants to have it done

## 2023-01-03 NOTE — Assessment & Plan Note (Signed)
Chronic GFR has been fairly stable Avoid NSAIDs CMP

## 2023-01-03 NOTE — Assessment & Plan Note (Addendum)
Chronic Controlled, Stable but feels anxious at night when she wakes in the middle of the night Continue sertraline 50 mg daily, lorazepam 0.5 mg daily as needed

## 2023-01-03 NOTE — Assessment & Plan Note (Signed)
Chronic Follows with orthopedics Does not want surgery-uses a cane to ambulate Has had injections in the past Was taking NSAIDs at 1 point which may have contributed to CKD-no longer taking ibuprofen Taking Tylenol, Voltaren gel 

## 2023-01-13 ENCOUNTER — Ambulatory Visit: Payer: Medicare PPO | Admitting: Internal Medicine

## 2023-01-16 ENCOUNTER — Other Ambulatory Visit: Payer: Self-pay | Admitting: Internal Medicine

## 2023-01-18 ENCOUNTER — Other Ambulatory Visit: Payer: Self-pay | Admitting: Internal Medicine

## 2023-01-23 ENCOUNTER — Other Ambulatory Visit: Payer: Self-pay | Admitting: Internal Medicine

## 2023-02-03 ENCOUNTER — Ambulatory Visit: Payer: Medicare PPO | Admitting: Podiatry

## 2023-02-10 ENCOUNTER — Ambulatory Visit (INDEPENDENT_AMBULATORY_CARE_PROVIDER_SITE_OTHER): Payer: Medicare PPO | Admitting: Podiatry

## 2023-02-10 DIAGNOSIS — M2041 Other hammer toe(s) (acquired), right foot: Secondary | ICD-10-CM | POA: Diagnosis not present

## 2023-02-10 DIAGNOSIS — M79675 Pain in left toe(s): Secondary | ICD-10-CM | POA: Diagnosis not present

## 2023-02-10 DIAGNOSIS — E1151 Type 2 diabetes mellitus with diabetic peripheral angiopathy without gangrene: Secondary | ICD-10-CM | POA: Diagnosis not present

## 2023-02-10 DIAGNOSIS — M79674 Pain in right toe(s): Secondary | ICD-10-CM | POA: Diagnosis not present

## 2023-02-10 DIAGNOSIS — B351 Tinea unguium: Secondary | ICD-10-CM

## 2023-02-10 NOTE — Progress Notes (Signed)
       Subjective:  Patient ID: [name], female    DOB: 03/30/1935,  MRN: [id number]  [name] presents to clinic today for:  Chief Complaint  Patient presents with   Diabetes    DFC/ EXAM  BS - DONT CHECK IT A1C - [facility] - 01/03/23  . Patient notes nails are thick and elongated, causing pain in shoe gear when ambulating.  She is diabetic but notes that she does not check it regularly.  Her last A1c level was 5.6 on 01/03/2023.  PCP is Pincus Sanes, MD. date last seen was 01/03/2023  Allergies  Allergen Reactions   Latex Hives    Review of Systems: Negative except as noted in the HPI.  Objective:  There were no vitals filed for this visit.  [name] is a pleasant 88 y.o. female in NAD. AAO x 3.  Vascular Examination: Patient has palpable DP pulse, absent PT pulse bilateral.  Delayed capillary refill bilateral toes.  Sparse digital hair bilateral.  Proximal to distal cooling WNL bilateral.    Dermatological Examination: Interspaces are clear with no open lesions noted bilateral.  Nails are 3-75mm thick, with yellowish/brown discoloration, subungual debris and distal onycholysis x10.  There is pain with compression of nails x10.    Neurological Examination: Protective sensation intact b/l LE.   Musculoskeletal Examination: Muscle strength 5/5 to all LE muscle groups b/l.      Latest Ref Rng & Units 01/03/2023   11:26 AM 07/02/2022   12:13 PM  Hemoglobin A1C  Hemoglobin-A1c 4.6 - 6.5 % 5.6  5.5    Patient qualifies for at-risk foot care because of diabetes with PVD.  Assessment/Plan: 1. Type II diabetes mellitus with peripheral circulatory disorder (HCC)   2. Hammertoe of right foot   3. Pain due to onychomycosis of toenails of both feet     FOR HOME USE ONLY DME DIABETIC SHOE  Mycotic nails x10 were sharply debrided with sterile nail nippers and power debriding burr to decrease bulk and length.  Order written for diabetic shoes with 3 pairs of diabetic  insoles.  She would benefit from this for assistance with balance and properly fitting shoes.  She qualifies for these due to diabetes with PVD and hammertoe deformities.  Return in about 3 months (around 05/13/2023) for [facility].   Clerance Lav, DPM, FACFAS [facility]     [address] [place], [place] [address]                Office [phone]  Fax [phone]

## 2023-02-11 ENCOUNTER — Telehealth: Payer: Self-pay | Admitting: Podiatry

## 2023-02-11 NOTE — Telephone Encounter (Signed)
Pt checking on the status of her Rx for foot cream.    Please advise

## 2023-02-13 ENCOUNTER — Other Ambulatory Visit: Payer: Self-pay | Admitting: Podiatry

## 2023-02-13 ENCOUNTER — Telehealth: Payer: Self-pay | Admitting: Podiatry

## 2023-02-13 DIAGNOSIS — B353 Tinea pedis: Secondary | ICD-10-CM

## 2023-02-13 MED ORDER — KETOCONAZOLE 2 % EX CREA
1.0000 | TOPICAL_CREAM | Freq: Every day | CUTANEOUS | 2 refills | Status: AC
Start: 2023-02-13 — End: ?

## 2023-02-13 NOTE — Telephone Encounter (Signed)
Patient called and wanted cream sent in for the bottom of her feet.  Rx ketoconazole sent in and spoke to patient about it, and confirmed pharmacy.

## 2023-02-17 ENCOUNTER — Other Ambulatory Visit: Payer: Self-pay | Admitting: Internal Medicine

## 2023-02-26 ENCOUNTER — Other Ambulatory Visit: Payer: Self-pay | Admitting: Internal Medicine

## 2023-02-27 ENCOUNTER — Encounter (INDEPENDENT_AMBULATORY_CARE_PROVIDER_SITE_OTHER): Payer: Self-pay

## 2023-03-12 ENCOUNTER — Telehealth: Payer: Self-pay | Admitting: Internal Medicine

## 2023-03-12 MED ORDER — LORAZEPAM 0.5 MG PO TABS: 0.5000 mg | ORAL_TABLET | Freq: Every day | ORAL | 0 refills | Status: DC | PRN

## 2023-03-12 NOTE — Telephone Encounter (Signed)
Patient said she was told by the pharmacy that they did not receive the directions for patient's LORazepam (ATIVAN) 0.5 MG tablet. They need it re-sent with the directions. Best callback for patient is [phone].

## 2023-03-12 NOTE — Telephone Encounter (Signed)
Prescription sent

## 2023-03-27 ENCOUNTER — Ambulatory Visit (INDEPENDENT_AMBULATORY_CARE_PROVIDER_SITE_OTHER): Payer: Medicare PPO

## 2023-03-27 VITALS — Ht 67.0 in | Wt 230.0 lb

## 2023-03-27 DIAGNOSIS — Z0000 Encounter for general adult medical examination without abnormal findings: Secondary | ICD-10-CM | POA: Diagnosis not present

## 2023-03-27 NOTE — Progress Notes (Signed)
Subjective:   [name] is a 88 y.o. female who presents for Medicare Annual (Subsequent) preventive examination.  Visit Complete: Virtual  I connected with  [name] on 03/27/23 by a audio enabled telemedicine application and verified that I am speaking with the correct person using two identifiers.  Patient Location: Home  Provider Location: Office/Clinic  I discussed the limitations of evaluation and management by telemedicine. The patient expressed understanding and agreed to proceed.  Vital Signs: Because this visit was a virtual/telehealth visit, some criteria may be missing or patient reported. Any vitals not documented were not able to be obtained and vitals that have been documented are patient reported.   Patient provided his/her weight during this virtual phone visit.  Review of Systems     Cardiac Risk Factors include: advanced age (>26men, >22 women);dyslipidemia;hypertension;obesity (BMI >30kg/m2);family history of premature cardiovascular disease     Objective:    Today's Vitals   03/27/23 1534 03/27/23 1536  Weight: 230 lb (104.3 kg)   Height: 5\' 7"  (1.702 m)   PainSc: 6  6   PainLoc: Back    Body mass index is 36.02 kg/m.     03/27/2023    3:38 PM 03/25/2022    3:20 PM 10/03/2017    9:58 AM  Advanced Directives  Does Patient Have a Medical Advance Directive? No Yes No  Type of Special educational needs teacher of [place];Living will   Copy of Healthcare Power of Attorney in Chart?  No - copy requested   Would patient like information on creating a medical advance directive? No - Patient declined  No - Patient declined    Current Medications (verified) Outpatient Encounter Medications as of 03/27/2023  Medication Sig   albuterol (VENTOLIN HFA) 108 (90 Base) MCG/ACT inhaler INHALE 2 PUFFS BY MOUTH EVERY 6 HOURS AS NEEDED FOR WHEEZING OR SHORTNESS OF BREATH   Alum Hydroxide-Mag Carbonate (GAVISCON PO) Take 1 Dose by mouth as directed.    amLODipine (NORVASC) 5 MG tablet TAKE 1 TABLET(5 MG) BY MOUTH DAILY   aspirin 81 MG tablet Take 81 mg by mouth daily.   Aspirin-Acetaminophen-Caffeine (EXCEDRIN PO) Take 1 Dose by mouth as directed.   Cholecalciferol (VITAMIN D3) 50 MCG (2000 UT) capsule Take 1 capsule (2,000 Units total) by mouth daily.   cyclobenzaprine (FLEXERIL) 5 MG tablet Take 1 tablet (5 mg total) by mouth 3 (three) times daily as needed for muscle spasms.   fluticasone (FLONASE) 50 MCG/ACT nasal spray SHAKE LIQUID AND USE 2 SPRAYS IN EACH NOSTRIL DAILY   furosemide (LASIX) 40 MG tablet TAKE 1 TABLET(40 MG) BY MOUTH DAILY   ketoconazole (NIZORAL) 2 % cream Apply 1 Application topically daily. Apply 1gm to bottom of feet daily.   loratadine (CLARITIN) 10 MG tablet Take 1 tablet (10 mg total) by mouth daily.   LORazepam (ATIVAN) 0.5 MG tablet Take 1 tablet (0.5 mg total) by mouth daily as needed for anxiety.   losartan (COZAAR) 100 MG tablet TAKE 1 TABLET(100 MG) BY MOUTH DAILY   rosuvastatin (CRESTOR) 10 MG tablet TAKE 1 TABLET(10 MG) BY MOUTH DAILY   RYBELSUS 3 MG TABS TAKE 1 TABLET BY MOUTH DAILY 30 MINUTES BEFORE BREAKFAST   sertraline (ZOLOFT) 50 MG tablet TAKE 1 TABLET(50 MG) BY MOUTH DAILY   traMADol (ULTRAM) 50 MG tablet Take 1-2 tablets (50-100 mg total) by mouth every 8 (eight) hours as needed (severe lower back pain).   No facility-administered encounter medications on file as of  03/27/2023.    Allergies (verified) Latex   History: Past Medical History:  Diagnosis Date   Allergy    Anemia    Asthma    adult onset   Carotid bruit    left   Hyperlipidemia    LDL goal = < 120 based on NMR Lipoprofile 2005   Hypertension    Metabolic syndrome    Past Surgical History:  Procedure Laterality Date   CARDIAC CATHETERIZATION  2006   negative   CATARACT EXTRACTION, BILATERAL     Dr Hazle Quant   COLONOSCOPY  06/02/2006   Dr.Kaplan,Next Due date 06/02/2016   SHOULDER SURGERY  11/21/2008   Dr.Aplington    TOTAL ABDOMINAL HYSTERECTOMY W/ BILATERAL SALPINGOOPHORECTOMY  1983   Dr.Fore, for fibroids with pain   TOTAL HIP ARTHROPLASTY  2005, 2006   Family History  Problem Relation Age of Onset   Hypertension Father    Stroke Father        > 59   Hypertension Mother    Arthritis Mother    Hypertension Sister    Diabetes Paternal Grandmother    Heart attack Maternal Grandfather        age unknown; ? > 62   Stomach cancer Maternal Grandmother    Social History   Socioeconomic History   Marital status: Widowed    Spouse name: Not on file   Number of children: Not on file   Years of education: Not on file   Highest education level: Not on file  Occupational History   Not on file  Tobacco Use   Smoking status: Never   Smokeless tobacco: Never  Substance and Sexual Activity   Alcohol use: No   Drug use: No   Sexual activity: Not on file  Other Topics Concern   Not on file  Social History Narrative   Right handed   Social Determinants of Health   Financial Resource Strain: Low Risk  (03/27/2023)   Overall Financial Resource Strain (CARDIA)    Difficulty of Paying Living Expenses: Not hard at all  Food Insecurity: No Food Insecurity (03/27/2023)   Hunger Vital Sign    Worried About Running Out of Food in the Last Year: Never true    Ran Out of Food in the Last Year: Never true  Transportation Needs: No Transportation Needs (03/27/2023)   PRAPARE - Administrator, Civil Service (Medical): No    Lack of Transportation (Non-Medical): No  Physical Activity: Inactive (03/27/2023)   Exercise Vital Sign    Days of Exercise per Week: 0 days    Minutes of Exercise per Session: 0 min  Stress: No Stress Concern Present (03/27/2023)   [employer] of Occupational Health - Occupational Stress Questionnaire    Feeling of Stress : Not at all  Social Connections: Socially Integrated (03/27/2023)   Social Connection and Isolation Panel [NHANES]    Frequency of Communication  with Friends and Family: More than three times a week    Frequency of Social Gatherings with Friends and Family: More than three times a week    Attends Religious Services: More than 4 times per year    Active Member of [employer] or Organizations: Yes    Attends Engineer, structural: More than 4 times per year    Marital Status: Married    Tobacco Counseling Counseling given: Not Answered   Clinical Intake:  Pre-visit preparation completed: Yes  Pain : 0-10 Pain Score: 6  Pain Type: Chronic pain Pain  Location: Back Pain Orientation: Lower Pain Onset: More than a month ago Pain Frequency: Constant     BMI - recorded: 36.02 Nutritional Status: BMI > 30  Obese Nutritional Risks: None Diabetes: No  How often do you need to have someone help you when you read instructions, pamphlets, or other written materials from your doctor or pharmacy?: 1 - Never What is the last grade level you completed in school?: Master Degree (2)  Interpreter Needed?: No  Information entered by :: Susie Cassette, LPN.   Activities of Daily Living    03/27/2023    3:44 PM  In your present state of health, do you have any difficulty performing the following activities:  Hearing? 1  Vision? 0  Difficulty concentrating or making decisions? 0  Walking or climbing stairs? 1  Dressing or bathing? 0  Doing errands, shopping? 0  Preparing Food and eating ? N  Using the Toilet? N  In the past six months, have you accidently leaked urine? Y  Do you have problems with loss of bowel control? N  Managing your Medications? N  Managing your Finances? N  Housekeeping or managing your Housekeeping? Y    Patient Care Team: Pincus Sanes, MD as PCP - General (Internal Medicine) Jake Bathe, MD as Consulting Physician (Cardiology) Yolonda Kida, MD as Consulting Physician (Orthopedic Surgery) Nelson Chimes, MD as Attending Physician (Ophthalmology)  Indicate any recent Medical  Services you may have received from other than [facility] providers in the past year (date may be approximate).     Assessment:   This is a routine wellness examination for Aime.  Hearing/Vision screen Hearing Screening - Comments:: Patient has some hearing difficulty.   No hearing aids.  Vision Screening - Comments:: Patient does wear otc readers.  Eye exam done by: [facility]    Goals Addressed             This Visit's Progress    My goal is to wake up every morning, continue to eat healthy and stay independent.        Depression Screen    03/27/2023    3:42 PM 01/03/2023   10:59 AM 11/27/2022    4:17 PM 07/02/2022   11:41 AM 03/25/2022    3:25 PM 12/31/2021    1:06 PM 10/12/2021   10:49 AM  PHQ 2/9 Scores  PHQ - 2 Score 0 0 0 0 0 1 0  PHQ- 9 Score 0   3  7     Fall Risk    03/27/2023    3:39 PM 01/03/2023   10:59 AM 11/27/2022    4:17 PM 07/02/2022   11:41 AM 06/13/2022    9:46 AM  Fall Risk   Falls in the past year? 1 0 0 0 1  Number falls in past yr: 1 0 0 0 0  Injury with Fall? 0 0 0 0 0  Risk for fall due to : History of fall(s);Orthopedic patient No Fall Risks No Fall Risks No Fall Risks --  Risk for fall due to: Comment     with cane, unbalanced  Follow up Education provided;Falls prevention discussed;Falls evaluation completed Falls evaluation completed Falls evaluation completed Falls evaluation completed Falls evaluation completed    MEDICARE RISK AT HOME: Medicare Risk at Home Any stairs in or around the home?: No If so, are there any without handrails?: No Home free of loose throw rugs in walkways, pet beds, electrical cords, etc?: Yes Adequate lighting  in your home to reduce risk of falls?: Yes Life alert?: Yes Use of a cane, walker or w/c?: Yes Grab bars in the bathroom?: No Shower chair or bench in shower?: Yes Elevated toilet seat or a handicapped toilet?: Yes  TIMED UP AND GO:  Was the test performed?  No    Cognitive Function:     10/03/2017   10:00 AM  MMSE - Mini Mental State Exam  Orientation to time 5  Orientation to Place 5  Registration 3  Attention/ Calculation 5  Recall 2  Language- name 2 objects 2  Language- repeat 1  Language- follow 3 step command 3  Language- read & follow direction 1  Write a sentence 1  Copy design 1  Total score 29        03/27/2023    3:41 PM 03/25/2022    3:33 PM  6CIT Screen  What Year? 0 points 0 points  What month? 0 points 0 points  What time? 0 points 0 points  Count back from 20 0 points 0 points  Months in reverse 0 points 0 points  Repeat phrase 0 points 0 points  Total Score 0 points 0 points    Immunizations Immunization History  Administered Date(s) Administered   Fluad Quad(high Dose 65+) 03/26/2019, 04/03/2020, 04/02/2021   Influenza Split 04/03/2012   Influenza Whole 04/29/2006, 05/27/2007, 04/11/2008   Influenza, High Dose Seasonal PF 04/04/2017, 04/08/2018, 04/25/2022   Influenza,inj,Quad PF,6+ Mos 04/06/2013, 04/12/2014   Influenza-Unspecified 04/06/2015, 03/18/2016   Moderna Sars-Covid-2 Vaccination 07/26/2019, 08/23/2019, 05/18/2020   Pneumococcal Conjugate-13 01/31/2015   Pneumococcal Polysaccharide-23 07/16/1995, 08/12/2013   Td 07/17/1992   Tdap 11/26/2010   Zoster, Live 06/03/2012    TDAP status: Due, Education has been provided regarding the importance of this vaccine. Advised may receive this vaccine at local pharmacy or Health Dept. Aware to provide a copy of the vaccination record if obtained from local pharmacy or Health Dept. Verbalized acceptance and understanding.  Flu Vaccine status: Due, Education has been provided regarding the importance of this vaccine. Advised may receive this vaccine at local pharmacy or Health Dept. Aware to provide a copy of the vaccination record if obtained from local pharmacy or Health Dept. Verbalized acceptance and understanding.  Pneumococcal vaccine status: Up to date  Covid-19 vaccine status:  Completed vaccines  Qualifies for Shingles Vaccine? Yes   Zostavax completed Yes   Shingrix Completed?: No.    Education has been provided regarding the importance of this vaccine. Patient has been advised to call insurance company to determine out of pocket expense if they have not yet received this vaccine. Advised may also receive vaccine at local pharmacy or Health Dept. Verbalized acceptance and understanding.  Screening Tests Health Maintenance  Topic Date Due   OPHTHALMOLOGY EXAM  Never done   Zoster Vaccines- Shingrix (1 of 2) 07/29/1986   DEXA SCAN  Never done   DTaP/Tdap/Td (3 - Td or Tdap) 11/25/2020   INFLUENZA VACCINE  02/13/2023   COVID-19 Vaccine (4 - 2023-24 season) 03/16/2023   HEMOGLOBIN A1C  07/05/2023   FOOT EXAM  02/13/2024   Medicare Annual Wellness (AWV)  03/26/2024   Pneumonia Vaccine 28+ Years old  Completed   HPV VACCINES  Aged Out    Health Maintenance  Health Maintenance Due  Topic Date Due   OPHTHALMOLOGY EXAM  Never done   Zoster Vaccines- Shingrix (1 of 2) 07/29/1986   DEXA SCAN  Never done   DTaP/Tdap/Td (3 - Td or  Tdap) 11/25/2020   INFLUENZA VACCINE  02/13/2023   COVID-19 Vaccine (4 - 2023-24 season) 03/16/2023    Colorectal cancer screening: No longer required.   Mammogram status: Completed 05/01/2022. Repeat every year  Bone Density status: Never done.  Lung Cancer Screening: (Low Dose CT Chest recommended if Age 1-80 years, 20 pack-year currently smoking OR have quit w/in 15years.) does not qualify.   Lung Cancer Screening Referral: no  Additional Screening:  Hepatitis C Screening: does not qualify; Completed: no  Vision Screening: Recommended annual ophthalmology exams for early detection of glaucoma and other disorders of the eye. Is the patient up to date with their annual eye exam?  Yes  Who is the provider or what is the name of the office in which the patient attends annual eye exams? [facility] If pt is not  established with a provider, would they like to be referred to a provider to establish care? No .   Dental Screening: Recommended annual dental exams for proper oral hygiene  Diabetic Foot Exam: Diabetic Foot Exam: Completed 02/13/2023  Community Resource Referral / Chronic Care Management: CRR required this visit?  No   CCM required this visit?  No     Plan:     I have personally reviewed and noted the following in the patient's chart:   Medical and social history Use of alcohol, tobacco or illicit drugs  Current medications and supplements including opioid prescriptions. Patient is not currently taking opioid prescriptions. Functional ability and status Nutritional status Physical activity Advanced directives List of other physicians Hospitalizations, surgeries, and ER visits in previous 12 months Vitals Screenings to include cognitive, depression, and falls Referrals and appointments  In addition, I have reviewed and discussed with patient certain preventive protocols, quality metrics, and best practice recommendations. A written personalized care plan for preventive services as well as general preventive health recommendations were provided to patient.     Mickeal Needy, LPN   1/61/0960   After Visit Summary: (Mail) Due to this being a telephonic visit, the after visit summary with patients personalized plan was offered to patient via mail   Nurse Notes: Normal cognitive status assessed by direct observation via telephone conversation by this Nurse Health Advisor. No abnormalities found.  Patient provided his/her weight during this virtual phone visit.

## 2023-03-27 NOTE — Patient Instructions (Signed)
[name] , Thank you for taking time to come for your Medicare Wellness Visit. I appreciate your ongoing commitment to your health goals. Please review the following plan we discussed and let me know if I can assist you in the future.   Referrals/Orders/Follow-Ups/Clinician Recommendations: No  This is a list of the screening recommended for you and due dates:  Health Maintenance  Topic Date Due   Eye exam for diabetics  Never done   Zoster (Shingles) Vaccine (1 of 2) 07/29/1986   DEXA scan (bone density measurement)  Never done   DTaP/Tdap/Td vaccine (3 - Td or Tdap) 11/25/2020   Flu Shot  02/13/2023   COVID-19 Vaccine (4 - 2023-24 season) 03/16/2023   Hemoglobin A1C  07/05/2023   Complete foot exam   02/13/2024   Medicare Annual Wellness Visit  03/26/2024   Pneumonia Vaccine  Completed   HPV Vaccine  Aged Out    Advanced directives: (Copy Requested) Please bring a copy of your health care power of attorney and living will to the office to be added to your chart at your convenience.  Next Medicare Annual Wellness Visit scheduled for next year: Yes  Preventive Care attachment FALL PREVENTION attachment

## 2023-03-31 ENCOUNTER — Other Ambulatory Visit: Payer: Self-pay | Admitting: Internal Medicine

## 2023-03-31 DIAGNOSIS — Z1231 Encounter for screening mammogram for malignant neoplasm of breast: Secondary | ICD-10-CM

## 2023-04-01 ENCOUNTER — Other Ambulatory Visit: Payer: Self-pay | Admitting: Internal Medicine

## 2023-04-04 ENCOUNTER — Telehealth: Payer: Self-pay | Admitting: Internal Medicine

## 2023-04-04 NOTE — Telephone Encounter (Signed)
Patient called and said she got vaccines at the pharmacy yesterday. She said she has not been feeling well since she got them. Patient would like to know what PCP advises. She would like a call back at [phone].

## 2023-04-07 NOTE — Telephone Encounter (Signed)
Spoke with patient and she is much better today.

## 2023-05-02 ENCOUNTER — Other Ambulatory Visit: Payer: Self-pay | Admitting: Internal Medicine

## 2023-05-05 ENCOUNTER — Ambulatory Visit
Admission: RE | Admit: 2023-05-05 | Discharge: 2023-05-05 | Disposition: A | Discharge status: Home / Self Care | Payer: Medicare PPO | Source: Ambulatory Visit | Attending: Internal Medicine | Admitting: Internal Medicine

## 2023-05-05 DIAGNOSIS — Z1231 Encounter for screening mammogram for malignant neoplasm of breast: Secondary | ICD-10-CM

## 2023-05-09 ENCOUNTER — Other Ambulatory Visit: Payer: Self-pay | Admitting: Internal Medicine

## 2023-05-19 ENCOUNTER — Ambulatory Visit: Payer: Medicare PPO | Admitting: Podiatry

## 2023-05-26 ENCOUNTER — Ambulatory Visit (INDEPENDENT_AMBULATORY_CARE_PROVIDER_SITE_OTHER): Payer: Medicare PPO | Admitting: Podiatry

## 2023-05-26 ENCOUNTER — Encounter: Payer: Self-pay | Admitting: Podiatry

## 2023-05-26 DIAGNOSIS — M79674 Pain in right toe(s): Secondary | ICD-10-CM

## 2023-05-26 DIAGNOSIS — M79675 Pain in left toe(s): Secondary | ICD-10-CM | POA: Diagnosis not present

## 2023-05-26 DIAGNOSIS — B351 Tinea unguium: Secondary | ICD-10-CM | POA: Diagnosis not present

## 2023-05-26 DIAGNOSIS — E1151 Type 2 diabetes mellitus with diabetic peripheral angiopathy without gangrene: Secondary | ICD-10-CM

## 2023-05-26 NOTE — Progress Notes (Unsigned)
       Subjective:  Patient ID: [name], female    DOB: 27-Nov-1934,  MRN: [id number]  Vern Claude Crumpler presents to clinic today for:  Chief Complaint  Patient presents with   [facility]    [facility]- Patient has no concerns a this time    Patient notes nails are thick and elongated, causing pain in shoe gear when ambulating.  Patient uses a cane for assistance with ambulation.  She notes that 3 months is too long to wait for her nails to be trimmed.  She states that she would like to have it done every 2 months.  Informed the patient that [facility] will not allow her to come any sooner than 9 weeks for this care to be covered.  PCP is Pincus Sanes, MD. last seen 01/03/2023  Past Medical History:  Diagnosis Date   Allergy    Anemia    Asthma    adult onset   Carotid bruit    left   Hyperlipidemia    LDL goal = < 120 based on NMR Lipoprofile 2005   Hypertension    Metabolic syndrome     Allergies  Allergen Reactions   Latex Hives    Objective:  [name] is a pleasant 88 y.o. female in NAD. AAO x 3.  Vascular Examination: Patient has palpable DP pulse, absent PT pulse bilateral.  Delayed capillary refill bilateral toes.  Sparse digital hair bilateral.  Proximal to distal cooling WNL bilateral.    Dermatological Examination: Interspaces are clear with no open lesions noted bilateral.  Skin is shiny and atrophic bilateral.  Nails are 3-54mm thick, with yellowish/brown discoloration, subungual debris and distal onycholysis x10.  There is pain with compression of nails x10.       Latest Ref Rng & Units 01/03/2023   11:26 AM 07/02/2022   12:13 PM  Hemoglobin A1C  Hemoglobin-A1c 4.6 - 6.5 % 5.6  5.5    Patient qualifies for at-risk foot care because of diabetes with PVD.  Assessment/Plan: 1. Pain due to onychomycosis of toenails of both feet   2. Type II diabetes mellitus with peripheral circulatory disorder (HCC)     Mycotic nails x10 were sharply debrided with sterile nail nippers  and power debriding burr to decrease bulk and length.  Return in about 9 weeks (around 07/28/2023) for [facility].   Clerance Lav, DPM, FACFAS [facility]     [address] [place], [place] [address]                Office [phone]  Fax [phone]

## 2023-05-27 ENCOUNTER — Other Ambulatory Visit: Payer: Self-pay | Admitting: Internal Medicine

## 2023-06-18 ENCOUNTER — Ambulatory Visit: Payer: Medicare PPO

## 2023-06-18 DIAGNOSIS — E1151 Type 2 diabetes mellitus with diabetic peripheral angiopathy without gangrene: Secondary | ICD-10-CM

## 2023-06-18 DIAGNOSIS — I700 Atherosclerosis of aorta: Secondary | ICD-10-CM

## 2023-06-18 DIAGNOSIS — M2041 Other hammer toe(s) (acquired), right foot: Secondary | ICD-10-CM

## 2023-06-18 NOTE — Progress Notes (Signed)
Patient presents to the office today for diabetic shoe and insole measuring.  Patient was measured with brannock device to determine size and width for 1 pair of extra depth shoes and foam casted for 3 pair of insoles.   Documentation of medical necessity will be sent to patient's treating diabetic doctor to verify and sign.   Patient's diabetic provider: Cheryll Cockayne MD  Shoes and insoles will be ordered at that time and patient will be notified for an appointment for fitting when they arrive.  Brannock measurement: 11w Patient shoe selection- Shoe choice:   [id number] / [id number] Shoe size ordered: 11WD  Financials signed  Addison Bailey CPed, CFo, CFm

## 2023-06-19 ENCOUNTER — Other Ambulatory Visit: Payer: Self-pay

## 2023-06-19 ENCOUNTER — Telehealth: Payer: Self-pay | Admitting: Internal Medicine

## 2023-06-19 MED ORDER — ALBUTEROL SULFATE HFA 108 (90 BASE) MCG/ACT IN AERS: INHALATION_SPRAY | RESPIRATORY_TRACT | 5 refills | Status: DC

## 2023-06-19 NOTE — Telephone Encounter (Signed)
Next OV is 07/02/2023.   Prescription Request  06/19/2023  LOV: 01/03/2023  What is the name of the medication or equipment? albuterol (VENTOLIN HFA) 108 (90 Base) MCG/ACT inhaler   Have you contacted your pharmacy to request a refill? Yes   Which pharmacy would you like this sent to?  [id number] [place], [place] - [address] AT [facility] OF [facility] [address] & [facility] [address] [place] [address] Phone: [phone] Fax: [phone]    Patient notified that their request is being sent to the clinical staff for review and that they should receive a response within 2 business days.   Please advise at Mobile [phone] (mobile)

## 2023-06-19 NOTE — Telephone Encounter (Signed)
Script faxed in today. 

## 2023-06-23 ENCOUNTER — Telehealth: Payer: Self-pay | Admitting: Internal Medicine

## 2023-06-23 NOTE — Telephone Encounter (Signed)
Form received and placed in Dr. Lawerance Bach blue folder today.

## 2023-06-23 NOTE — Telephone Encounter (Signed)
Paperwork received for Therapeutic shoes from [facility] via fax and placed in provider's box up front.

## 2023-06-25 DIAGNOSIS — H04123 Dry eye syndrome of bilateral lacrimal glands: Secondary | ICD-10-CM | POA: Diagnosis not present

## 2023-06-25 DIAGNOSIS — H5713 Ocular pain, bilateral: Secondary | ICD-10-CM | POA: Diagnosis not present

## 2023-06-30 NOTE — Telephone Encounter (Signed)
Forms faxed today to [facility] @ [phone]

## 2023-07-01 ENCOUNTER — Encounter: Payer: Self-pay | Admitting: Internal Medicine

## 2023-07-01 NOTE — Progress Notes (Unsigned)
Subjective:    Patient ID: [name], female    DOB: 1935/06/09, 88 y.o.   MRN: [id number]     HPI [name] is here for follow up of her chronic medical problems.  Saw eye doc last week - had very dry eyes, also dry mouth.  ? Sjogrens --- advised checking for it.  Overall doing well.  Still has a lot of knee pain-this limits what she is able to do on how long she is able to walk.  Medications and allergies reviewed with patient and updated if appropriate.  Current Outpatient Medications on File Prior to Visit  Medication Sig Dispense Refill   albuterol (VENTOLIN HFA) 108 (90 Base) MCG/ACT inhaler INHALE 2 PUFFS BY MOUTH EVERY 6 HOURS AS NEEDED FOR WHEEZING OR SHORTNESS OF BREATH 18 g 5   Alum Hydroxide-Mag Carbonate (GAVISCON PO) Take 1 Dose by mouth as directed.     amLODipine (NORVASC) 5 MG tablet TAKE 1 TABLET(5 MG) BY MOUTH DAILY 90 tablet 1   aspirin 81 MG tablet Take 81 mg by mouth daily.     Aspirin-Acetaminophen-Caffeine (EXCEDRIN PO) Take 1 Dose by mouth as directed.     Cholecalciferol (VITAMIN D3) 50 MCG (2000 UT) capsule Take 1 capsule (2,000 Units total) by mouth daily. 100 capsule 3   cyclobenzaprine (FLEXERIL) 5 MG tablet Take 1 tablet (5 mg total) by mouth 3 (three) times daily as needed for muscle spasms. 30 tablet 1   fluticasone (FLONASE) 50 MCG/ACT nasal spray SHAKE LIQUID AND USE 2 SPRAYS IN EACH NOSTRIL DAILY 48 g 1   furosemide (LASIX) 40 MG tablet TAKE 1 TABLET(40 MG) BY MOUTH DAILY 90 tablet 1   ketoconazole (NIZORAL) 2 % cream Apply 1 Application topically daily. Apply 1gm to bottom of feet daily. 60 g 2   loratadine (CLARITIN) 10 MG tablet Take 1 tablet (10 mg total) by mouth daily. 30 tablet 11   losartan (COZAAR) 100 MG tablet TAKE 1 TABLET(100 MG) BY MOUTH DAILY 90 tablet 3   rosuvastatin (CRESTOR) 10 MG tablet TAKE 1 TABLET(10 MG) BY MOUTH DAILY 90 tablet 3   Semaglutide (RYBELSUS) 3 MG TABS TAKE 1 TABLET BY MOUTH DAILY 30 MINUTES BEFORE BREAKFAST  90 tablet 2   sertraline (ZOLOFT) 50 MG tablet TAKE 1 TABLET(50 MG) BY MOUTH DAILY 90 tablet 1   traMADol (ULTRAM) 50 MG tablet Take 1-2 tablets (50-100 mg total) by mouth every 8 (eight) hours as needed (severe lower back pain). 30 tablet 0   No current facility-administered medications on file prior to visit.     Review of Systems  Constitutional:  Negative for fever.  Respiratory:  Positive for shortness of breath (chronic). Negative for cough and wheezing.   Cardiovascular:  Positive for leg swelling (takes lasix qod). Negative for chest pain and palpitations.  Musculoskeletal:  Positive for arthralgias and back pain.  Neurological:  Negative for light-headedness and headaches.       Objective:   Vitals:   07/02/23 1020  BP: 114/68  Pulse: 68  Temp: 98.5 F (36.9 C)  SpO2: 98%   BP Readings from Last 3 Encounters:  07/02/23 114/68  01/03/23 120/62  11/27/22 118/76   Wt Readings from Last 3 Encounters:  07/02/23 231 lb (104.8 kg)  03/27/23 230 lb (104.3 kg)  01/03/23 237 lb 6.4 oz (107.7 kg)   Body mass index is 36.18 kg/m.    Physical Exam Constitutional:      General: She  is not in acute distress.    Appearance: Normal appearance.  HENT:     Head: Normocephalic and atraumatic.  Eyes:     Conjunctiva/sclera: Conjunctivae normal.  Cardiovascular:     Rate and Rhythm: Normal rate and regular rhythm.     Heart sounds: Murmur (2/6 sys) heard.  Pulmonary:     Effort: Pulmonary effort is normal. No respiratory distress.     Breath sounds: Normal breath sounds. No wheezing.  Musculoskeletal:     Cervical back: Neck supple.     Right lower leg: No edema.     Left lower leg: No edema.  Lymphadenopathy:     Cervical: No cervical adenopathy.  Skin:    General: Skin is warm and dry.     Findings: No rash.  Neurological:     Mental Status: She is alert. Mental status is at baseline.  Psychiatric:        Mood and Affect: Mood normal.        Behavior: Behavior  normal.        Lab Results  Component Value Date   WBC 9.4 01/03/2023   HGB 11.4 (L) 01/03/2023   HCT 35.3 (L) 01/03/2023   PLT 296.0 01/03/2023   GLUCOSE 117 (H) 01/03/2023   CHOL 120 01/03/2023   TRIG 63.0 01/03/2023   HDL 31.90 (L) 01/03/2023   LDLCALC 76 01/03/2023   ALT 9 01/03/2023   AST 12 01/03/2023   NA 137 01/03/2023   K 4.4 01/03/2023   CL 103 01/03/2023   CREATININE 1.13 01/03/2023   BUN 20 01/03/2023   CO2 31 01/03/2023   TSH 2.22 01/12/2020   HGBA1C 5.6 01/03/2023   MICROALBUR <0.7 12/20/2021     Assessment & Plan:    See Problem List for Assessment and Plan of chronic medical problems.

## 2023-07-01 NOTE — Patient Instructions (Addendum)
      Blood work was ordered.       Medications changes include :   None    A referral was ordered and someone will call you to schedule an appointment.     Return in about 6 months (around 12/31/2023) for Physical Exam.

## 2023-07-02 ENCOUNTER — Ambulatory Visit: Payer: Medicare PPO | Admitting: Internal Medicine

## 2023-07-02 ENCOUNTER — Encounter: Payer: Self-pay | Admitting: Internal Medicine

## 2023-07-02 VITALS — BP 114/68 | HR 68 | Temp 98.5°F | Ht 67.0 in | Wt 231.0 lb

## 2023-07-02 DIAGNOSIS — D649 Anemia, unspecified: Secondary | ICD-10-CM | POA: Diagnosis not present

## 2023-07-02 DIAGNOSIS — E7849 Other hyperlipidemia: Secondary | ICD-10-CM

## 2023-07-02 DIAGNOSIS — E1122 Type 2 diabetes mellitus with diabetic chronic kidney disease: Secondary | ICD-10-CM

## 2023-07-02 DIAGNOSIS — I10 Essential (primary) hypertension: Secondary | ICD-10-CM

## 2023-07-02 DIAGNOSIS — M25561 Pain in right knee: Secondary | ICD-10-CM

## 2023-07-02 DIAGNOSIS — G8929 Other chronic pain: Secondary | ICD-10-CM

## 2023-07-02 DIAGNOSIS — N1831 Chronic kidney disease, stage 3a: Secondary | ICD-10-CM | POA: Diagnosis not present

## 2023-07-02 DIAGNOSIS — M3500 Sicca syndrome, unspecified: Secondary | ICD-10-CM | POA: Diagnosis not present

## 2023-07-02 DIAGNOSIS — J4520 Mild intermittent asthma, uncomplicated: Secondary | ICD-10-CM | POA: Diagnosis not present

## 2023-07-02 DIAGNOSIS — F419 Anxiety disorder, unspecified: Secondary | ICD-10-CM

## 2023-07-02 DIAGNOSIS — Z13820 Encounter for screening for osteoporosis: Secondary | ICD-10-CM

## 2023-07-02 DIAGNOSIS — F32A Depression, unspecified: Secondary | ICD-10-CM

## 2023-07-02 DIAGNOSIS — M25562 Pain in left knee: Secondary | ICD-10-CM

## 2023-07-02 LAB — LIPID PANEL
Cholesterol: 106 mg/dL (ref 0–200)
HDL: 32.5 mg/dL — ABNORMAL LOW (ref >39.00)
LDL Cholesterol: 63 mg/dL (ref 0–99)
NonHDL: 73.05
Total CHOL/HDL Ratio: 3
Triglycerides: 48 mg/dL (ref 0.0–149.0)
VLDL: 9.6 mg/dL (ref 0.0–40.0)

## 2023-07-02 LAB — CBC WITH DIFFERENTIAL/PLATELET
Basophils Absolute: 0.1 10*3/uL (ref 0.0–0.1)
Basophils Relative: 0.9 % (ref 0.0–3.0)
Eosinophils Absolute: 0.2 10*3/uL (ref 0.0–0.7)
Eosinophils Relative: 2.8 % (ref 0.0–5.0)
HCT: 35.7 % — ABNORMAL LOW (ref 36.0–46.0)
Hemoglobin: 11.7 g/dL — ABNORMAL LOW (ref 12.0–15.0)
Lymphocytes Relative: 16.6 % (ref 12.0–46.0)
Lymphs Abs: 1.2 10*3/uL (ref 0.7–4.0)
MCHC: 32.9 g/dL (ref 30.0–36.0)
MCV: 95.5 fL (ref 78.0–100.0)
Monocytes Absolute: 0.5 10*3/uL (ref 0.1–1.0)
Monocytes Relative: 7.5 % (ref 3.0–12.0)
Neutro Abs: 5.1 10*3/uL (ref 1.4–7.7)
Neutrophils Relative %: 72.2 % (ref 43.0–77.0)
Platelets: 253 10*3/uL (ref 150.0–400.0)
RBC: 3.73 Mil/uL — ABNORMAL LOW (ref 3.87–5.11)
RDW: 12.1 % (ref 11.5–15.5)
WBC: 7 10*3/uL (ref 4.0–10.5)

## 2023-07-02 LAB — COMPREHENSIVE METABOLIC PANEL
ALT: 9 U/L (ref 0–35)
AST: 13 U/L (ref 0–37)
Albumin: 3.6 g/dL (ref 3.5–5.2)
Alkaline Phosphatase: 120 U/L — ABNORMAL HIGH (ref 39–117)
BUN: 21 mg/dL (ref 6–23)
CO2: 27 meq/L (ref 19–32)
Calcium: 9.1 mg/dL (ref 8.4–10.5)
Chloride: 106 meq/L (ref 96–112)
Creatinine, Ser: 1.01 mg/dL (ref 0.40–1.20)
GFR: 50.26 mL/min — ABNORMAL LOW (ref >60.00)
Glucose, Bld: 96 mg/dL (ref 70–99)
Potassium: 4.1 meq/L (ref 3.5–5.1)
Sodium: 138 meq/L (ref 135–145)
Total Bilirubin: 1.1 mg/dL (ref 0.2–1.2)
Total Protein: 7.1 g/dL (ref 6.0–8.3)

## 2023-07-02 LAB — HEMOGLOBIN A1C: Hgb A1c MFr Bld: 5.3 % (ref 4.6–6.5)

## 2023-07-02 MED ORDER — LORAZEPAM 0.5 MG PO TABS: 0.5000 mg | ORAL_TABLET | ORAL | 2 refills | Status: DC | PRN

## 2023-07-02 NOTE — Assessment & Plan Note (Addendum)
Chronic Mild, stable Normocytic-likely anemia of chronic disease CBC

## 2023-07-02 NOTE — Assessment & Plan Note (Signed)
Chronic Controlled, Stable but feels anxious at night when she wakes in the middle of the night Continue sertraline 50 mg daily, lorazepam 0.5 mg daily as needed

## 2023-07-02 NOTE — Assessment & Plan Note (Signed)
Chronic Mild, intermittent Controlled Continue albuterol inhaler as needed 

## 2023-07-02 NOTE — Assessment & Plan Note (Signed)
Chronic Regular exercise and healthy diet encouraged Check lipid panel  Continue Crestor 10 mg daily 

## 2023-07-02 NOTE — Assessment & Plan Note (Signed)
Chronic Follows with orthopedics Does not want surgery-uses a cane to ambulate Has had injections in the past Was taking NSAIDs at 1 point which may have contributed to CKD-no longer taking ibuprofen Taking Tylenol, Voltaren gel Tramadol 50-100 mg Q 8 hrs as needed

## 2023-07-02 NOTE — Assessment & Plan Note (Signed)
Chronic  Lab Results  Component Value Date   HGBA1C 5.6 01/03/2023   Sugars well-controlled Continue Rybelsus 3 mg daily

## 2023-07-02 NOTE — Assessment & Plan Note (Signed)
New [facility] eye doc last week - very dry eyes - also mentioned very dry mouth - recommended cking for sjogrens ---  Will check sjogrens Ab panel

## 2023-07-02 NOTE — Assessment & Plan Note (Signed)
Chronic Blood pressure well controlled CMP Continue amlodipine 5 mg daily, losartan 100 mg daily 

## 2023-07-02 NOTE — Assessment & Plan Note (Addendum)
Chronic GFR has been fairly stable Avoid NSAIDs, increased water intake She has been taking the furosemide every other day-swelling is tolerable and she is hoping that will help her kidney function CMP

## 2023-07-03 LAB — SJOGRENS SYNDROME-A EXTRACTABLE NUCLEAR ANTIBODY: SSA (Ro) (ENA) Antibody, IgG: <1 AI

## 2023-07-03 LAB — SJOGRENS SYNDROME-B EXTRACTABLE NUCLEAR ANTIBODY: SSB (La) (ENA) Antibody, IgG: <1 AI

## 2023-07-10 ENCOUNTER — Other Ambulatory Visit: Payer: Self-pay | Admitting: Internal Medicine

## 2023-07-15 ENCOUNTER — Telehealth: Payer: Self-pay

## 2023-07-15 NOTE — Telephone Encounter (Signed)
TSF is now in valid as dated last visit was 6/21 and needs to be with in 6 months also CN were never received  I re-faxed all docs back to Dr. Lawerance Bach

## 2023-07-27 ENCOUNTER — Other Ambulatory Visit: Payer: Self-pay | Admitting: Internal Medicine

## 2023-07-28 ENCOUNTER — Ambulatory Visit (INDEPENDENT_AMBULATORY_CARE_PROVIDER_SITE_OTHER)
Admission: RE | Admit: 2023-07-28 | Discharge: 2023-07-28 | Disposition: A | Discharge status: Home / Self Care | Payer: Medicare PPO | Source: Ambulatory Visit | Attending: Internal Medicine | Admitting: Internal Medicine

## 2023-07-28 DIAGNOSIS — Z13820 Encounter for screening for osteoporosis: Secondary | ICD-10-CM

## 2023-08-14 ENCOUNTER — Telehealth: Payer: Self-pay

## 2023-08-14 NOTE — Telephone Encounter (Signed)
Copied from CRM [id number]. Topic: Clinical - Lab/Test Results >> Aug 14, 2023  2:15 PM Deaijah H wrote: Reason for CRM: Patient called in would like a hard copy of her lab work from 07/02/23 and bone density test results on 1/13 and also do not use mychart. & would like someone to explain abnormal results / please call [phone]

## 2023-08-14 NOTE — Telephone Encounter (Signed)
Pw has been put in the mail.

## 2023-08-20 ENCOUNTER — Telehealth: Payer: Self-pay

## 2023-08-20 ENCOUNTER — Telehealth: Payer: Self-pay | Admitting: Internal Medicine

## 2023-08-20 NOTE — Telephone Encounter (Signed)
 Called dr office, and they gave me the fax number which is [phone] for [name].

## 2023-08-20 NOTE — Telephone Encounter (Signed)
 Copied from CRM [id number]. Topic: General - Other >> Aug 20, 2023  9:30 AM Gerardine PARAS wrote: Reason for CRM: Barrett called from [facility] regarding paperwork being signed off on by Dr. Juanita for patients diabetic shoes, did provide fax number and also received call back number which is [phone] incase a call back is needed

## 2023-08-21 NOTE — Telephone Encounter (Signed)
 Form faxed today with clinical notes attached.

## 2023-08-25 ENCOUNTER — Emergency Department (HOSPITAL_COMMUNITY): Payer: Medicare PPO

## 2023-08-25 ENCOUNTER — Emergency Department (HOSPITAL_COMMUNITY)
Admission: EM | Admit: 2023-08-25 | Discharge: 2023-08-26 | Disposition: A | Discharge status: Home / Self Care | Payer: Medicare PPO | Attending: Emergency Medicine | Admitting: Emergency Medicine

## 2023-08-25 ENCOUNTER — Other Ambulatory Visit: Payer: Self-pay

## 2023-08-25 ENCOUNTER — Encounter (HOSPITAL_COMMUNITY): Payer: Self-pay

## 2023-08-25 DIAGNOSIS — J9811 Atelectasis: Secondary | ICD-10-CM | POA: Diagnosis not present

## 2023-08-25 DIAGNOSIS — Z79899 Other long term (current) drug therapy: Secondary | ICD-10-CM | POA: Diagnosis not present

## 2023-08-25 DIAGNOSIS — E119 Type 2 diabetes mellitus without complications: Secondary | ICD-10-CM | POA: Insufficient documentation

## 2023-08-25 DIAGNOSIS — R519 Headache, unspecified: Secondary | ICD-10-CM | POA: Diagnosis not present

## 2023-08-25 DIAGNOSIS — Z6836 Body mass index (BMI) 36.0-36.9, adult: Secondary | ICD-10-CM | POA: Diagnosis not present

## 2023-08-25 DIAGNOSIS — R9389 Abnormal findings on diagnostic imaging of other specified body structures: Secondary | ICD-10-CM | POA: Diagnosis not present

## 2023-08-25 DIAGNOSIS — I10 Essential (primary) hypertension: Secondary | ICD-10-CM | POA: Diagnosis not present

## 2023-08-25 DIAGNOSIS — R0602 Shortness of breath: Secondary | ICD-10-CM | POA: Diagnosis not present

## 2023-08-25 DIAGNOSIS — Z20822 Contact with and (suspected) exposure to covid-19: Secondary | ICD-10-CM | POA: Insufficient documentation

## 2023-08-25 DIAGNOSIS — Z7982 Long term (current) use of aspirin: Secondary | ICD-10-CM | POA: Diagnosis not present

## 2023-08-25 DIAGNOSIS — R0600 Dyspnea, unspecified: Secondary | ICD-10-CM | POA: Diagnosis not present

## 2023-08-25 DIAGNOSIS — E669 Obesity, unspecified: Secondary | ICD-10-CM | POA: Insufficient documentation

## 2023-08-25 DIAGNOSIS — I771 Stricture of artery: Secondary | ICD-10-CM | POA: Diagnosis not present

## 2023-08-25 DIAGNOSIS — N281 Cyst of kidney, acquired: Secondary | ICD-10-CM | POA: Diagnosis not present

## 2023-08-25 DIAGNOSIS — R6884 Jaw pain: Secondary | ICD-10-CM | POA: Diagnosis not present

## 2023-08-25 LAB — CBC
HCT: 37.9 % (ref 36.0–46.0)
Hemoglobin: 11.7 g/dL — ABNORMAL LOW (ref 12.0–15.0)
MCH: 30.1 pg (ref 26.0–34.0)
MCHC: 30.9 g/dL (ref 30.0–36.0)
MCV: 97.4 fL (ref 80.0–100.0)
Platelets: 248 10*3/uL (ref 150–400)
RBC: 3.89 MIL/uL (ref 3.87–5.11)
RDW: 11.7 % (ref 11.5–15.5)
WBC: 8.6 10*3/uL (ref 4.0–10.5)
nRBC: 0 % (ref 0.0–0.2)

## 2023-08-25 LAB — RESP PANEL BY RT-PCR (RSV, FLU A&B, COVID)  RVPGX2
Influenza A by PCR: NEGATIVE
Influenza B by PCR: NEGATIVE
Resp Syncytial Virus by PCR: NEGATIVE
SARS Coronavirus 2 by RT PCR: NEGATIVE

## 2023-08-25 LAB — BASIC METABOLIC PANEL
Anion gap: 9 (ref 5–15)
BUN: 19 mg/dL (ref 8–23)
CO2: 24 mmol/L (ref 22–32)
Calcium: 9.4 mg/dL (ref 8.9–10.3)
Chloride: 103 mmol/L (ref 98–111)
Creatinine, Ser: 1 mg/dL (ref 0.44–1.00)
GFR, Estimated: 55 mL/min — ABNORMAL LOW (ref >60)
Glucose, Bld: 96 mg/dL (ref 70–99)
Potassium: 3.5 mmol/L (ref 3.5–5.1)
Sodium: 136 mmol/L (ref 135–145)

## 2023-08-25 LAB — TROPONIN I (HIGH SENSITIVITY): Troponin I (High Sensitivity): 11 ng/L (ref <18)

## 2023-08-25 MED ORDER — IOHEXOL 350 MG/ML SOLN
100.0000 mL | Freq: Once | INTRAVENOUS | Status: AC | PRN
  Administered 2023-08-25: 100 mL via INTRAVENOUS

## 2023-08-25 MED ORDER — SODIUM CHLORIDE (PF) 0.9 % IJ SOLN
INTRAMUSCULAR | Status: AC
  Filled 2023-08-25: qty 50

## 2023-08-25 MED ORDER — HYDRALAZINE HCL 20 MG/ML IJ SOLN
10.0000 mg | Freq: Once | INTRAMUSCULAR | Status: AC
  Administered 2023-08-25: 10 mg via INTRAVENOUS
  Filled 2023-08-25: qty 1

## 2023-08-25 NOTE — Discharge Instructions (Addendum)
The cause of your symptoms was not identified today.  Please follow-up closely with your family doctor.  If your blood pressure continues to remain high at home, with blood pressures greater than 160 for the top number you may increase your amlodipine to 10 mg or 2 tablets once a day.  Get rechecked if you have new or concerning symptoms.

## 2023-08-25 NOTE — ED Provider Notes (Signed)
 [name] EMERGENCY DEPARTMENT AT [facility] Provider Note   CSN: [id number] Arrival date & time: 08/25/23  2019     History  Chief Complaint  Patient presents with   Shortness of Breath   Jaw Pain    [name] is a 89 y.o. female.  Pt is a 89 yo female with pmhx significant for htn, DM, hld, and anemia.  Pt has been sob for the past few days.  She also had some right upper jaw pain today.  She has no teeth, so does not think jaw pain is from her teeth.  She went to UC and was sent here for further eval.       Home Medications Prior to Admission medications   Medication Sig Start Date End Date Taking? Authorizing Provider  albuterol  (VENTOLIN  HFA) 108 (90 Base) MCG/ACT inhaler INHALE 2 PUFFS BY MOUTH EVERY 6 HOURS AS NEEDED FOR WHEEZING OR SHORTNESS OF BREATH 06/19/23   Colene Dauphin, MD  Alum Hydroxide-Mag Carbonate (GAVISCON PO) Take 1 Dose by mouth as directed.    [provider]  amLODipine  (NORVASC ) 5 MG tablet TAKE 1 TABLET(5 MG) BY MOUTH DAILY 05/05/23   Colene Dauphin, MD  aspirin 81 MG tablet Take 81 mg by mouth daily.    [provider]  Cholecalciferol (VITAMIN D3) 50 MCG (2000 UT) capsule Take 1 capsule (2,000 Units total) by mouth daily. 08/30/20   Plotnikov, Aleksei V, MD  cyclobenzaprine  (FLEXERIL ) 5 MG tablet Take 1 tablet (5 mg total) by mouth 3 (three) times daily as needed for muscle spasms. 07/19/22   Roslyn Coombe, MD  fluticasone  (FLONASE ) 50 MCG/ACT nasal spray SHAKE LIQUID AND USE 2 SPRAYS IN EACH NOSTRIL DAILY 05/09/23   Colene Dauphin, MD  furosemide  (LASIX ) 40 MG tablet TAKE 1 TABLET(40 MG) BY MOUTH DAILY 01/23/23   Colene Dauphin, MD  ketoconazole  (NIZORAL ) 2 % cream Apply 1 Application topically daily. Apply 1gm to bottom of feet daily. 02/13/23   McCaughan, Dia D, DPM  loratadine  (CLARITIN ) 10 MG tablet Take 1 tablet (10 mg total) by mouth daily. 10/03/17   Colene Dauphin, MD  LORazepam  (ATIVAN ) 0.5 MG tablet Take 1 tablet  (0.5 mg total) by mouth as needed for anxiety. TAKE 1 TABLET(0.5 MG) BY MOUTH DAILY AS NEEDED FOR ANXIETY 07/02/23   Burns, Beckey Bourgeois, MD  losartan  (COZAAR ) 100 MG tablet TAKE 1 TABLET(100 MG) BY MOUTH DAILY 07/11/23   Colene Dauphin, MD  rosuvastatin  (CRESTOR ) 10 MG tablet TAKE 1 TABLET(10 MG) BY MOUTH DAILY 01/20/23   Burns, Beckey Bourgeois, MD  Semaglutide  (RYBELSUS ) 3 MG TABS TAKE 1 TABLET BY MOUTH DAILY 30 MINUTES BEFORE BREAKFAST 04/01/23   Burns, Beckey Bourgeois, MD  sertraline  (ZOLOFT ) 50 MG tablet TAKE 1 TABLET(50 MG) BY MOUTH DAILY 01/23/23   Colene Dauphin, MD  traMADol  (ULTRAM ) 50 MG tablet Take 1-2 tablets (50-100 mg total) by mouth every 8 (eight) hours as needed (severe lower back pain). 07/28/23   Colene Dauphin, MD      Allergies    Latex    Review of Systems   Review of Systems  HENT:         Right jaw pain  Respiratory:  Positive for shortness of breath.   All other systems reviewed and are negative.   Physical Exam Updated Vital Signs BP (!) 187/76   Pulse (!) 58   Temp 98.1 F (36.7 C) (Oral)  Resp 16   Ht 5\' 7"  (1.702 m)   Wt 104.8 kg   SpO2 97%   BMI 36.19 kg/m  Physical Exam Vitals and nursing note reviewed.  Constitutional:      Appearance: She is well-developed. She is obese.  HENT:     Head: Normocephalic and atraumatic.     Mouth/Throat:     Mouth: Mucous membranes are moist.     Pharynx: Oropharynx is clear.  Eyes:     Extraocular Movements: Extraocular movements intact.     Pupils: Pupils are equal, round, and reactive to light.  Cardiovascular:     Rate and Rhythm: Normal rate and regular rhythm.  Pulmonary:     Effort: Pulmonary effort is normal.     Breath sounds: Normal breath sounds.  Abdominal:     General: Bowel sounds are normal.     Palpations: Abdomen is soft.  Musculoskeletal:        General: Normal range of motion.     Cervical back: Normal range of motion and neck supple.  Skin:    General: Skin is warm.     Capillary Refill: Capillary  refill takes less than 2 seconds.  Neurological:     General: No focal deficit present.     Mental Status: She is alert and oriented to person, place, and time.  Psychiatric:        Mood and Affect: Mood normal.        Behavior: Behavior normal.     ED Results / Procedures / Treatments   Labs (all labs ordered are listed, but only abnormal results are displayed) Labs Reviewed  BASIC METABOLIC PANEL - Abnormal; Notable for the following components:      Result Value   GFR, Estimated 55 (*)    All other components within normal limits  CBC - Abnormal; Notable for the following components:   Hemoglobin 11.7 (*)    All other components within normal limits  RESP PANEL BY RT-PCR (RSV, FLU A&B, COVID)  [id number]  TROPONIN I (HIGH SENSITIVITY)  TROPONIN I (HIGH SENSITIVITY)    EKG EKG Interpretation Date/Time:  Monday August 25 2023 20:45:12 EST Ventricular Rate:  84 PR Interval:  164 QRS Duration:  88 QT Interval:  459 QTC Calculation: 543 R Axis:   15  Text Interpretation: Ectopic atrial rhythm Low voltage, precordial leads Borderline T abnormalities, anterior leads Prolonged QT interval Confirmed by Sueellen Emery [phone]) on 08/25/2023 9:16:50 PM  Radiology CT Angio Chest PE W and/or Wo Contrast Result Date: 08/25/2023 CLINICAL DATA:  Shortness of breath which is worse upon exertion. EXAM: CT ANGIOGRAPHY CHEST WITH CONTRAST TECHNIQUE: Multidetector CT imaging of the chest was performed using the standard protocol during bolus administration of intravenous contrast. Multiplanar CT image reconstructions and MIPs were obtained to evaluate the vascular anatomy. RADIATION DOSE REDUCTION: This exam was performed according to the departmental dose-optimization program which includes automated exposure control, adjustment of the mA and/or kV according to patient size and/or use of iterative reconstruction technique. CONTRAST:  OMNIPAQUE  IOHEXOL  350 MG/ML SOLN COMPARISON:  July 14, 2017 FINDINGS: Cardiovascular: There is mild calcification of the aortic arch, without evidence of aortic aneurysm. Satisfactory opacification of the pulmonary arteries to the segmental level. No evidence of pulmonary embolism. Normal heart size. No pericardial effusion. Mediastinum/Nodes: Stable right hilar lymph nodes are seen. Thyroid  gland, trachea, and esophagus demonstrate no significant findings. Lungs/Pleura: Mild lingular, right middle lobe and posterior right basilar atelectasis is seen. There  is no evidence of an acute infiltrate, pleural effusion or pneumothorax. Upper Abdomen: A 5.6 cm diameter right renal cyst is noted. Musculoskeletal: No chest wall abnormality. No acute or significant osseous findings. Review of the MIP images confirms the above findings. IMPRESSION: 1. No evidence of pulmonary embolism or other acute intrathoracic process. 2. Mild lingular, right middle lobe and posterior right basilar atelectasis. 3. 5.6 cm diameter right renal cyst. No follow-up imaging is recommended. This recommendation follows ACR consensus guidelines: Management of the Incidental Renal Mass on CT: A White Paper of the ACR Incidental Findings Committee. J Am Coll Radiol [phone]. 4. Aortic atherosclerosis. Aortic Atherosclerosis (ICD10-I70.0). Electronically Signed   By: Virgle Grime M.D.   On: 08/25/2023 23:44   DG Chest 2 View Result Date: 08/25/2023 CLINICAL DATA:  Right jaw pain and shortness of breath. EXAM: CHEST - 2 VIEW COMPARISON:  July 19, 2022 FINDINGS: The heart size and mediastinal contours are within normal limits. There is tortuosity of the descending thoracic aorta. Mild to moderate severity elevation of the right hemidiaphragm is seen. Both lungs are clear. Multilevel degenerative changes are seen throughout the thoracic spine. IMPRESSION: No active cardiopulmonary disease. Electronically Signed   By: Virgle Grime M.D.   On: 08/25/2023 21:51     Procedures Procedures    Medications Ordered in ED Medications  hydrALAZINE  (APRESOLINE ) injection 10 mg (10 mg Intravenous Given 08/25/23 2334)  iohexol  (OMNIPAQUE ) 350 MG/ML injection 100 mL (100 mLs Intravenous Contrast Given 08/25/23 2303)    ED Course/ Medical Decision Making/ A&P                                 Medical Decision Making Amount and/or Complexity of Data Reviewed Labs: ordered. Radiology: ordered.  Risk Prescription drug management.   This patient presents to the ED for concern of sob, this involves an extensive number of treatment options, and is a complaint that carries with it a high risk of complications and morbidity.  The differential diagnosis includes anemia, electrolyte abn, cardiac, pulm   Co morbidities that complicate the patient evaluation  htn, DM, hld, and anemia   Additional history obtained:  Additional history obtained from epic chart review External records from outside source obtained and reviewed including family   Lab Tests:  I Ordered, and personally interpreted labs.  The pertinent results include:  cbc nl other than hgb 11.7 (chronic); trop nl, bmp nl   Imaging Studies ordered:  I ordered imaging studies including cxr and ct chest I independently visualized and interpreted imaging which showed  CXR: No active cardiopulmonary disease.  CT chest:  No evidence of pulmonary embolism or other acute intrathoracic  process.  2. Mild lingular, right middle lobe and posterior right basilar  atelectasis.  3. 5.6 cm diameter right renal cyst. No follow-up imaging is  recommended. This recommendation follows ACR consensus guidelines:  Management of the Incidental Renal Mass on CT: A White Paper of the  ACR Incidental Findings Committee. J Am Coll Radiol [phone].  4. Aortic atherosclerosis.    Aortic Atherosclerosis (ICD10-I70.0).   I agree with the radiologist interpretation   Cardiac Monitoring:  The  patient was maintained on a cardiac monitor.  I personally viewed and interpreted the cardiac monitored which showed an underlying rhythm of: nsr   Medicines ordered and prescription drug management:  I ordered medication including hydralazine   for htn  Reevaluation of the patient after these  medicines showed that the patient improved I have reviewed the patients home medicines and have made adjustments as needed   Test Considered:  ct   Critical Interventions:  ct   Problem List / ED Course:  SOB: sx improved.     Reevaluation:  After the interventions noted above, I reevaluated the patient and found that they have :improved   Social Determinants of Health:  Lives at home   Dispostion:  After consideration of the diagnostic results and the patients response to treatment, I feel that the patent would benefit from discharge with outpatient f/u.          Final Clinical Impression(s) / ED Diagnoses Final diagnoses:  Hypertension, unspecified type    Rx / DC Orders ED Discharge Orders     None         Sueellen Emery, MD 08/25/23 2358

## 2023-08-25 NOTE — ED Triage Notes (Signed)
 Sent by UC for possible ACS. Pt has right sided jaw pain, SOB that started a few days ago that is worse on exertion. Pt is diabetic and hypertensive.

## 2023-08-26 ENCOUNTER — Emergency Department (HOSPITAL_COMMUNITY): Payer: Medicare PPO

## 2023-08-26 ENCOUNTER — Ambulatory Visit: Payer: Medicare PPO | Admitting: Podiatry

## 2023-08-26 ENCOUNTER — Ambulatory Visit: Payer: Self-pay | Admitting: Internal Medicine

## 2023-08-26 DIAGNOSIS — R519 Headache, unspecified: Secondary | ICD-10-CM | POA: Diagnosis not present

## 2023-08-26 DIAGNOSIS — I10 Essential (primary) hypertension: Secondary | ICD-10-CM | POA: Diagnosis not present

## 2023-08-26 LAB — TROPONIN I (HIGH SENSITIVITY): Troponin I (High Sensitivity): 13 ng/L

## 2023-08-26 MED ORDER — METOCLOPRAMIDE HCL 5 MG/ML IJ SOLN
10.0000 mg | Freq: Once | INTRAMUSCULAR | Status: AC
  Administered 2023-08-26: 10 mg via INTRAVENOUS
  Filled 2023-08-26: qty 2

## 2023-08-26 MED ORDER — ACETAMINOPHEN 325 MG PO TABS
650.0000 mg | ORAL_TABLET | Freq: Once | ORAL | Status: AC
  Administered 2023-08-26: 650 mg via ORAL
  Filled 2023-08-26: qty 2

## 2023-08-26 MED ORDER — LABETALOL HCL 5 MG/ML IV SOLN
10.0000 mg | Freq: Once | INTRAVENOUS | Status: DC
  Filled 2023-08-26: qty 4

## 2023-08-26 MED ORDER — DIPHENHYDRAMINE HCL 50 MG/ML IJ SOLN
12.5000 mg | Freq: Once | INTRAMUSCULAR | Status: AC
  Administered 2023-08-26: 12.5 mg via INTRAVENOUS
  Filled 2023-08-26: qty 1

## 2023-08-26 NOTE — ED Notes (Signed)
Patient able to walk to the end of the nurses station closets to room 18 without being overally exerted. Patient granddaughter stated that the way patient is walking at this time is baseline for her. Patient did seem to be a little exerted upon sitting back down in the bed but was not as exerted as her earlier encounter when going tot he restroom

## 2023-08-26 NOTE — ED Notes (Signed)
Patient reported feeling anxious at this time. Provider aware

## 2023-08-26 NOTE — ED Notes (Signed)
Patient provided with warm blankets. Patient grandson and granddaughter at bedside.

## 2023-08-26 NOTE — ED Notes (Signed)
Pt ambulated from stretcher to restroom; notably labored breathing on return to bed. Pt took 2 puffs of inhaler after returning to room with some improvement of breathing

## 2023-08-26 NOTE — Telephone Encounter (Signed)
  Chief Complaint: hospital follow up  Symptoms: elevated bp and shortness of breath  Frequency: ongoing since yesterday  Pertinent Negatives: Patient denies other cardiac symptoms Disposition: [] ED /[] Urgent Care (no appt availability in office) / [x] Appointment(In office/virtual)/ []  [facility]/ [] Home Care/ [] Refused Recommended Disposition /[] [facility]/ []  Follow-up with PCP Additional Notes: The patient was seen in the ED yesterday and got home this morning at 5:30 am.  She received a full evaluation and was instructed to follow up with her pcp regarding her blood pressure medication management.  She's stated that her baseline is shortness of breath with exertion but yesterday was different, she said she could not breath.  She has a history of asthma and has been using her inhaler more frequently since last week.  Today she is back at her baseline of some shortness of breath with exertion.  She has a slight headache, ongoing since yesterday and her blood pressure was 161/87 but she had not taken her scheduled meds.  She was scheduled for a next day appointment for further evaluation per patient's request.   Reason for Disposition  Systolic BP  >= 160 OR Diastolic >= 100  Protocols used: Blood Pressure - High-A-AH

## 2023-08-26 NOTE — ED Provider Notes (Signed)
Called by nursing as patient was ready for discharge she came back from the bathroom with labored breathing.  On assessment at the bed she is breathing more comfortably with good air movement bilaterally.  She does complain of bilateral frontal headache and intermittent jaw pain today.  No associated chest pain.  She has been taking her blood pressure medicine as directed.  Plan to treat her headache as her blood pressure continues to be elevated and check a CT head.  CT head is negative for acute abnormality.  Her blood pressure did improve with treatment of her headache.  Current clinical picture is not consistent with hypertensive urgency.  After improvement of her symptoms that she was ambulated again and was found to be breathing at her baseline.  Feel she is stable for discharge home with outpatient resources.  Discussed that she should continue her blood pressure medications.  Discussed she can check her blood pressure at home and if it continues to remain elevated she may increase her amlodipine from 5 mg to 10 mg daily.  Discussed close outpatient follow-up and return precautions.   Tilden Fossa, MD 08/26/23 [phone]

## 2023-08-26 NOTE — ED Notes (Signed)
Reported from charge nurse that when patient was assisted to use the restroom she had a increase in her work of breathing. Per charge RN patient used her inhaler x 2 puffs and stated it made her feel better. Provider notified by Charge RN after speaking with primary nurse to see if it was baseline for patient.

## 2023-08-26 NOTE — ED Notes (Addendum)
Provider messaged about patient BP being 202/90 at this time. Patient asymptomatic with hx of hypertension. Patient to take her next dose of BP medication in the morning per patient

## 2023-08-26 NOTE — ED Notes (Signed)
Pt BP 176 81 provider notified and nurse inquired about administration of Labetalol

## 2023-08-27 ENCOUNTER — Ambulatory Visit (INDEPENDENT_AMBULATORY_CARE_PROVIDER_SITE_OTHER): Payer: Medicare PPO | Admitting: Family Medicine

## 2023-08-27 ENCOUNTER — Encounter: Payer: Self-pay | Admitting: Family Medicine

## 2023-08-27 VITALS — BP 136/70 | HR 88 | Temp 97.8°F | Ht 67.0 in | Wt 224.0 lb

## 2023-08-27 DIAGNOSIS — R600 Localized edema: Secondary | ICD-10-CM

## 2023-08-27 DIAGNOSIS — R0602 Shortness of breath: Secondary | ICD-10-CM

## 2023-08-27 DIAGNOSIS — R0609 Other forms of dyspnea: Secondary | ICD-10-CM

## 2023-08-27 DIAGNOSIS — R5383 Other fatigue: Secondary | ICD-10-CM | POA: Diagnosis not present

## 2023-08-27 LAB — COMPREHENSIVE METABOLIC PANEL
ALT: 11 U/L (ref 0–35)
AST: 16 U/L (ref 0–37)
Albumin: 4.2 g/dL (ref 3.5–5.2)
Alkaline Phosphatase: 108 U/L (ref 39–117)
BUN: 16 mg/dL (ref 6–23)
CO2: 30 meq/L (ref 19–32)
Calcium: 9.6 mg/dL (ref 8.4–10.5)
Chloride: 102 meq/L (ref 96–112)
Creatinine, Ser: 1.19 mg/dL (ref 0.40–1.20)
GFR: 41.24 mL/min — ABNORMAL LOW (ref >60.00)
Glucose, Bld: 106 mg/dL — ABNORMAL HIGH (ref 70–99)
Potassium: 3.8 meq/L (ref 3.5–5.1)
Sodium: 139 meq/L (ref 135–145)
Total Bilirubin: 1.5 mg/dL — ABNORMAL HIGH (ref 0.2–1.2)
Total Protein: 7.6 g/dL (ref 6.0–8.3)

## 2023-08-27 LAB — BRAIN NATRIURETIC PEPTIDE: Pro B Natriuretic peptide (BNP): 23 pg/mL (ref 0.0–100.0)

## 2023-08-27 NOTE — Patient Instructions (Signed)
We are checking labs today, will be in contact with any results that require further attention  Take your lasix daily as prescribed, drink pedialyte as needed.  I have sent in a referral to see Dr Anne Fu again as well.   Follow-up with me for new or worsening symptoms.

## 2023-08-27 NOTE — Progress Notes (Signed)
   Acute Office Visit  Subjective:     Patient ID: [name], female    DOB: 02-13-1935, 89 y.o.   MRN: [id number]  Chief Complaint  Patient presents with   Hospitalization Follow-up    Still experiencing SOB, not getting any better. Inhaler does help, is having to use it about every 3-4 hours    HPI Patient is in today for ER follow up.  She is accompanied by her son today. Was seen in the ER for SOB and HTN.  Had not taken her BP meds.  ER notes reviewed by me. Negative head CT yesterday. No new medication changes, discharged without admission. Reports DOE, lower leg edema bilaterally, decreased UOP until yesterday. Reports that she has been using her inhaler for SOB, did take her lasix yesterday and drank pedialyte to help her urinate more. Denies sick contacts. Denies abdominal pain, nausea, diarrhea, rash, fever and chills management. Medical history as outlined below.  ROS Per HPI      Objective:    BP 136/70 (BP Location: Left Arm, Patient Position: Sitting)   Pulse 88   Temp 97.8 F (36.6 C) (Oral)   Ht 5\' 7"  (1.702 m)   Wt 224 lb (101.6 kg)   SpO2 96%   BMI 35.08 kg/m    Physical Exam Vitals and nursing note reviewed.  Constitutional:      General: She is not in acute distress.    Appearance: Normal appearance. She is obese.  HENT:     Head: Normocephalic and atraumatic.     Nose: Nose normal. No congestion.  Eyes:     Extraocular Movements: Extraocular movements intact.  Cardiovascular:     Rate and Rhythm: Normal rate and regular rhythm.     Heart sounds: Murmur heard.  Pulmonary:     Effort: Pulmonary effort is normal. No respiratory distress.     Breath sounds: Normal breath sounds. No wheezing, rhonchi or rales.  Musculoskeletal:        General: Normal range of motion.     Cervical back: Normal range of motion.     Right lower leg: Edema (+1, nonpitting) present.     Left lower leg: Edema (+1, nonpitting) present.  Lymphadenopathy:      Cervical: No cervical adenopathy.  Skin:    General: Skin is warm and dry.  Neurological:     General: No focal deficit present.     Mental Status: She is alert and oriented to person, place, and time.  Psychiatric:        Mood and Affect: Mood normal.        Thought Content: Thought content normal.    No results found for any visits on 08/27/23.      Assessment & Plan:  1. SOB (shortness of breath) (Primary)  - Comprehensive metabolic panel - Brain natriuretic peptide - Ambulatory referral to Cardiology  2. DOE (dyspnea on exertion)  - Comprehensive metabolic panel - Brain natriuretic peptide - Ambulatory referral to Cardiology  3. Other fatigue  - Comprehensive metabolic panel - Brain natriuretic peptide - Ambulatory referral to Cardiology  4. Lower leg edema  - Comprehensive metabolic panel - Brain natriuretic peptide - Ambulatory referral to Cardiology    No orders of the defined types were placed in this encounter.   Return if symptoms worsen or fail to improve.  Moshe Cipro, FNP

## 2023-08-28 ENCOUNTER — Other Ambulatory Visit: Payer: Self-pay | Admitting: Internal Medicine

## 2023-09-02 ENCOUNTER — Inpatient Hospital Stay: Payer: Medicare PPO | Admitting: Internal Medicine

## 2023-09-18 ENCOUNTER — Encounter: Payer: Self-pay | Admitting: Podiatry

## 2023-09-18 ENCOUNTER — Ambulatory Visit: Payer: Medicare PPO | Admitting: Podiatry

## 2023-09-18 VITALS — Ht 67.0 in | Wt 224.0 lb

## 2023-09-18 DIAGNOSIS — E1151 Type 2 diabetes mellitus with diabetic peripheral angiopathy without gangrene: Secondary | ICD-10-CM

## 2023-09-18 DIAGNOSIS — B351 Tinea unguium: Secondary | ICD-10-CM

## 2023-09-18 DIAGNOSIS — M79675 Pain in left toe(s): Secondary | ICD-10-CM | POA: Diagnosis not present

## 2023-09-18 DIAGNOSIS — M79674 Pain in right toe(s): Secondary | ICD-10-CM | POA: Diagnosis not present

## 2023-09-18 NOTE — Progress Notes (Signed)
This patient returns to my office for at risk foot care.  This patient requires this care by a professional since this patient will be at risk due to having diabetes and CKD.  This patient is unable to cut nails herself since the patient cannot reach her nails.These nails are painful walking and wearing shoes.  This patient presents for at risk foot care today.  General Appearance  Alert, conversant and in no acute stress.  Vascular  Dorsalis pedis and posterior tibial  pulses are  weakly palpable  bilaterally.  Capillary return is within normal limits  bilaterally. Temperature is within normal limits  bilaterally.  Neurologic  Senn-Weinstein monofilament wire test within normal limits  bilaterally. Muscle power within normal limits bilaterally.  Nails Thick disfigured discolored nails with subungual debris  from hallux to fifth toes bilaterally. No evidence of bacterial infection or drainage bilaterally.  Orthopedic  No limitations of motion  feet .  No crepitus or effusions noted.  No bony pathology or digital deformities noted.  Skin  normotropic skin with no porokeratosis noted bilaterally.  No signs of infections or ulcers noted.     Onychomycosis  Pain in right toes  Pain in left toes  Consent was obtained for treatment procedures.   Mechanical debridement of nails 1-5  bilaterally performed with a nail nipper.  Filed with dremel without incident.    Return office visit     3 months                 Told patient to return for periodic foot care and evaluation due to potential at risk complications.   Gardiner Barefoot DPM

## 2023-09-22 ENCOUNTER — Encounter: Payer: Self-pay | Admitting: Nurse Practitioner

## 2023-09-22 ENCOUNTER — Ambulatory Visit: Attending: Nurse Practitioner | Admitting: Nurse Practitioner

## 2023-09-22 VITALS — BP 106/60 | HR 68 | Ht 66.0 in | Wt 219.2 lb

## 2023-09-22 DIAGNOSIS — I10 Essential (primary) hypertension: Secondary | ICD-10-CM

## 2023-09-22 DIAGNOSIS — Z87898 Personal history of other specified conditions: Secondary | ICD-10-CM | POA: Diagnosis not present

## 2023-09-22 DIAGNOSIS — D649 Anemia, unspecified: Secondary | ICD-10-CM

## 2023-09-22 DIAGNOSIS — R0609 Other forms of dyspnea: Secondary | ICD-10-CM

## 2023-09-22 DIAGNOSIS — E785 Hyperlipidemia, unspecified: Secondary | ICD-10-CM

## 2023-09-22 DIAGNOSIS — E1122 Type 2 diabetes mellitus with diabetic chronic kidney disease: Secondary | ICD-10-CM | POA: Diagnosis not present

## 2023-09-22 DIAGNOSIS — N1831 Chronic kidney disease, stage 3a: Secondary | ICD-10-CM | POA: Diagnosis not present

## 2023-09-22 NOTE — Patient Instructions (Signed)
 Medication Instructions:  Your physician recommends that you continue on your current medications as directed. Please refer to the Current Medication list given to you today.  *If you need a refill on your cardiac medications before your next appointment, please call your pharmacy*   Lab Work: NONE ordered at this time of appointment   Testing/Procedures: Your physician has requested that you have an echocardiogram. Echocardiography is a painless test that uses sound waves to create images of your heart. It provides your doctor with information about the size and shape of your heart and how well your heart's chambers and valves are working. This procedure takes approximately one hour. There are no restrictions for this procedure. Please do NOT wear cologne, perfume, aftershave, or lotions (deodorant is allowed). Please arrive 15 minutes prior to your appointment time.  Please note: We ask at that you not bring children with you during ultrasound (echo/ vascular) testing. Due to room size and safety concerns, children are not allowed in the ultrasound rooms during exams. Our front office staff cannot provide observation of children in our lobby area while testing is being conducted. An adult accompanying a patient to their appointment will only be allowed in the ultrasound room at the discretion of the ultrasound technician under special circumstances. We apologize for any inconvenience.    Follow-Up: At [facility], you and your health needs are our priority.  As part of our continuing mission to provide you with exceptional heart care, we have created designated Provider Care Teams.  These Care Teams include your primary Cardiologist (physician) and Advanced Practice Providers (APPs -  Physician Assistants and Nurse Practitioners) who all work together to provide you with the care you need, when you need it.  We recommend signing up for the patient portal called "MyChart".  Sign up  information is provided on this After Visit Summary.  MyChart is used to connect with patients for Virtual Visits (Telemedicine).  Patients are able to view lab/test results, encounter notes, upcoming appointments, etc.  Non-urgent messages can be sent to your provider as well.   To learn more about what you can do with MyChart, go to [email or web address].    Your next appointment:   2-3 month(s)  Provider:   Donato Schultz, MD     Other Instructions      1st Floor: - Lobby - Registration  - Pharmacy  - Lab - Cafe  2nd Floor: - PV Lab - Diagnostic Testing (echo, CT, nuclear med)  3rd Floor: - Vacant  4th Floor: - TCTS (cardiothoracic surgery) - AFib Clinic - Structural Heart Clinic - Vascular Surgery  - Vascular Ultrasound  5th Floor: - [facility] (general and EP) - Clinical Pharmacy for coumadin, hypertension, lipid, weight-loss medications, and med management appointments    Valet parking services will be available as well.

## 2023-09-22 NOTE — Progress Notes (Unsigned)
 Office Visit    Patient Name: [name] Date of Encounter: 09/22/2023  Primary Care Provider:  Pincus Sanes, MD Primary Cardiologist:  Donato Schultz, MD  Chief Complaint    89 year old female with a history of dyspnea on exertion, hypertension, hyperlipidemia, and type 2 diabetes,   Past Medical History    Past Medical History:  Diagnosis Date   Allergy    Anemia    Asthma    adult onset   Carotid bruit    left   Hyperlipidemia    LDL goal = < 120 based on NMR Lipoprofile 2005   Hypertension    Metabolic syndrome    Past Surgical History:  Procedure Laterality Date   CARDIAC CATHETERIZATION  2006   negative   CATARACT EXTRACTION, BILATERAL     Dr Hazle Quant   COLONOSCOPY  06/02/2006   Dr.Kaplan,Next Due date 06/02/2016   SHOULDER SURGERY  11/21/2008   Dr.Aplington   TOTAL ABDOMINAL HYSTERECTOMY W/ BILATERAL SALPINGOOPHORECTOMY  1983   Dr.Fore, for fibroids with pain   TOTAL HIP ARTHROPLASTY  2005, 2006    Allergies  Allergies  Allergen Reactions   Latex Hives     Labs/Other Studies Reviewed    The following studies were reviewed today:  Cardiac Studies & Procedures   ______________________________________________________________________________________________   STRESS TESTS  MYOCARDIAL PERFUSION IMAGING 12/04/2016  Narrative  The left ventricular ejection fraction is hyperdynamic (>65%).  Nuclear stress EF: 66%.  There was no ST segment deviation noted during stress.  No T wave inversion was noted during stress.  The study is normal.  This is a low risk study.  Breast attenuation artifact noted.   ECHOCARDIOGRAM  ECHOCARDIOGRAM COMPLETE 12/03/2016  Narrative *[facility]* [address] [place], [place] [address] [phone]  ------------------------------------------------------------------- Echocardiography  Patient:    Emmry, Hinsch MR #:       [id number] Study Date: 12/03/2016 Gender:     F Age:         82 Height:     167.6 cm Weight:     110.7 kg BSA:        2.32 m^2 Pt. Status: Room:  Althea Grimmer, M.D. REFERRING    Donato Schultz, M.D. SONOGRAPHER  Aida Raider, RDCS PERFORMING   [facility], Outpatient ATTENDING    Chilton Si, MD  cc:  ------------------------------------------------------------------- LV EF: 60% -   65%  ------------------------------------------------------------------- Indications:      R06.00 Dyspnea.  I42.9 Cardiomyopathy.  ------------------------------------------------------------------- History:   PMH:  Acquired from the patient and from the patient&'s chart.  PMH:  Chronic Fatigue. Dyspnea on exertion. Bradycardia. Anemia. Left Carotid Bruit.  Risk factors:  Hypertension. Dyslipidemia.  ------------------------------------------------------------------- Study Conclusions  - Left ventricle: The cavity size was normal. There was mild concentric hypertrophy. Systolic function was normal. The estimated ejection fraction was in the range of 60% to 65%. Wall motion was normal; there were no regional wall motion abnormalities. Doppler parameters are consistent with abnormal left ventricular relaxation (grade 1 diastolic dysfunction). Doppler parameters are consistent with high ventricular filling pressure. - Mitral valve: Calcified annulus. - Right ventricle: The cavity size was normal. Wall thickness was normal. - Atrial septum: No defect or patent foramen ovale was identified by color flow Doppler. - Tricuspid valve: There was trivial regurgitation. - Pulmonary arteries: Systolic pressure was within the normal range. PA peak pressure: 24 mm Hg (S).  ------------------------------------------------------------------- Study data:   Study status:  Routine.  Procedure:  The patient reported no pain pre or post test. Transthoracic echocardiography for left ventricular function evaluation, for right ventricular function evaluation,  and for assessment of valvular function. Image quality was adequate.  Study completion:  There were no complications.          Echocardiography.  M-mode, complete 2D, spectral Doppler, and color Doppler.  Birthdate:  Patient birthdate: 1934/09/08.  Age:  Patient is 89 yr old.  Sex:  Gender: female.    BMI: 39.4 kg/m^2.  Blood pressure:     110/60  Patient status:  Outpatient.  Study date:  Study date: 12/03/2016. Study time: 11:39 AM.  Location:  [facility]  -------------------------------------------------------------------  ------------------------------------------------------------------- Left ventricle:  The cavity size was normal. There was mild concentric hypertrophy. Systolic function was normal. The estimated ejection fraction was in the range of 60% to 65%. Wall motion was normal; there were no regional wall motion abnormalities. Doppler parameters are consistent with abnormal left ventricular relaxation (grade 1 diastolic dysfunction). Doppler parameters are consistent with high ventricular filling pressure.  ------------------------------------------------------------------- Aortic valve:   Trileaflet; mildly thickened, mildly calcified leaflets. Mobility was not restricted.  Doppler:  Transvalvular velocity was within the normal range. There was no stenosis. There was no regurgitation.  ------------------------------------------------------------------- Aorta:  Aortic root: The aortic root was normal in size. Ascending aorta: The ascending aorta was mildly dilated.  ------------------------------------------------------------------- Mitral valve:   Calcified annulus. Mobility was not restricted. Doppler:  Transvalvular velocity was within the normal range. There was no evidence for stenosis. There was trivial regurgitation. Peak gradient (D): 4 mm Hg.  ------------------------------------------------------------------- Left atrium:  The atrium was normal in  size.  ------------------------------------------------------------------- Atrial septum:  No defect or patent foramen ovale was identified by color flow Doppler.  ------------------------------------------------------------------- Right ventricle:  The cavity size was normal. Wall thickness was normal. Systolic function was normal.  ------------------------------------------------------------------- Pulmonic valve:    Structurally normal valve.   Cusp separation was normal.  Doppler:  Transvalvular velocity was within the normal range. There was no evidence for stenosis. There was mild regurgitation.  ------------------------------------------------------------------- Tricuspid valve:   Structurally normal valve.    Doppler: Transvalvular velocity was within the normal range. There was trivial regurgitation.  ------------------------------------------------------------------- Pulmonary artery:   The main pulmonary artery was normal-sized. Systolic pressure was within the normal range.  ------------------------------------------------------------------- Right atrium:  The atrium was normal in size.  ------------------------------------------------------------------- Pericardium:  There was no pericardial effusion.  ------------------------------------------------------------------- Systemic veins: Inferior vena cava: The vessel was normal in size. The respirophasic diameter changes were in the normal range (>= 50%), consistent with normal central venous pressure.  ------------------------------------------------------------------- Measurements  Left ventricle                         Value        Reference LV ID, ED, PLAX chordal                50    mm     43 - 52 LV ID, ES, PLAX chordal                28.1  mm     23 - 38 LV fx shortening, PLAX chordal         44    %      >=29 LV PW thickness, ED                    12.5  mm     --------- IVS/LV PW ratio, ED                     1            <=1.3 Stroke volume, 2D                      106   ml     --------- Stroke volume/bsa, 2D                  46    ml/m^2 --------- LV e&', lateral                         5.48  cm/s   --------- LV E/e&', lateral                       17.52        --------- LV e&', medial                          5.81  cm/s   --------- LV E/e&', medial                        16.52        --------- LV e&', average                         5.65  cm/s   --------- LV E/e&', average                       17.01        ---------  Ventricular septum                     Value        Reference IVS thickness, ED                      12.5  mm     ---------  LVOT                                   Value        Reference LVOT ID, S                             21    mm     --------- LVOT area                              3.46  cm^2   --------- LVOT ID                                21    mm     --------- LVOT peak velocity, S                  110   cm/s   --------- LVOT mean velocity, S                  81.2  cm/s   --------- LVOT VTI, S  30.5  cm     --------- LVOT peak gradient, S                  5     mm Hg  --------- Stroke volume (SV), LVOT DP            105.6 ml     --------- Stroke index (SV/bsa), LVOT DP         45.5  ml/m^2 ---------  Aorta                                  Value        Reference Aortic root ID, ED                     34    mm     --------- Ascending aorta ID, A-P, S             37    mm     ---------  Left atrium                            Value        Reference LA ID, A-P, ES                         46    mm     --------- LA ID/bsa, A-P                         1.98  cm/m^2 <=2.2 LA volume, S                           58    ml     --------- LA volume/bsa, S                       25    ml/m^2 --------- LA volume, ES, 1-p A4C                 47    ml     --------- LA volume/bsa, ES, 1-p A4C             20.2  ml/m^2 --------- LA volume, ES, 1-p A2C                  70    ml     --------- LA volume/bsa, ES, 1-p A2C             30.1  ml/m^2 ---------  Mitral valve                           Value        Reference Mitral E-wave peak velocity            96    cm/s   --------- Mitral A-wave peak velocity            121   cm/s   --------- Mitral deceleration time       (H)     282   ms     150 - 230 Mitral peak gradient, D                4     mm Hg  ---------  Mitral E/A ratio, peak                 0.8          ---------  Pulmonary arteries                     Value        Reference PA pressure, S, DP                     24    mm Hg  <=30  Tricuspid valve                        Value        Reference Tricuspid regurg peak velocity         227   cm/s   --------- Tricuspid peak RV-RA gradient          21    mm Hg  ---------  Systemic veins                         Value        Reference Estimated CVP                          3     mm Hg  ---------  Right ventricle                        Value        Reference RV pressure, S, DP                     24    mm Hg  <=30 RV s&', lateral, S                      14.1  cm/s   ---------  Legend: (L)  and  (H)  mark values outside specified reference range.  ------------------------------------------------------------------- Prepared and Electronically Authenticated by  Chilton Si, MD 2018-05-22T17:05:13          ______________________________________________________________________________________________     Recent Labs: 08/25/2023: Hemoglobin 11.7; Platelets 248 08/27/2023: ALT 11; BUN 16; Creatinine, Ser 1.19; Potassium 3.8; Pro B Natriuretic peptide (BNP) 23.0; Sodium 139  Recent Lipid Panel    Component Value Date/Time   CHOL 106 07/02/2023 1109   TRIG 48.0 07/02/2023 1109   TRIG 47 06/27/2006 1047   HDL 32.50 (L) 07/02/2023 1109   CHOLHDL 3 07/02/2023 1109   VLDL 9.6 07/02/2023 1109   LDLCALC 63 07/02/2023 1109    History of Present Illness    89 year old female  with a history of dyspnea on exertion, hypertension, hyperlipidemia, CKD stage IIIb, type 2 diabetes, anemia, and asthma.    He was previously evaluated by Dr. Anne Fu in 2018 in the setting of dyspnea on exertion, she was last seen in the office in 2019.  Stress test 2018 low risk, no evidence of ischemia, Echocardiogram showed EF 60-65%, mild concentric LVH, no RWMA, G1DD, no significant valvular abnormalities. She was evaluated in the ED on 08/25/2023 for shortness of breath, jaw pain. Troponin was negative x 2. Chest x-ray was negative for acute cardiopulmonary disease did show tortuosity of the descending thoracic aorta. CT of the chest was negative for PE, there was evidence of aortic atherosclerosis. Saw her PCP and noted ongoing shortness of breath with  exertion, lower extremity edema.  BNP was normal.  She was referred to cardiology for further evaluation.   She presents today for follow-up accompanied by her daughter. She is technically a new patient as she has not been seen since 2019.  She notes ongoing shortness of breath with activity, she denies chest pain though she does note intermittent right shoulder pain at rest.  She has been using her inhaler more frequently, without improvement in her symptoms.  She also notes generalized fatigue.  She denies palpitations, dizziness, PND, orthopnea, weight gain.  Home Medications    Current Outpatient Medications  Medication Sig Dispense Refill   albuterol (VENTOLIN HFA) 108 (90 Base) MCG/ACT inhaler INHALE 2 PUFFS BY MOUTH EVERY 6 HOURS AS NEEDED FOR WHEEZING OR SHORTNESS OF BREATH 18 g 5   Alum Hydroxide-Mag Carbonate (GAVISCON PO) Take 1 Dose by mouth as directed.     amLODipine (NORVASC) 5 MG tablet TAKE 1 TABLET(5 MG) BY MOUTH DAILY 90 tablet 1   aspirin 81 MG tablet Take 81 mg by mouth daily.     Cholecalciferol (VITAMIN D3) 50 MCG (2000 UT) capsule Take 1 capsule (2,000 Units total) by mouth daily. 100 capsule 3   cyclobenzaprine  (FLEXERIL) 5 MG tablet Take 1 tablet (5 mg total) by mouth 3 (three) times daily as needed for muscle spasms. 30 tablet 1   fluticasone (FLONASE) 50 MCG/ACT nasal spray SHAKE LIQUID AND USE 2 SPRAYS IN EACH NOSTRIL DAILY 48 g 1   furosemide (LASIX) 40 MG tablet TAKE 1 TABLET(40 MG) BY MOUTH DAILY 90 tablet 1   ketoconazole (NIZORAL) 2 % cream Apply 1 Application topically daily. Apply 1gm to bottom of feet daily. 60 g 2   loratadine (CLARITIN) 10 MG tablet Take 1 tablet (10 mg total) by mouth daily. 30 tablet 11   LORazepam (ATIVAN) 0.5 MG tablet Take 1 tablet (0.5 mg total) by mouth as needed for anxiety. TAKE 1 TABLET(0.5 MG) BY MOUTH DAILY AS NEEDED FOR ANXIETY 30 tablet 2   losartan (COZAAR) 100 MG tablet TAKE 1 TABLET(100 MG) BY MOUTH DAILY 90 tablet 3   rosuvastatin (CRESTOR) 10 MG tablet TAKE 1 TABLET(10 MG) BY MOUTH DAILY 90 tablet 3   Semaglutide (RYBELSUS) 3 MG TABS TAKE 1 TABLET BY MOUTH DAILY 30 MINUTES BEFORE BREAKFAST 90 tablet 2   sertraline (ZOLOFT) 50 MG tablet TAKE 1 TABLET(50 MG) BY MOUTH DAILY 90 tablet 1   traMADol (ULTRAM) 50 MG tablet Take 1-2 tablets (50-100 mg total) by mouth every 8 (eight) hours as needed for severe pain (pain score 7-10). 30 tablet 0   No current facility-administered medications for this visit.     Review of Systems    She denies chest pain, palpitations, pnd, orthopnea, n, v, dizziness, syncope, weight gain, or early satiety. All other systems reviewed and are otherwise negative except as noted above.   Physical Exam    VS:  BP 106/60 (BP Location: Right Arm, Patient Position: Sitting, Cuff Size: Normal)   Pulse 68   Ht 5\' 6"  (1.676 m)   Wt 219 lb 3.2 oz (99.4 kg)   SpO2 95%   BMI 35.38 kg/m  GEN: Well nourished, well developed, in no acute distress. HEENT: normal. Neck: Supple, no JVD, carotid bruits, or masses. Cardiac: RRR, no murmurs, rubs, or gallops. No clubbing, cyanosis, edema.  Radials/DP/PT 2+ and equal bilaterally.   Respiratory:  Respirations regular and unlabored, clear to auscultation bilaterally. GI: Soft, nontender, nondistended, BS + x  4. MS: no deformity or atrophy. Skin: warm and dry, no rash. Neuro:  Strength and sensation are intact. Psych: Normal affect.  Accessory Clinical Findings    ECG personally reviewed by me today - EKG Interpretation Date/Time:  Monday September 22 2023 13:36:50 EDT Ventricular Rate:  59 PR Interval:  198 QRS Duration:  76 QT Interval:  414 QTC Calculation: 409 R Axis:   -4  Text Interpretation: Sinus bradycardia Nonspecific T wave abnormality When compared with ECG of 25-Aug-2023 20:45, PREVIOUS ECG IS PRESENT Confirmed by Bernadene Person ([id number]) on 09/22/2023 1:41:18 PM  - no acute changes.   Lab Results  Component Value Date   WBC 8.6 08/25/2023   HGB 11.7 (L) 08/25/2023   HCT 37.9 08/25/2023   MCV 97.4 08/25/2023   PLT 248 08/25/2023   Lab Results  Component Value Date   CREATININE 1.19 08/27/2023   BUN 16 08/27/2023   NA 139 08/27/2023   K 3.8 08/27/2023   CL 102 08/27/2023   CO2 30 08/27/2023   Lab Results  Component Value Date   ALT 11 08/27/2023   AST 16 08/27/2023   ALKPHOS 108 08/27/2023   BILITOT 1.5 (H) 08/27/2023   Lab Results  Component Value Date   CHOL 106 07/02/2023   HDL 32.50 (L) 07/02/2023   LDLCALC 63 07/02/2023   TRIG 48.0 07/02/2023   CHOLHDL 3 07/02/2023    Lab Results  Component Value Date   HGBA1C 5.3 07/02/2023    Assessment & Plan    1. Dyspnea on exertion: Stress test 2018 low risk, no evidence of ischemia, Echocardiogram showed EF 60-65%, mild concentric LVH, no RWMA, G1DD, no significant valvular abnormalities. She notes approximately a 1 month history of shortness of breath with minimal activity.  Recent ED visit for same, troponin was negative x 2, chest x-ray was unremarkable, CT of the chest was negative for PE.  Recent BNP was normal. CBC was stable (she does have chronic anemia).  She has been using her  inhaler more frequently without improvement in her symptoms.  She also notes generalized fatigue.  She denies chest pain.  Euvolemic and well compensated on exam. We discussed possible ischemic evaluation with coronary CT angiogram versus stress test, as well as possible echocardiogram.  She declines ischemic evaluation at this time. She is agreeable to proceed with echocardiogram. Reviewed ED precautions.  2. Lower extremity edema: She reports recent lower extremity edema, not appreciated on exam today.  Recent BNP was normal.  Echocardiogram pending as above.  Continue Lasix.  3. Hypertension: BP well controlled borderline low, continue to monitor BP.  For now,  continue current antihypertensive regimen.   4. Hyperlipidemia: LDL was 63 in 06/2023.  Continue Crestor.  5. Type 2 diabetes: A1c was 5.3 in 06/2023.  Monitored and managed per PCP.  6. Anemia: Recent CBC revealed hemoglobin 11.7, stable.  7. CKD stage IIIb: Creatinine was 1.19 on 08/27/2023, EGFR 41.24.  Has been stable over the last year.  8. History of snoring: She notes generalized fatigue.  She does report a history of snoring.  Consider need for possible evaluation for sleep apnea at follow-up.  9. Disposition: Follow-up in 2 to 3 months with Dr. Anne Fu.       Joylene Grapes, NP 09/22/2023, 1:48 PM

## 2023-09-23 ENCOUNTER — Encounter: Payer: Self-pay | Admitting: Nurse Practitioner

## 2023-09-25 ENCOUNTER — Other Ambulatory Visit: Payer: Self-pay | Admitting: Nurse Practitioner

## 2023-10-10 ENCOUNTER — Other Ambulatory Visit: Payer: Self-pay | Admitting: Internal Medicine

## 2023-10-26 ENCOUNTER — Other Ambulatory Visit: Payer: Self-pay | Admitting: Internal Medicine

## 2023-10-29 ENCOUNTER — Ambulatory Visit: Payer: Medicare PPO | Admitting: Cardiology

## 2023-11-03 ENCOUNTER — Other Ambulatory Visit: Payer: Self-pay | Admitting: Internal Medicine

## 2023-11-04 ENCOUNTER — Ambulatory Visit (HOSPITAL_COMMUNITY)
Admission: RE | Admit: 2023-11-04 | Discharge: 2023-11-04 | Disposition: A | Discharge status: Home / Self Care | Source: Ambulatory Visit | Attending: Cardiology | Admitting: Cardiology

## 2023-11-04 DIAGNOSIS — R0609 Other forms of dyspnea: Secondary | ICD-10-CM | POA: Insufficient documentation

## 2023-11-04 LAB — ECHOCARDIOGRAM COMPLETE
AR max vel: 1.57 cm2
AV Area VTI: 1.55 cm2
AV Area mean vel: 1.5 cm2
AV Mean grad: 4 mmHg
AV Peak grad: 8 mmHg
Ao pk vel: 1.41 m/s
Area-P 1/2: 1.99 cm2
S' Lateral: 3.29 cm

## 2023-11-06 ENCOUNTER — Encounter (HOSPITAL_BASED_OUTPATIENT_CLINIC_OR_DEPARTMENT_OTHER): Payer: Self-pay

## 2023-11-09 ENCOUNTER — Other Ambulatory Visit: Payer: Self-pay | Admitting: Internal Medicine

## 2023-11-15 ENCOUNTER — Other Ambulatory Visit: Payer: Self-pay | Admitting: Internal Medicine

## 2023-12-18 ENCOUNTER — Ambulatory Visit (INDEPENDENT_AMBULATORY_CARE_PROVIDER_SITE_OTHER): Admitting: Podiatry

## 2023-12-18 ENCOUNTER — Encounter: Payer: Self-pay | Admitting: Podiatry

## 2023-12-18 DIAGNOSIS — B351 Tinea unguium: Secondary | ICD-10-CM

## 2023-12-18 DIAGNOSIS — M79674 Pain in right toe(s): Secondary | ICD-10-CM | POA: Diagnosis not present

## 2023-12-18 DIAGNOSIS — E1151 Type 2 diabetes mellitus with diabetic peripheral angiopathy without gangrene: Secondary | ICD-10-CM

## 2023-12-18 DIAGNOSIS — M79675 Pain in left toe(s): Secondary | ICD-10-CM

## 2023-12-18 NOTE — Progress Notes (Signed)
This patient returns to my office for at risk foot care.  This patient requires this care by a professional since this patient will be at risk due to having diabetes and CKD.  This patient is unable to cut nails herself since the patient cannot reach her nails.These nails are painful walking and wearing shoes.  This patient presents for at risk foot care today.  General Appearance  Alert, conversant and in no acute stress.  Vascular  Dorsalis pedis and posterior tibial  pulses are  weakly palpable  bilaterally.  Capillary return is within normal limits  bilaterally. Temperature is within normal limits  bilaterally.  Neurologic  Senn-Weinstein monofilament wire test within normal limits  bilaterally. Muscle power within normal limits bilaterally.  Nails Thick disfigured discolored nails with subungual debris  from hallux to fifth toes bilaterally. No evidence of bacterial infection or drainage bilaterally.  Orthopedic  No limitations of motion  feet .  No crepitus or effusions noted.  No bony pathology or digital deformities noted.  Skin  normotropic skin with no porokeratosis noted bilaterally.  No signs of infections or ulcers noted.     Onychomycosis  Pain in right toes  Pain in left toes  Consent was obtained for treatment procedures.   Mechanical debridement of nails 1-5  bilaterally performed with a nail nipper.  Filed with dremel without incident.    Return office visit     3 months                 Told patient to return for periodic foot care and evaluation due to potential at risk complications.   Gardiner Barefoot DPM

## 2023-12-22 ENCOUNTER — Telehealth: Payer: Self-pay | Admitting: Internal Medicine

## 2023-12-22 ENCOUNTER — Other Ambulatory Visit: Payer: Self-pay

## 2023-12-22 DIAGNOSIS — I10 Essential (primary) hypertension: Secondary | ICD-10-CM

## 2023-12-22 DIAGNOSIS — N1831 Chronic kidney disease, stage 3a: Secondary | ICD-10-CM

## 2023-12-22 NOTE — Telephone Encounter (Signed)
 Labs ok.

## 2023-12-22 NOTE — Telephone Encounter (Unsigned)
 Copied from CRM [id number]. Topic: Clinical - Request for Lab/Test Order >> Dec 22, 2023 11:49 AM [name] wrote: Reason for CRM: Patient would like to know if she could have labs do before she comes in for her OV on 6/19 with Dr Donnette Gal.Could orders be placed and patient contacted to schedule?

## 2023-12-24 NOTE — Telephone Encounter (Signed)
 Spoke with patient today.

## 2023-12-25 ENCOUNTER — Other Ambulatory Visit

## 2023-12-25 DIAGNOSIS — I10 Essential (primary) hypertension: Secondary | ICD-10-CM | POA: Diagnosis not present

## 2023-12-25 DIAGNOSIS — N1831 Chronic kidney disease, stage 3a: Secondary | ICD-10-CM | POA: Diagnosis not present

## 2023-12-25 DIAGNOSIS — E1122 Type 2 diabetes mellitus with diabetic chronic kidney disease: Secondary | ICD-10-CM

## 2023-12-25 LAB — CBC WITH DIFFERENTIAL/PLATELET
Basophils Absolute: 0.1 10*3/uL (ref 0.0–0.1)
Basophils Relative: 0.9 % (ref 0.0–3.0)
Eosinophils Absolute: 0.2 10*3/uL (ref 0.0–0.7)
Eosinophils Relative: 2.5 % (ref 0.0–5.0)
HCT: 33.9 % — ABNORMAL LOW (ref 36.0–46.0)
Hemoglobin: 11.2 g/dL — ABNORMAL LOW (ref 12.0–15.0)
Lymphocytes Relative: 15.7 % (ref 12.0–46.0)
Lymphs Abs: 1.2 10*3/uL (ref 0.7–4.0)
MCHC: 33.1 g/dL (ref 30.0–36.0)
MCV: 95.5 fl (ref 78.0–100.0)
Monocytes Absolute: 0.6 10*3/uL (ref 0.1–1.0)
Monocytes Relative: 7.5 % (ref 3.0–12.0)
Neutro Abs: 5.7 10*3/uL (ref 1.4–7.7)
Neutrophils Relative %: 73.4 % (ref 43.0–77.0)
Platelets: 273 10*3/uL (ref 150.0–400.0)
RBC: 3.55 Mil/uL — ABNORMAL LOW (ref 3.87–5.11)
RDW: 11.9 % (ref 11.5–15.5)
WBC: 7.7 10*3/uL (ref 4.0–10.5)

## 2023-12-25 LAB — COMPREHENSIVE METABOLIC PANEL WITH GFR
ALT: 12 U/L (ref 0–35)
AST: 15 U/L (ref 0–37)
Albumin: 4.1 g/dL (ref 3.5–5.2)
Alkaline Phosphatase: 107 U/L (ref 39–117)
BUN: 24 mg/dL — ABNORMAL HIGH (ref 6–23)
CO2: 29 meq/L (ref 19–32)
Calcium: 9.5 mg/dL (ref 8.4–10.5)
Chloride: 103 meq/L (ref 96–112)
Creatinine, Ser: 1.17 mg/dL (ref 0.40–1.20)
GFR: 41.99 mL/min — ABNORMAL LOW (ref >60.00)
Glucose, Bld: 95 mg/dL (ref 70–99)
Potassium: 4 meq/L (ref 3.5–5.1)
Sodium: 139 meq/L (ref 135–145)
Total Bilirubin: 1.1 mg/dL (ref 0.2–1.2)
Total Protein: 7.6 g/dL (ref 6.0–8.3)

## 2023-12-25 LAB — LIPID PANEL
Cholesterol: 120 mg/dL (ref 0–200)
HDL: 29.7 mg/dL — ABNORMAL LOW (ref >39.00)
LDL Cholesterol: 79 mg/dL (ref 0–99)
NonHDL: 90.13
Total CHOL/HDL Ratio: 4
Triglycerides: 56 mg/dL (ref 0.0–149.0)
VLDL: 11.2 mg/dL (ref 0.0–40.0)

## 2023-12-25 LAB — HEMOGLOBIN A1C: Hgb A1c MFr Bld: 5.1 % (ref 4.6–6.5)

## 2023-12-29 ENCOUNTER — Ambulatory Visit: Payer: Medicare PPO | Admitting: Internal Medicine

## 2023-12-31 NOTE — Progress Notes (Signed)
 Subjective:    Patient ID: [name], female    DOB: 11-Sep-1934, 89 y.o.   MRN: [id number]     HPI Meleni is here for follow up of her chronic medical problems.  Had blood work done.   Medications and allergies reviewed with patient and updated if appropriate.  Current Outpatient Medications on File Prior to Visit  Medication Sig Dispense Refill   albuterol  (VENTOLIN  HFA) 108 (90 Base) MCG/ACT inhaler INHALE 2 PUFFS BY MOUTH EVERY 6 HOURS AS NEEDED FOR WHEEZING OR SHORTNESS OF BREATH 18 g 5   Alum Hydroxide-Mag Carbonate (GAVISCON PO) Take 1 Dose by mouth as directed.     amLODipine  (NORVASC ) 5 MG tablet TAKE 1 TABLET(5 MG) BY MOUTH DAILY 90 tablet 1   aspirin 81 MG tablet Take 81 mg by mouth daily.     Cholecalciferol (VITAMIN D3) 50 MCG (2000 UT) capsule Take 1 capsule (2,000 Units total) by mouth daily. 100 capsule 3   cyclobenzaprine  (FLEXERIL ) 5 MG tablet Take 1 tablet (5 mg total) by mouth 3 (three) times daily as needed for muscle spasms. 30 tablet 1   fluticasone  (FLONASE ) 50 MCG/ACT nasal spray SHAKE LIQUID AND USE 2 SPRAYS IN EACH NOSTRIL DAILY 48 g 1   furosemide  (LASIX ) 40 MG tablet TAKE 1 TABLET(40 MG) BY MOUTH DAILY 90 tablet 1   ketoconazole  (NIZORAL ) 2 % cream Apply 1 Application topically daily. Apply 1gm to bottom of feet daily. 60 g 2   loratadine  (CLARITIN ) 10 MG tablet Take 1 tablet (10 mg total) by mouth daily. 30 tablet 11   LORazepam  (ATIVAN ) 0.5 MG tablet TAKE 1 TABLET(0.5 MG) BY MOUTH DAILY AS NEEDED FOR ANXIETY 30 tablet 1   losartan  (COZAAR ) 100 MG tablet TAKE 1 TABLET(100 MG) BY MOUTH DAILY 90 tablet 3   rosuvastatin  (CRESTOR ) 10 MG tablet TAKE 1 TABLET(10 MG) BY MOUTH DAILY 90 tablet 3   Semaglutide  (RYBELSUS ) 3 MG TABS TAKE 1 TABLET BY MOUTH DAILY 30 MINUTES BEFORE BREAKFAST 90 tablet 2   sertraline  (ZOLOFT ) 50 MG tablet TAKE 1 TABLET(50 MG) BY MOUTH DAILY 90 tablet 1   traMADol  (ULTRAM ) 50 MG tablet Take 1-2 tablets (50-100 mg total) by mouth  every 8 (eight) hours as needed for severe pain (pain score 7-10). 40 tablet 0   No current facility-administered medications on file prior to visit.     Review of Systems     Objective:  There were no vitals filed for this visit. BP Readings from Last 3 Encounters:  09/22/23 106/60  08/27/23 136/70  08/26/23 (!) 160/89   Wt Readings from Last 3 Encounters:  09/22/23 219 lb 3.2 oz (99.4 kg)  09/18/23 224 lb (101.6 kg)  08/27/23 224 lb (101.6 kg)   There is no height or weight on file to calculate BMI.    Physical Exam     Lab Results  Component Value Date   WBC 7.7 12/25/2023   HGB 11.2 (L) 12/25/2023   HCT 33.9 (L) 12/25/2023   PLT 273.0 12/25/2023   GLUCOSE 95 12/25/2023   CHOL 120 12/25/2023   TRIG 56.0 12/25/2023   HDL 29.70 (L) 12/25/2023   LDLCALC 79 12/25/2023   ALT 12 12/25/2023   AST 15 12/25/2023   NA 139 12/25/2023   K 4.0 12/25/2023   CL 103 12/25/2023   CREATININE 1.17 12/25/2023   BUN 24 (H) 12/25/2023   CO2 29 12/25/2023   TSH 2.22 01/12/2020   HGBA1C  5.1 12/25/2023   MICROALBUR <0.7 12/20/2021     Assessment & Plan:    See Problem List for Assessment and Plan of chronic medical problems.

## 2023-12-31 NOTE — Patient Instructions (Addendum)
      Blood work was ordered for your next visit.       Medications changes include :   None      Return in about 6 months (around 07/02/2024) for Physical Exam.

## 2024-01-01 ENCOUNTER — Ambulatory Visit: Attending: Cardiology | Admitting: Cardiology

## 2024-01-01 ENCOUNTER — Ambulatory Visit (INDEPENDENT_AMBULATORY_CARE_PROVIDER_SITE_OTHER): Admitting: Internal Medicine

## 2024-01-01 ENCOUNTER — Encounter: Payer: Self-pay | Admitting: Internal Medicine

## 2024-01-01 ENCOUNTER — Encounter: Payer: Self-pay | Admitting: Cardiology

## 2024-01-01 VITALS — BP 118/60 | HR 74 | Temp 98.0°F | Ht 66.0 in | Wt 215.0 lb

## 2024-01-01 VITALS — BP 112/56 | HR 70 | Ht 66.0 in | Wt 216.0 lb

## 2024-01-01 DIAGNOSIS — M25561 Pain in right knee: Secondary | ICD-10-CM | POA: Diagnosis not present

## 2024-01-01 DIAGNOSIS — F32A Depression, unspecified: Secondary | ICD-10-CM

## 2024-01-01 DIAGNOSIS — N1831 Chronic kidney disease, stage 3a: Secondary | ICD-10-CM | POA: Diagnosis not present

## 2024-01-01 DIAGNOSIS — E1122 Type 2 diabetes mellitus with diabetic chronic kidney disease: Secondary | ICD-10-CM

## 2024-01-01 DIAGNOSIS — F419 Anxiety disorder, unspecified: Secondary | ICD-10-CM

## 2024-01-01 DIAGNOSIS — D649 Anemia, unspecified: Secondary | ICD-10-CM

## 2024-01-01 DIAGNOSIS — I10 Essential (primary) hypertension: Secondary | ICD-10-CM | POA: Diagnosis not present

## 2024-01-01 DIAGNOSIS — G8929 Other chronic pain: Secondary | ICD-10-CM

## 2024-01-01 DIAGNOSIS — E785 Hyperlipidemia, unspecified: Secondary | ICD-10-CM | POA: Diagnosis not present

## 2024-01-01 DIAGNOSIS — J4520 Mild intermittent asthma, uncomplicated: Secondary | ICD-10-CM | POA: Diagnosis not present

## 2024-01-01 DIAGNOSIS — E7849 Other hyperlipidemia: Secondary | ICD-10-CM | POA: Diagnosis not present

## 2024-01-01 DIAGNOSIS — R0609 Other forms of dyspnea: Secondary | ICD-10-CM | POA: Diagnosis not present

## 2024-01-01 DIAGNOSIS — R1310 Dysphagia, unspecified: Secondary | ICD-10-CM | POA: Insufficient documentation

## 2024-01-01 DIAGNOSIS — M25562 Pain in left knee: Secondary | ICD-10-CM

## 2024-01-01 DIAGNOSIS — I700 Atherosclerosis of aorta: Secondary | ICD-10-CM

## 2024-01-01 NOTE — Assessment & Plan Note (Signed)
 Chronic  Lab Results  Component Value Date   HGBA1C 5.1 12/25/2023   Sugars well-controlled Continue Rybelsus  3 mg daily

## 2024-01-01 NOTE — Assessment & Plan Note (Addendum)
 Chronic Controlled Has gotten worse compared to last year which could be related to her granddaughter's cat that is new compared to last year Does not have much symptoms during the day, but when she first lays down at night she feels like she has some difficulty breathing and the albuterol  inhaler helps Okay to continue for now Continue albuterol  inhaler as needed

## 2024-01-01 NOTE — Assessment & Plan Note (Signed)
Chronic Regular exercise and healthy diet encouraged Check lipid panel  Continue Crestor 10 mg daily 

## 2024-01-01 NOTE — Assessment & Plan Note (Signed)
Chronic Blood pressure well controlled CMP Continue amlodipine 5 mg daily, losartan 100 mg daily 

## 2024-01-01 NOTE — Assessment & Plan Note (Signed)
 Chronic Mild, stable Normocytic-likely anemia of chronic disease CBC

## 2024-01-01 NOTE — Patient Instructions (Signed)
 Medication Instructions:  The current medical regimen is effective;  continue present plan and medications.  *If you need a refill on your cardiac medications before your next appointment, please call your pharmacy*   Follow-Up: At [facility], you and your health needs are our priority.  As part of our continuing mission to provide you with exceptional heart care, our providers are all part of one team.  This team includes your primary Cardiologist (physician) and Advanced Practice Providers or APPs (Physician Assistants and Nurse Practitioners) who all work together to provide you with the care you need, when you need it.  Your next appointment:   Follow up as needed   We recommend signing up for the patient portal called "MyChart".  Sign up information is provided on this After Visit Summary.  MyChart is used to connect with patients for Virtual Visits (Telemedicine).  Patients are able to view lab/test results, encounter notes, upcoming appointments, etc.  Non-urgent messages can be sent to your provider as well.   To learn more about what you can do with MyChart, go to [email or web address].

## 2024-01-01 NOTE — Progress Notes (Signed)
 Cardiology Office Note:  .   Date:  01/01/2024  ID:  [name], DOB [birth year], MRN [id number] PCP: Colene Dauphin, MD  [facility] Providers Cardiologist:  Dorothye Gathers, MD    History of Present Illness: .   [name] is a 89 y.o. female Discussed the use of AI scribe software for clinical note transcription with the patient, who gave verbal consent to proceed.  History of Present Illness [name] is an 89 year old female with hypertension and CKD stage 3B who presents with breathing difficulties.  She has been experiencing ongoing breathing difficulties since February, with an episode of severe dyspnea and jaw pain leading to an emergency room visit. Her breathing issues are more pronounced with activity and at night when preparing for bed. She uses an inhaler before bed, which provides significant relief, and needs to sleep with her head elevated.  Her daughter recalls an incident where she began wheezing while walking to the car, necessitating the use of her inhaler multiple times and eventually calling paramedics. Following this, her blood pressure medication was adjusted to better control her hypertension.  Her current medications include amlodipine  5 mg, furosemide  40 mg daily, losartan  100 mg, and Crestor  10 mg. She initially took furosemide  every other day, leading to fluid buildup and elevated blood pressure.  Recent diagnostic studies include a normal echocardiogram with mild stiffness and borderline dilation of the aortic root, a normal BNP, negative CT for pulmonary embolism, negative troponin, and an unremarkable chest x-ray. Her LDL is 63 with Crestor , and her A1c is 5.3. Her creatinine is stable at 1.2.  No recent cold symptoms and no new or worsening symptoms beyond her chronic breathing difficulties.     Studies Reviewed: .        Results LABS Troponin: Negative LDL: 63 A1c: 5.3 Creatinine: 1.2  RADIOLOGY CT of the chest: Negative for PE Chest  x-ray: Unremarkable  DIAGNOSTIC Echocardiogram: Normal pump function, mild stiffness, borderline dilation aortic root (10/2023) Risk Assessment/Calculations:            Physical Exam:   VS:  BP (!) 112/56 (BP Location: Left Arm, Patient Position: Sitting)   Pulse 70   Ht 5' 6 (1.676 m)   Wt 216 lb (98 kg)   SpO2 96%   BMI 34.86 kg/m    Wt Readings from Last 3 Encounters:  01/01/24 216 lb (98 kg)  01/01/24 215 lb (97.5 kg)  09/22/23 219 lb 3.2 oz (99.4 kg)    GEN: Well nourished, well developed in no acute distress NECK: No JVD; No carotid bruits CARDIAC: RRR, no murmurs, no rubs, no gallops RESPIRATORY:  Clear to auscultation without rales, wheezing or rhonchi  ABDOMEN: Soft, non-tender, non-distended EXTREMITIES:  No edema; No deformity   ASSESSMENT AND PLAN: .    Assessment and Plan Assessment & Plan Dyspnea Dyspnea occurs with activity and at night. Echocardiogram indicates normal pump function with mild stiffness, potentially contributing to symptoms. She experiences significant relief with nighttime inhaler use, suggesting possible reactive airways or tightness. She opts against further pulmonary testing unless symptoms worsen, a decision supported by her primary care physician. - Continue nighttime inhaler use as needed - Continue furosemide  40 mg daily for fluid management - Could be in part related to diastolic dysfunction as well. Good to continue HTN control and lasix .   Hypertension Hypertension is well-controlled with current medications. Recent blood pressure readings are 112/56 and 118/60. A previous episode of elevated  blood pressure at 200/100 was managed with medication adjustments, including increased Lasix  and blood pressure medication. - Continue amlodipine  5 mg daily - Continue losartan  100 mg daily - Regular blood pressure monitoring  Chronic Kidney Disease, Stage 3B Chronic Kidney Disease Stage 3B is stable with prior creatinine at  1.2.  Hyperlipidemia Hyperlipidemia is well-managed with Crestor . Recent LDL level is 63, within target range. - Continue Crestor  10 mg daily         Will graduate from the cardiology clinic.  Stable currently.  Approved current medication regimen.  Continue.  Please let us  know if we can be of further assistance.  Signed, Dorothye Gathers, MD

## 2024-01-01 NOTE — Assessment & Plan Note (Signed)
 New [place] difficulty swallowing at times She states she does feel like there are exposures in her throat sometimes-discussed this could be cervical spine disease/arthritis Discussed swallowing evaluation-she deferred at this time Advised if swallowing persists or gets worse we need to evaluate further and she will let me know

## 2024-01-01 NOTE — Assessment & Plan Note (Signed)
 Chronic Controlled, Stable but feels anxious at night when she wakes in the middle of the night Continue sertraline  50 mg daily, lorazepam  0.5 mg daily as needed-encouraged to keep this to a minimum

## 2024-01-01 NOTE — Assessment & Plan Note (Signed)
 Chronic GFR has been fairly stable Avoid NSAIDs, increased water intake She has been taking the furosemide every other day-swelling is tolerable and she is hoping that will help her kidney function CMP

## 2024-01-01 NOTE — Assessment & Plan Note (Signed)
 Chronic Follows with orthopedics Does not want surgery-uses a cane to ambulate Has had injections in the past Was taking NSAIDs at 1 point which may have contributed to CKD-no longer taking ibuprofen Taking Tylenol , Voltaren gel Tramadol  50-100 mg Q 8 hrs as needed-discussed increased risk of memory issues

## 2024-01-22 ENCOUNTER — Other Ambulatory Visit: Payer: Self-pay | Admitting: Internal Medicine

## 2024-01-24 ENCOUNTER — Other Ambulatory Visit: Payer: Self-pay | Admitting: Internal Medicine

## 2024-02-01 ENCOUNTER — Other Ambulatory Visit: Payer: Self-pay | Admitting: Internal Medicine

## 2024-02-03 DIAGNOSIS — H5212 Myopia, left eye: Secondary | ICD-10-CM | POA: Diagnosis not present

## 2024-02-03 DIAGNOSIS — E119 Type 2 diabetes mellitus without complications: Secondary | ICD-10-CM | POA: Diagnosis not present

## 2024-02-03 DIAGNOSIS — D3132 Benign neoplasm of left choroid: Secondary | ICD-10-CM | POA: Diagnosis not present

## 2024-02-03 DIAGNOSIS — H524 Presbyopia: Secondary | ICD-10-CM | POA: Diagnosis not present

## 2024-02-03 DIAGNOSIS — H52223 Regular astigmatism, bilateral: Secondary | ICD-10-CM | POA: Diagnosis not present

## 2024-02-03 DIAGNOSIS — H35033 Hypertensive retinopathy, bilateral: Secondary | ICD-10-CM | POA: Diagnosis not present

## 2024-02-03 DIAGNOSIS — H26493 Other secondary cataract, bilateral: Secondary | ICD-10-CM | POA: Diagnosis not present

## 2024-02-03 LAB — HM DIABETES EYE EXAM

## 2024-02-04 ENCOUNTER — Other Ambulatory Visit: Payer: Self-pay | Admitting: Internal Medicine

## 2024-02-04 NOTE — Telephone Encounter (Unsigned)
 Copied from CRM [id number]. Topic: Clinical - Medication Refill >> Feb 04, 2024  4:21 PM [name] wrote: Medication: Semaglutide  (RYBELSUS ) 3 MG TABS  Has the patient contacted their pharmacy? Yes (Agent: If no, request that the patient contact the pharmacy for the refill. If patient does not wish to contact the pharmacy document the reason why and proceed with request.) (Agent: If yes, when and what did the pharmacy advise?)  This is the patient's preferred pharmacy:  [id number] [place], [place] - [address] AT [facility] [address] [facility] [address] [facility] [address] [place] [address] Phone: [phone] Fax: [phone]  Is this the correct pharmacy for this prescription? Yes If no, delete pharmacy and type the correct one.   Has the prescription been filled recently? No  Is the patient out of the medication? Yes  Has the patient been seen for an appointment in the last year OR does the patient have an upcoming appointment? Yes  Can we respond through MyChart? Yes  Agent: Please be advised that Rx refills may take up to 3 business days. We ask that you follow-up with your pharmacy.

## 2024-02-05 MED ORDER — RYBELSUS 3 MG PO TABS: ORAL_TABLET | ORAL | 2 refills | Status: AC

## 2024-02-05 NOTE — Telephone Encounter (Unsigned)
 Copied from CRM [id number]. Topic: Clinical - Medication Refill >> Feb 04, 2024  4:21 PM [name] wrote: Medication: Semaglutide  (RYBELSUS ) 3 MG TABS  Has the patient contacted their pharmacy? Yes (Agent: If no, request that the patient contact the pharmacy for the refill. If patient does not wish to contact the pharmacy document the reason why and proceed with request.) (Agent: If yes, when and what did the pharmacy advise?)  This is the patient's preferred pharmacy:  [id number] [place], [place] - [address] AT [facility] [address] [facility] [address] & [facility] [address] [place] [address] Phone: [phone] Fax: [phone]  Is this the correct pharmacy for this prescription? Yes If no, delete pharmacy and type the correct one.   Has the prescription been filled recently? No  Is the patient out of the medication? Yes  Has the patient been seen for an appointment in the last year OR does the patient have an upcoming appointment? Yes  Can we respond through MyChart? Yes  Agent: Please be advised that Rx refills may take up to 3 business days. We ask that you follow-up with your pharmacy. >> Feb 05, 2024  3:14 PM [name] wrote: Patient is calling back wanting an update on her medication refill. I let her know that Dr. Geofm tried reaching out via MyChart and the patient was a bit upset because she notified the previous person she spoke with that she cannot access MyChart. Being said, she would like to be contacted via cell phone moving forward. She did want to let Dr. Geofm know that she would like to stick with 3[facility]  and would like to know if she would be able to send that in by the latest, this Friday.

## 2024-02-13 ENCOUNTER — Encounter: Payer: Self-pay | Admitting: Family Medicine

## 2024-02-13 ENCOUNTER — Ambulatory Visit: Admitting: Internal Medicine

## 2024-02-13 ENCOUNTER — Ambulatory Visit

## 2024-02-13 ENCOUNTER — Ambulatory Visit: Admitting: Family Medicine

## 2024-02-13 VITALS — BP 142/74 | HR 59 | Temp 97.7°F | Ht 66.0 in | Wt 212.6 lb

## 2024-02-13 DIAGNOSIS — Z96643 Presence of artificial hip joint, bilateral: Secondary | ICD-10-CM | POA: Diagnosis not present

## 2024-02-13 DIAGNOSIS — M79605 Pain in left leg: Secondary | ICD-10-CM | POA: Insufficient documentation

## 2024-02-13 DIAGNOSIS — M47816 Spondylosis without myelopathy or radiculopathy, lumbar region: Secondary | ICD-10-CM | POA: Diagnosis not present

## 2024-02-13 DIAGNOSIS — M533 Sacrococcygeal disorders, not elsewhere classified: Secondary | ICD-10-CM

## 2024-02-13 DIAGNOSIS — M79651 Pain in right thigh: Secondary | ICD-10-CM

## 2024-02-13 DIAGNOSIS — G8929 Other chronic pain: Secondary | ICD-10-CM | POA: Diagnosis not present

## 2024-02-13 DIAGNOSIS — M25551 Pain in right hip: Secondary | ICD-10-CM | POA: Diagnosis not present

## 2024-02-13 DIAGNOSIS — Z96641 Presence of right artificial hip joint: Secondary | ICD-10-CM | POA: Diagnosis not present

## 2024-02-13 DIAGNOSIS — M545 Low back pain, unspecified: Secondary | ICD-10-CM

## 2024-02-13 MED ORDER — ACETAMINOPHEN-CODEINE 300-30 MG PO TABS: 1.0000 | ORAL_TABLET | Freq: Four times a day (QID) | ORAL | 0 refills | Status: DC | PRN

## 2024-02-13 NOTE — Progress Notes (Signed)
 Acute Office Visit  Subjective:     Patient ID: [name], female    DOB: 1934/09/11, 89 y.o.   MRN: [id number]  Chief Complaint  Patient presents with   Leg Pain    Leg Pain     Discussed the use of AI scribe software for clinical note transcription with the patient, who gave verbal consent to proceed.  History of Present Illness [name] is an 89 year old female who presents with severe thigh and hip pain.  Thigh and hip pain - Severe, deep aching pain localized to the thigh and hip region - Pain significantly impacts ability to stand for prolonged periods - Requires frequent breaks to perform daily activities - No numbness or tingling in the thigh area  Medication response and adverse effects - Tramadol  combined with Tylenol  provided only temporary relief - Tramadol  caused nausea, vomiting, and severe constipation, leading to discontinuation - Advil was ineffective for pain relief - Muscle relaxer did not alleviate symptoms  Neurological symptoms - Occasional numbness in the fingers     ROS Per HPI      Objective:    BP (!) 142/74 (BP Location: Left Arm, Patient Position: Sitting)   Pulse (!) 59   Temp 97.7 F (36.5 C) (Temporal)   Ht 5' 6 (1.676 m)   Wt 212 lb 9.6 oz (96.4 kg)   SpO2 97%   BMI 34.31 kg/m    Physical Exam Vitals and nursing note reviewed.  Constitutional:      General: She is not in acute distress.    Appearance: She is obese.     Comments: Elderly   HENT:     Head: Normocephalic and atraumatic.     Right Ear: External ear normal.     Left Ear: External ear normal.     Nose: Nose normal.     Mouth/Throat:     Mouth: Mucous membranes are moist.     Pharynx: Oropharynx is clear.  Eyes:     Extraocular Movements: Extraocular movements intact.     Pupils: Pupils are equal, round, and reactive to light.  Cardiovascular:     Rate and Rhythm: Normal rate and regular rhythm.     Pulses: Normal pulses.     Heart sounds:  Normal heart sounds.  Pulmonary:     Effort: Pulmonary effort is normal. No respiratory distress.     Breath sounds: Normal breath sounds. No wheezing, rhonchi or rales.  Musculoskeletal:     Cervical back: Normal range of motion.     Right lower leg: No edema.     Left lower leg: No edema.     Comments: LROM, Limping to favor right leg, using single point cane, cannot tolerate exam through range of motion  Lymphadenopathy:     Cervical: No cervical adenopathy.  Neurological:     General: No focal deficit present.     Mental Status: She is alert and oriented to person, place, and time.  Psychiatric:        Mood and Affect: Mood normal.        Thought Content: Thought content normal.     No results found for any visits on 02/13/24.      Assessment & Plan:   Assessment and Plan Assessment & Plan Right thigh and hip pain Chronic severe right thigh and hip pain, likely musculoskeletal or orthopedic, possibly involving femur or hip joint. Pain is bone-related without neurological symptoms. Previous medications provided temporary relief  but caused side effects. Pain limits standing and daily activities. - Order x-ray of low back, hip, and femur to assess bone or joint issues. - Refer to orthopedics for further evaluation and management. - Prescribe Tylenol  with codeine  for pain, advise taking with food to reduce gastrointestinal side effects.     Orders Placed This Encounter  Procedures   DG Lumbar Spine 2-3 Views    Standing Status:   Future    Number of Occurrences:   1    Expiration Date:   08/15/2024    Reason for Exam (SYMPTOM  OR DIAGNOSIS REQUIRED):   low back pain    Preferred imaging location?:   [facility]   DG FEMUR, MIN 2 VIEWS RIGHT    Standing Status:   Future    Number of Occurrences:   1    Expiration Date:   02/12/2025    Reason for Exam (SYMPTOM  OR DIAGNOSIS REQUIRED):   right thigh pain    Preferred imaging location?:   [facility]   DG  Pelvis 1-2 Views    Standing Status:   Future    Number of Occurrences:   1    Expiration Date:   08/15/2024    Reason for Exam (SYMPTOM  OR DIAGNOSIS REQUIRED):   hip pain    Preferred imaging location?:   [facility]   Ambulatory referral to Orthopedics    Referral Priority:   Emergency    Referral Type:   Consultation    Number of Visits Requested:   1     Meds ordered this encounter  Medications   acetaminophen -codeine  (TYLENOL  #3) 300-30 MG tablet    Sig: Take 1 tablet by mouth every 6 (six) hours as needed for moderate pain (pain score 4-6) or severe pain (pain score 7-10).    Dispense:  30 tablet    Refill:  0    Return if symptoms worsen or fail to improve.  Corean LITTIE Ku, FNP

## 2024-02-13 NOTE — Patient Instructions (Signed)
 STOP tramadol   May START tylenol  #3 one tablet every 6 hours as needed for mod-severe pain. This medication may make you sleepy.  We are getting an xray today. We will be in contact with any abnormal results that require further attention.  I have sent a referral to Orthopedics and they will be reaching out to get you scheduled.

## 2024-02-17 ENCOUNTER — Ambulatory Visit: Payer: Self-pay | Admitting: Family Medicine

## 2024-02-20 ENCOUNTER — Encounter (HOSPITAL_BASED_OUTPATIENT_CLINIC_OR_DEPARTMENT_OTHER): Payer: Self-pay | Admitting: Physician Assistant

## 2024-02-20 ENCOUNTER — Ambulatory Visit (INDEPENDENT_AMBULATORY_CARE_PROVIDER_SITE_OTHER): Admitting: Physician Assistant

## 2024-02-20 DIAGNOSIS — M545 Low back pain, unspecified: Secondary | ICD-10-CM

## 2024-02-20 DIAGNOSIS — G8929 Other chronic pain: Secondary | ICD-10-CM

## 2024-02-20 NOTE — Progress Notes (Signed)
 Office Visit Note   Patient: [name]           Date of Birth: Dec 03, 1934           MRN: [id number] Visit Date: 02/20/2024              Requested by: Alvia Corean CROME, FNP [address] [place],  [place] [address] PCP: Geofm Glade PARAS, MD   Assessment & Plan: Visit Diagnoses:  1. Chronic bilateral low back pain without sciatica     Plan: Patient is a pleasant 89 year old woman who is accompanied by her son.  She comes in with a chief complaint of low back pain radiating to into her thigh bone.  She said this has been going on for about 3 weeks no particular injury.  She has been seen by her primary care who did x-rays of her femur of her hips and of her lower back.  She is willing to do this.  If she does not get better would recommend an MRI and referral to [facility] to discuss possible facet or ESI injections  Follow-Up Instructions: Return if symptoms worsen or fail to improve.   Orders:  Orders Placed This Encounter  Procedures   Ambulatory referral to Physical Therapy   No orders of the defined types were placed in this encounter.     Procedures: No procedures performed   Clinical Data: No additional findings.   Subjective: Chief Complaint  Patient presents with   Lower Back - Pain    HPI pleasant [age]-year-old woman comes in today with a chief complaint of low back pain radiating into her right buttock and right thigh.  She has had no injury or falls.  No changes in bowel or bladder control.  She has had thorough x-rays with her primary care who did not find any significant findings in her hips or her thigh but did find advanced degenerative changes in her spine  Review of Systems  All other systems reviewed and are negative.    Objective: Vital Signs: There were no vitals taken for this visit.  Physical Exam Constitutional:      Appearance: Normal appearance.  Pulmonary:     Effort: Pulmonary effort is normal.  Neurological:      General: No focal deficit present.     Mental Status: She is alert and oriented to person, place, and time.  Psychiatric:        Mood and Affect: Mood normal.        Behavior: Behavior normal.     Ortho Exam Examination of her lumbar spine she has some pain to palpation but no redness or step-offs.  She has pain with flexion not so much with extension her strength is 5 out of 5 with dorsiflexion plantarflexion of her ankles extension and flexion of her legs.  Negative straight leg raise bilaterally sensation is grossly intact Specialty Comments:  No specialty comments available.  Imaging: No results found.   PMFS History: Patient Active Problem List   Diagnosis Date Noted   Left leg pain 02/13/2024   Right thigh pain 02/13/2024   Chronic right SI joint pain 02/13/2024   Dysphagia 01/01/2024   Sicca syndrome (HCC) 07/02/2023   Decreased hearing 01/03/2023   Rash and nonspecific skin eruption 11/27/2022   Right flank pain 07/19/2022   Cough 06/13/2022   Pain and swelling of right lower leg 02/19/2022   Posterior knee pain, right 02/19/2022   COVID 08/03/2021  Type 2 diabetes mellitus with stage 3a chronic kidney disease, without long-term current use of insulin (HCC) 06/28/2021   Morbid obesity (HCC) 06/28/2021   Aortic atherosclerosis (HCC) 09/29/2020   Head pain 09/29/2020   Left knee pain 08/30/2020   CKD (chronic kidney disease) stage 3, GFR 30-59 ml/min (HCC) 08/30/2020   Lightheadedness 09/22/2019   Chronic fatigue 08/07/2016   DOE (dyspnea on exertion) 08/07/2016   Chronic knee pain, bilateral 12/29/2015   Anxiety and depression 10/13/2015   NONSPECIFIC ABNORMAL ELECTROCARDIOGRAM 08/25/2009   Low back pain 11/20/2007   Hyperlipidemia 06/23/2007   Essential hypertension 06/23/2007   Anemia 05/27/2007   ALLERGIC RHINITIS 05/27/2007   Asthma 05/27/2007   CAROTID BRUIT, LEFT 05/27/2007   METABOLIC SYNDROME X 03/03/2007   Past Medical History:  Diagnosis  Date   Allergy    Anemia    Asthma    adult onset   Carotid bruit    left   Hyperlipidemia    LDL goal = < 120 based on NMR Lipoprofile 2005   Hypertension    Metabolic syndrome     Family History  Problem Relation Age of Onset   Hypertension Mother    Arthritis Mother    Hypertension Father    Stroke Father        > 82   Heart disease Sister    COPD Sister    Hypertension Sister    Stomach cancer Maternal Grandmother    Heart attack Maternal Grandfather        age unknown; ? > 45   Diabetes Paternal Grandmother     Past Surgical History:  Procedure Laterality Date   CARDIAC CATHETERIZATION  2006   negative   CATARACT EXTRACTION, BILATERAL     Dr Camillo   COLONOSCOPY  06/02/2006   Dr.Kaplan,Next Due date 06/02/2016   SHOULDER SURGERY  11/21/2008   Dr.Aplington   TOTAL ABDOMINAL HYSTERECTOMY W/ BILATERAL SALPINGOOPHORECTOMY  1983   Dr.Fore, for fibroids with pain   TOTAL HIP ARTHROPLASTY  2005, 2006   Social History   Occupational History   Not on file  Tobacco Use   Smoking status: Never   Smokeless tobacco: Never  Substance and Sexual Activity   Alcohol use: No   Drug use: No   Sexual activity: Not on file

## 2024-03-16 ENCOUNTER — Other Ambulatory Visit: Payer: Self-pay | Admitting: Internal Medicine

## 2024-03-16 ENCOUNTER — Other Ambulatory Visit: Payer: Self-pay | Admitting: Family Medicine

## 2024-03-16 DIAGNOSIS — M79651 Pain in right thigh: Secondary | ICD-10-CM

## 2024-03-16 DIAGNOSIS — M545 Low back pain, unspecified: Secondary | ICD-10-CM

## 2024-03-16 DIAGNOSIS — G8929 Other chronic pain: Secondary | ICD-10-CM

## 2024-03-17 NOTE — Telephone Encounter (Signed)
 This is a medication she feels that she needs to stay on and she needs to follow-up with me to discuss and will need to sign a pain contract, etc.  Refill sent to pharmacy

## 2024-03-18 ENCOUNTER — Encounter: Payer: Self-pay | Admitting: Podiatry

## 2024-03-18 ENCOUNTER — Ambulatory Visit: Admitting: Podiatry

## 2024-03-18 DIAGNOSIS — M79675 Pain in left toe(s): Secondary | ICD-10-CM | POA: Diagnosis not present

## 2024-03-18 DIAGNOSIS — E1151 Type 2 diabetes mellitus with diabetic peripheral angiopathy without gangrene: Secondary | ICD-10-CM | POA: Diagnosis not present

## 2024-03-18 DIAGNOSIS — M79674 Pain in right toe(s): Secondary | ICD-10-CM

## 2024-03-18 DIAGNOSIS — B351 Tinea unguium: Secondary | ICD-10-CM | POA: Diagnosis not present

## 2024-03-18 NOTE — Progress Notes (Signed)
This patient returns to my office for at risk foot care.  This patient requires this care by a professional since this patient will be at risk due to having diabetes and CKD.  This patient is unable to cut nails herself since the patient cannot reach her nails.These nails are painful walking and wearing shoes.  This patient presents for at risk foot care today.  General Appearance  Alert, conversant and in no acute stress.  Vascular  Dorsalis pedis and posterior tibial  pulses are  weakly palpable  bilaterally.  Capillary return is within normal limits  bilaterally. Temperature is within normal limits  bilaterally.  Neurologic  Senn-Weinstein monofilament wire test within normal limits  bilaterally. Muscle power within normal limits bilaterally.  Nails Thick disfigured discolored nails with subungual debris  from hallux to fifth toes bilaterally. No evidence of bacterial infection or drainage bilaterally.  Orthopedic  No limitations of motion  feet .  No crepitus or effusions noted.  No bony pathology or digital deformities noted.  Skin  normotropic skin with no porokeratosis noted bilaterally.  No signs of infections or ulcers noted.     Onychomycosis  Pain in right toes  Pain in left toes  Consent was obtained for treatment procedures.   Mechanical debridement of nails 1-5  bilaterally performed with a nail nipper.  Filed with dremel without incident.    Return office visit     3 months                 Told patient to return for periodic foot care and evaluation due to potential at risk complications.   Gardiner Barefoot DPM

## 2024-03-30 ENCOUNTER — Ambulatory Visit: Payer: Medicare PPO

## 2024-03-30 ENCOUNTER — Other Ambulatory Visit: Payer: Self-pay | Admitting: Internal Medicine

## 2024-03-30 DIAGNOSIS — Z1231 Encounter for screening mammogram for malignant neoplasm of breast: Secondary | ICD-10-CM

## 2024-04-08 ENCOUNTER — Other Ambulatory Visit: Payer: Self-pay | Admitting: Internal Medicine

## 2024-04-26 ENCOUNTER — Other Ambulatory Visit: Payer: Self-pay | Admitting: Internal Medicine

## 2024-04-29 ENCOUNTER — Other Ambulatory Visit: Payer: Self-pay | Admitting: Internal Medicine

## 2024-05-05 ENCOUNTER — Ambulatory Visit

## 2024-05-07 ENCOUNTER — Ambulatory Visit
Admission: RE | Admit: 2024-05-07 | Discharge: 2024-05-07 | Disposition: A | Discharge status: Home / Self Care | Source: Ambulatory Visit | Attending: Internal Medicine | Admitting: Internal Medicine

## 2024-05-07 DIAGNOSIS — Z1231 Encounter for screening mammogram for malignant neoplasm of breast: Secondary | ICD-10-CM

## 2024-05-17 ENCOUNTER — Other Ambulatory Visit: Payer: Self-pay | Admitting: Internal Medicine

## 2024-05-27 ENCOUNTER — Ambulatory Visit: Admitting: Podiatry

## 2024-06-07 ENCOUNTER — Ambulatory Visit: Admitting: Podiatry

## 2024-06-07 ENCOUNTER — Encounter: Payer: Self-pay | Admitting: Podiatry

## 2024-06-07 DIAGNOSIS — M79674 Pain in right toe(s): Secondary | ICD-10-CM | POA: Diagnosis not present

## 2024-06-07 DIAGNOSIS — E1151 Type 2 diabetes mellitus with diabetic peripheral angiopathy without gangrene: Secondary | ICD-10-CM

## 2024-06-07 DIAGNOSIS — M79675 Pain in left toe(s): Secondary | ICD-10-CM | POA: Diagnosis not present

## 2024-06-07 DIAGNOSIS — B351 Tinea unguium: Secondary | ICD-10-CM | POA: Diagnosis not present

## 2024-06-07 NOTE — Progress Notes (Signed)
This patient returns to my office for at risk foot care.  This patient requires this care by a professional since this patient will be at risk due to having diabetes and CKD.  This patient is unable to cut nails herself since the patient cannot reach her nails.These nails are painful walking and wearing shoes.  This patient presents for at risk foot care today.  General Appearance  Alert, conversant and in no acute stress.  Vascular  Dorsalis pedis and posterior tibial  pulses are  weakly palpable  bilaterally.  Capillary return is within normal limits  bilaterally. Temperature is within normal limits  bilaterally.  Neurologic  Senn-Weinstein monofilament wire test within normal limits  bilaterally. Muscle power within normal limits bilaterally.  Nails Thick disfigured discolored nails with subungual debris  from hallux to fifth toes bilaterally. No evidence of bacterial infection or drainage bilaterally.  Orthopedic  No limitations of motion  feet .  No crepitus or effusions noted.  No bony pathology or digital deformities noted.  Skin  normotropic skin with no porokeratosis noted bilaterally.  No signs of infections or ulcers noted.     Onychomycosis  Pain in right toes  Pain in left toes  Consent was obtained for treatment procedures.   Mechanical debridement of nails 1-5  bilaterally performed with a nail nipper.  Filed with dremel without incident.    Return office visit     3 months                 Told patient to return for periodic foot care and evaluation due to potential at risk complications.   Gardiner Barefoot DPM

## 2024-06-17 ENCOUNTER — Telehealth: Payer: Self-pay

## 2024-06-17 ENCOUNTER — Ambulatory Visit

## 2024-06-17 VITALS — Ht 66.0 in | Wt 212.0 lb

## 2024-06-17 DIAGNOSIS — Z0000 Encounter for general adult medical examination without abnormal findings: Secondary | ICD-10-CM | POA: Diagnosis not present

## 2024-06-17 DIAGNOSIS — N1831 Chronic kidney disease, stage 3a: Secondary | ICD-10-CM

## 2024-06-17 DIAGNOSIS — I10 Essential (primary) hypertension: Secondary | ICD-10-CM

## 2024-06-17 DIAGNOSIS — E7849 Other hyperlipidemia: Secondary | ICD-10-CM

## 2024-06-17 NOTE — Patient Instructions (Signed)
 [name],  Thank you for taking the time for your Medicare Wellness Visit. I appreciate your continued commitment to your health goals. Please review the care plan we discussed, and feel free to reach out if I can assist you further.  Please note that Annual Wellness Visits do not include a physical exam. Some assessments may be limited, especially if the visit was conducted virtually. If needed, we may recommend an in-person follow-up with your provider.  Ongoing Care Seeing your primary care provider every 3 to 6 months helps us  monitor your health and provide consistent, personalized care.   Referrals If a referral was made during today's visit and you haven't received any updates within two weeks, please contact the referred provider directly to check on the status.  Recommended Screenings:  Health Maintenance  Topic Date Due   Zoster (Shingles) Vaccine (1 of 2) 07/29/1986   DTaP/Tdap/Td vaccine (3 - Td or Tdap) 07/01/2024*   Hemoglobin A1C  06/25/2024   COVID-19 Vaccine (5 - 2025-26 season) 10/27/2024   Eye exam for diabetics  02/02/2025   Complete foot exam   06/07/2025   Medicare Annual Wellness Visit  06/17/2025   Osteoporosis screening with Bone Density Scan  07/27/2028   Pneumococcal Vaccine for age over 26  Completed   Flu Shot  Completed   Meningitis B Vaccine  Aged Out  *Topic was postponed. The date shown is not the original due date.       06/17/2024    2:43 PM  Advanced Directives  Does Patient Have a Medical Advance Directive? No  Would patient like information on creating a medical advance directive? Yes (MAU/Ambulatory/Procedural Areas - Information given)   Information on Advanced Care Planning can be found at [place]  Secretary of [facility] Advance Health Care Directives Advance Health Care Directives ([email or web address]    Vision: Annual vision screenings are recommended for early detection of glaucoma, cataracts, and diabetic retinopathy. These exams can also  reveal signs of chronic conditions such as diabetes and high blood pressure.  Dental: Annual dental screenings help detect early signs of oral cancer, gum disease, and other conditions linked to overall health, including heart disease and diabetes.  Please see the attached documents for additional preventive care recommendations.

## 2024-06-17 NOTE — Progress Notes (Signed)
 Chief Complaint  Patient presents with   Medicare Wellness     Subjective:   [name] is a 89 y.o. female who presents for a Medicare Annual Wellness Visit.  Visit info / Clinical Intake: Medicare Wellness Visit Type:: Subsequent Annual Wellness Visit Persons participating in visit and providing information:: patient Medicare Wellness Visit Mode:: Telephone If telephone:: video declined Since this visit was completed virtually, some vitals may be partially provided or unavailable. Missing vitals are due to the limitations of the virtual format.: Documented vitals are patient reported If Telephone or Video please confirm:: I connected with patient using audio/video enable telemedicine. I verified patient identity with two identifiers, discussed telehealth limitations, and patient agreed to proceed. Patient Location:: home Provider Location:: home office Interpreter Needed?: No Pre-visit prep was completed: yes AWV questionnaire completed by patient prior to visit?: no Living arrangements:: with family/others Patient's Overall Health Status Rating: good Typical amount of pain: some Does pain affect daily life?: (!) yes Are you currently prescribed opioids?: no  Dietary Habits and Nutritional Risks How many meals a day?: 2 Eats fruit and vegetables daily?: yes Most meals are obtained by: preparing own meals In the last 2 weeks, have you had any of the following?: none Diabetic:: (!) yes Any non-healing wounds?: no Would you like to be referred to a Nutritionist or for Diabetic Management? : no  Functional Status Activities of Daily Living (to include ambulation/medication): Independent Ambulation: Independent with device- listed below Home Assistive Devices/Equipment: Cane Medication Administration: Independent Home Management (perform basic housework or laundry): Independent Manage your own finances?: yes Primary transportation is: driving Concerns about vision?: no  *vision screening is required for WTM* Concerns about hearing?: no  Fall Screening Falls in the past year?: 0 Number of falls in past year: 0 Was there an injury with Fall?: 0 Fall Risk Category Calculator: 0 Patient Fall Risk Level: Low Fall Risk  Fall Risk Patient at Risk for Falls Due to: Impaired mobility; Impaired balance/gait Fall risk Follow up: Education provided; Falls prevention discussed; Falls evaluation completed  Home and Transportation Safety: All rugs have non-skid backing?: yes All stairs or steps have railings?: yes Grab bars in the bathtub or shower?: yes Have non-skid surface in bathtub or shower?: yes Good home lighting?: yes Regular seat belt use?: yes Hospital stays in the last year:: no  Cognitive Assessment Difficulty concentrating, remembering, or making decisions? : no Will 6CIT or Mini Cog be Completed: no 6CIT or Mini Cog Declined: patient alert, oriented, able to answer questions appropriately and recall recent events  Advance Directives (For Healthcare) Does Patient Have a Medical Advance Directive?: No Would patient like information on creating a medical advance directive?: Yes (MAU/Ambulatory/Procedural Areas - Information given)  Reviewed/Updated  Reviewed/Updated: Reviewed All (Medical, Surgical, Family, Medications, Allergies, Care Teams, Patient Goals)    Allergies (verified) Latex   Current Medications (verified) Outpatient Encounter Medications as of 06/17/2024  Medication Sig   acetaminophen -codeine  (TYLENOL  #3) 300-30 MG tablet Take 1 tablet by mouth every 6 (six) hours as needed for moderate pain (pain score 4-6) or severe pain (pain score 7-10).   albuterol  (VENTOLIN  HFA) 108 (90 Base) MCG/ACT inhaler INHALE 2 PUFFS BY MOUTH EVERY 6 HOURS AS NEEDED FOR WHEEZING OR SHORTNESS OF BREATH   Alum Hydroxide-Mag Carbonate (GAVISCON PO) Take 1 Dose by mouth as directed.   amLODipine  (NORVASC ) 5 MG tablet TAKE 1 TABLET(5 MG) BY MOUTH DAILY    aspirin 81 MG tablet Take 81 mg by  mouth daily.   Cholecalciferol (VITAMIN D3) 50 MCG (2000 UT) capsule Take 1 capsule (2,000 Units total) by mouth daily.   cyclobenzaprine  (FLEXERIL ) 5 MG tablet Take 1 tablet (5 mg total) by mouth 3 (three) times daily as needed for muscle spasms.   fluticasone  (FLONASE ) 50 MCG/ACT nasal spray SHAKE LIQUID AND USE 2 SPRAYS IN EACH NOSTRIL DAILY   furosemide  (LASIX ) 40 MG tablet TAKE 1 TABLET(40 MG) BY MOUTH DAILY   ketoconazole  (NIZORAL ) 2 % cream Apply 1 Application topically daily. Apply 1gm to bottom of feet daily.   loratadine  (CLARITIN ) 10 MG tablet Take 1 tablet (10 mg total) by mouth daily.   LORazepam  (ATIVAN ) 0.5 MG tablet TAKE 1 TABLET(0.5 MG) BY MOUTH DAILY AS NEEDED FOR ANXIETY   losartan  (COZAAR ) 100 MG tablet TAKE 1 TABLET(100 MG) BY MOUTH DAILY   rosuvastatin  (CRESTOR ) 10 MG tablet TAKE 1 TABLET(10 MG) BY MOUTH DAILY   Semaglutide  (RYBELSUS ) 3 MG TABS TAKE 1 TABLET BY MOUTH DAILY 30 MINUTES BEFORE BREAKFAST   sertraline  (ZOLOFT ) 50 MG tablet TAKE 1 TABLET(50 MG) BY MOUTH DAILY   No facility-administered encounter medications on file as of 06/17/2024.    History: Past Medical History:  Diagnosis Date   Allergy    Anemia    Asthma    adult onset   Carotid bruit    left   Hyperlipidemia    LDL goal = < 120 based on NMR Lipoprofile 2005   Hypertension    Metabolic syndrome    Past Surgical History:  Procedure Laterality Date   CARDIAC CATHETERIZATION  2006   negative   CATARACT EXTRACTION, BILATERAL     Dr Camillo   COLONOSCOPY  06/02/2006   Dr.Kaplan,Next Due date 06/02/2016   SHOULDER SURGERY  11/21/2008   Dr.Aplington   TOTAL ABDOMINAL HYSTERECTOMY W/ BILATERAL SALPINGOOPHORECTOMY  1983   Dr.Fore, for fibroids with pain   TOTAL HIP ARTHROPLASTY  2005, 2006   Family History  Problem Relation Age of Onset   Hypertension Mother    Arthritis Mother    Hypertension Father    Stroke Father        > 83   Heart disease Sister     COPD Sister    Hypertension Sister    Stomach cancer Maternal Grandmother    Heart attack Maternal Grandfather        age unknown; ? > 53   Diabetes Paternal Grandmother    Breast cancer Neg Hx    Social History   Occupational History   Not on file  Tobacco Use   Smoking status: Never   Smokeless tobacco: Never  Substance and Sexual Activity   Alcohol use: No   Drug use: No   Sexual activity: Not on file   Tobacco Counseling Counseling given: Not Answered  SDOH Screenings   Food Insecurity: No Food Insecurity (06/17/2024)  Housing: Low Risk  (06/17/2024)  Transportation Needs: No Transportation Needs (06/17/2024)  Utilities: Not At Risk (06/17/2024)  Alcohol Screen: Low Risk  (03/25/2022)  Depression (PHQ2-9): Low Risk  (06/17/2024)  Financial Resource Strain: Low Risk  (03/27/2023)  Physical Activity: Inactive (06/17/2024)  Social Connections: Socially Integrated (06/17/2024)  Stress: No Stress Concern Present (06/17/2024)  Tobacco Use: Low Risk  (06/17/2024)  Health Literacy: Adequate Health Literacy (06/17/2024)   See flowsheets for full screening details  Depression Screen PHQ 2 & 9 Depression Scale- Over the past 2 weeks, how often have you been bothered by any of the following problems?  Little interest or pleasure in doing things: 0 Feeling down, depressed, or hopeless (PHQ Adolescent also includes...irritable): 0 PHQ-2 Total Score: 0     Goals Addressed             This Visit's Progress    My goal is to wake up every morning, continue to eat healthy and stay independent.   On track            Objective:    Today's Vitals   06/17/24 1432  Weight: 212 lb (96.2 kg)  Height: 5' 6 (1.676 m)   Body mass index is 34.22 kg/m.  Hearing/Vision screen Hearing Screening - Comments:: Patient is able to hear conversational tones without difficulty. No issues reported.   Vision Screening - Comments:: Wears rx glasses - up to date with routine eye exams with  [facility]  Immunizations and Health Maintenance Health Maintenance  Topic Date Due   Zoster Vaccines- Shingrix (1 of 2) 07/29/1986   DTaP/Tdap/Td (3 - Td or Tdap) 07/01/2024 (Originally 11/25/2020)   HEMOGLOBIN A1C  06/25/2024   COVID-19 Vaccine (5 - 2025-26 season) 10/27/2024   OPHTHALMOLOGY EXAM  02/02/2025   FOOT EXAM  06/07/2025   Medicare Annual Wellness (AWV)  06/17/2025   Bone Density Scan  07/27/2028   Pneumococcal Vaccine: 50+ Years  Completed   Influenza Vaccine  Completed   Meningococcal B Vaccine  Aged Out        Assessment/Plan:  This is a routine wellness examination for [name].  Patient Care Team: Geofm Glade PARAS, MD as PCP - General (Internal Medicine) Jeffrie Oneil BROCKS, MD as PCP - Cardiology (Cardiology) Jeffrie Oneil BROCKS, MD as Consulting Physician (Cardiology) Sharl Selinda Dover, MD as Consulting Physician (Orthopedic Surgery) Camillo Golas, MD as Attending Physician (Ophthalmology) Loreda Hacker, DPM as Consulting Physician (Podiatry)  I have personally reviewed and noted the following in the patient's chart:   Medical and social history Use of alcohol, tobacco or illicit drugs  Current medications and supplements including opioid prescriptions. Functional ability and status Nutritional status Physical activity Advanced directives List of other physicians Hospitalizations, surgeries, and ER visits in previous 12 months Vitals Screenings to include cognitive, depression, and falls Referrals and appointments  No orders of the defined types were placed in this encounter.  In addition, I have reviewed and discussed with patient certain preventive protocols, quality metrics, and best practice recommendations. A written personalized care plan for preventive services as well as general preventive health recommendations were provided to patient.   Lavelle Charmaine Browner, LPN   [id number]   Return in 1 year (on 06/17/2025).  After Visit Summary:  (MyChart) Due to this being a telephonic visit, the after visit summary with patients personalized plan was offered to patient via MyChart   Nurse Notes: Patient scheduled for 6 month follow up visit and is asking if labs can be ordered to be done before visit.

## 2024-06-17 NOTE — Telephone Encounter (Signed)
 Patient seen today for AWV and scheduled for 6 month follow up visit for 06/28/24.  She is asking if labs can be ordered to be done the week before her appointment.

## 2024-06-17 NOTE — Telephone Encounter (Signed)
 ordered

## 2024-06-27 ENCOUNTER — Encounter: Payer: Self-pay | Admitting: Internal Medicine

## 2024-06-27 NOTE — Patient Instructions (Addendum)
° ° ° ° °  Blood work was ordered.       Medications changes include :   None    A referral was ordered and someone will call you to schedule an appointment.     Return in about 6 months (around 12/27/2024) for Physical Exam.

## 2024-06-27 NOTE — Progress Notes (Unsigned)
 Subjective:    Patient ID: [name], female    DOB: Feb 18, 1935, 89 y.o.   MRN: [id number]     HPI Unita is here for follow up of her chronic medical problems.  Tailbone hurts from arthritis.  Has right leg pain > left leg pain.  Knees seldom hurt.  The upper leg - soft tissue hurts and the tibia bone hurts.    Last night had a headache on top of her head.  That occurs on an occasion.  She took a pain pill and that resolved it.  Overall she feels she is doing well.  Medications and allergies reviewed with patient and updated if appropriate.  Medications Ordered Prior to Encounter[1]   Review of Systems  Constitutional:  Negative for fever.  Respiratory:  Positive for shortness of breath (chronic- with exertion). Negative for cough and wheezing.   Cardiovascular:  Negative for chest pain, palpitations and leg swelling.  Gastrointestinal:  Positive for abdominal pain (sometimes shen she has to have a BM).  Musculoskeletal:  Negative for neck pain.  Neurological:  Negative for light-headedness and headaches.       Objective:   Vitals:   06/28/24 1408  BP: 136/60  Pulse: 63  Temp: 98.2 F (36.8 C)  SpO2: 93%   BP Readings from Last 3 Encounters:  06/28/24 136/60  02/13/24 (!) 142/74  01/01/24 (!) 112/56   Wt Readings from Last 3 Encounters:  06/28/24 220 lb (99.8 kg)  06/17/24 212 lb (96.2 kg)  02/13/24 212 lb 9.6 oz (96.4 kg)   Body mass index is 35.51 kg/m.    Physical Exam Constitutional:      General: She is not in acute distress.    Appearance: Normal appearance.  HENT:     Head: Normocephalic and atraumatic.  Eyes:     Conjunctiva/sclera: Conjunctivae normal.  Cardiovascular:     Rate and Rhythm: Normal rate and regular rhythm.     Heart sounds: Normal heart sounds.  Pulmonary:     Effort: Pulmonary effort is normal. No respiratory distress.     Breath sounds: Normal breath sounds. No wheezing.  Musculoskeletal:     Cervical back:  Neck supple.     Right lower leg: No edema.     Left lower leg: No edema.  Lymphadenopathy:     Cervical: No cervical adenopathy.  Skin:    General: Skin is warm and dry.     Findings: No rash.  Neurological:     Mental Status: She is alert. Mental status is at baseline.  Psychiatric:        Mood and Affect: Mood normal.        Behavior: Behavior normal.        Lab Results  Component Value Date   WBC 7.7 12/25/2023   HGB 11.2 (L) 12/25/2023   HCT 33.9 (L) 12/25/2023   PLT 273.0 12/25/2023   GLUCOSE 95 12/25/2023   CHOL 120 12/25/2023   TRIG 56.0 12/25/2023   HDL 29.70 (L) 12/25/2023   LDLCALC 79 12/25/2023   ALT 12 12/25/2023   AST 15 12/25/2023   NA 139 12/25/2023   K 4.0 12/25/2023   CL 103 12/25/2023   CREATININE 1.17 12/25/2023   BUN 24 (H) 12/25/2023   CO2 29 12/25/2023   TSH 2.22 01/12/2020   HGBA1C 5.1 12/25/2023     Assessment & Plan:    See Problem List for Assessment and Plan of chronic medical problems.       [  1]  Current Outpatient Medications on File Prior to Visit  Medication Sig Dispense Refill   acetaminophen -codeine  (TYLENOL  #3) 300-30 MG tablet Take 1 tablet by mouth every 6 (six) hours as needed for moderate pain (pain score 4-6) or severe pain (pain score 7-10). 30 tablet 0   albuterol  (VENTOLIN  HFA) 108 (90 Base) MCG/ACT inhaler INHALE 2 PUFFS BY MOUTH EVERY 6 HOURS AS NEEDED FOR WHEEZING OR SHORTNESS OF BREATH 18 g 5   Alum Hydroxide-Mag Carbonate (GAVISCON PO) Take 1 Dose by mouth as directed.     amLODipine  (NORVASC ) 5 MG tablet TAKE 1 TABLET(5 MG) BY MOUTH DAILY 90 tablet 1   aspirin 81 MG tablet Take 81 mg by mouth daily.     Cholecalciferol (VITAMIN D3) 50 MCG (2000 UT) capsule Take 1 capsule (2,000 Units total) by mouth daily. 100 capsule 3   cyclobenzaprine  (FLEXERIL ) 5 MG tablet Take 1 tablet (5 mg total) by mouth 3 (three) times daily as needed for muscle spasms. 30 tablet 1   fluticasone  (FLONASE ) 50 MCG/ACT nasal spray SHAKE  LIQUID AND USE 2 SPRAYS IN EACH NOSTRIL DAILY 48 g 1   furosemide  (LASIX ) 40 MG tablet TAKE 1 TABLET(40 MG) BY MOUTH DAILY 90 tablet 1   ketoconazole  (NIZORAL ) 2 % cream Apply 1 Application topically daily. Apply 1gm to bottom of feet daily. 60 g 2   loratadine  (CLARITIN ) 10 MG tablet Take 1 tablet (10 mg total) by mouth daily. 30 tablet 11   LORazepam  (ATIVAN ) 0.5 MG tablet TAKE 1 TABLET(0.5 MG) BY MOUTH DAILY AS NEEDED FOR ANXIETY 30 tablet 5   losartan  (COZAAR ) 100 MG tablet TAKE 1 TABLET(100 MG) BY MOUTH DAILY 90 tablet 3   rosuvastatin  (CRESTOR ) 10 MG tablet TAKE 1 TABLET(10 MG) BY MOUTH DAILY 90 tablet 3   Semaglutide  (RYBELSUS ) 3 MG TABS TAKE 1 TABLET BY MOUTH DAILY 30 MINUTES BEFORE BREAKFAST 90 tablet 2   sertraline  (ZOLOFT ) 50 MG tablet TAKE 1 TABLET(50 MG) BY MOUTH DAILY 90 tablet 1   No current facility-administered medications on file prior to visit.

## 2024-06-28 ENCOUNTER — Ambulatory Visit: Payer: Self-pay | Admitting: Internal Medicine

## 2024-06-28 ENCOUNTER — Ambulatory Visit: Admitting: Internal Medicine

## 2024-06-28 VITALS — BP 136/60 | HR 63 | Temp 98.2°F | Ht 66.0 in | Wt 220.0 lb

## 2024-06-28 DIAGNOSIS — E1122 Type 2 diabetes mellitus with diabetic chronic kidney disease: Secondary | ICD-10-CM

## 2024-06-28 DIAGNOSIS — N1831 Chronic kidney disease, stage 3a: Secondary | ICD-10-CM

## 2024-06-28 DIAGNOSIS — J4520 Mild intermittent asthma, uncomplicated: Secondary | ICD-10-CM

## 2024-06-28 DIAGNOSIS — E1169 Type 2 diabetes mellitus with other specified complication: Secondary | ICD-10-CM

## 2024-06-28 DIAGNOSIS — E1159 Type 2 diabetes mellitus with other circulatory complications: Secondary | ICD-10-CM | POA: Diagnosis not present

## 2024-06-28 DIAGNOSIS — D649 Anemia, unspecified: Secondary | ICD-10-CM

## 2024-06-28 DIAGNOSIS — E7849 Other hyperlipidemia: Secondary | ICD-10-CM

## 2024-06-28 DIAGNOSIS — F419 Anxiety disorder, unspecified: Secondary | ICD-10-CM

## 2024-06-28 DIAGNOSIS — F32A Depression, unspecified: Secondary | ICD-10-CM

## 2024-06-28 DIAGNOSIS — E785 Hyperlipidemia, unspecified: Secondary | ICD-10-CM

## 2024-06-28 DIAGNOSIS — I10 Essential (primary) hypertension: Secondary | ICD-10-CM

## 2024-06-28 DIAGNOSIS — N1832 Chronic kidney disease, stage 3b: Secondary | ICD-10-CM | POA: Diagnosis not present

## 2024-06-28 DIAGNOSIS — I152 Hypertension secondary to endocrine disorders: Secondary | ICD-10-CM

## 2024-06-28 DIAGNOSIS — G8929 Other chronic pain: Secondary | ICD-10-CM

## 2024-06-28 LAB — CBC WITH DIFFERENTIAL/PLATELET
Basophils Absolute: 0.1 K/uL (ref 0.0–0.1)
Basophils Relative: 1 % (ref 0.0–3.0)
Eosinophils Absolute: 0.2 K/uL (ref 0.0–0.7)
Eosinophils Relative: 3 % (ref 0.0–5.0)
HCT: 34.1 % — ABNORMAL LOW (ref 36.0–46.0)
Hemoglobin: 11.4 g/dL — ABNORMAL LOW (ref 12.0–15.0)
Lymphocytes Relative: 20.7 % (ref 12.0–46.0)
Lymphs Abs: 1.3 K/uL (ref 0.7–4.0)
MCHC: 33.4 g/dL (ref 30.0–36.0)
MCV: 95.9 fl (ref 78.0–100.0)
Monocytes Absolute: 0.4 K/uL (ref 0.1–1.0)
Monocytes Relative: 6.7 % (ref 3.0–12.0)
Neutro Abs: 4.4 K/uL (ref 1.4–7.7)
Neutrophils Relative %: 68.6 % (ref 43.0–77.0)
Platelets: 244 K/uL (ref 150.0–400.0)
RBC: 3.55 Mil/uL — ABNORMAL LOW (ref 3.87–5.11)
RDW: 11.9 % (ref 11.5–15.5)
WBC: 6.4 K/uL (ref 4.0–10.5)

## 2024-06-28 LAB — COMPREHENSIVE METABOLIC PANEL WITH GFR
ALT: 9 U/L (ref 0–35)
AST: 13 U/L (ref 0–37)
Albumin: 4.2 g/dL (ref 3.5–5.2)
Alkaline Phosphatase: 106 U/L (ref 39–117)
BUN: 27 mg/dL — ABNORMAL HIGH (ref 6–23)
CO2: 29 meq/L (ref 19–32)
Calcium: 9.2 mg/dL (ref 8.4–10.5)
Chloride: 102 meq/L (ref 96–112)
Creatinine, Ser: 1.34 mg/dL — ABNORMAL HIGH (ref 0.40–1.20)
GFR: 35.55 mL/min — ABNORMAL LOW (ref >60.00)
Glucose, Bld: 101 mg/dL — ABNORMAL HIGH (ref 70–99)
Potassium: 4.1 meq/L (ref 3.5–5.1)
Sodium: 137 meq/L (ref 135–145)
Total Bilirubin: 1 mg/dL (ref 0.2–1.2)
Total Protein: 7.5 g/dL (ref 6.0–8.3)

## 2024-06-28 LAB — LIPID PANEL
Cholesterol: 115 mg/dL (ref 0–200)
HDL: 36 mg/dL — ABNORMAL LOW (ref >39.00)
LDL Cholesterol: 67 mg/dL (ref 0–99)
NonHDL: 78.52
Total CHOL/HDL Ratio: 3
Triglycerides: 58 mg/dL (ref 0.0–149.0)
VLDL: 11.6 mg/dL (ref 0.0–40.0)

## 2024-06-28 LAB — HEMOGLOBIN A1C: Hgb A1c MFr Bld: 5.1 % (ref 4.6–6.5)

## 2024-06-28 LAB — VITAMIN D 25 HYDROXY (VIT D DEFICIENCY, FRACTURES): VITD: 69.47 ng/mL (ref 30.00–100.00)

## 2024-06-28 NOTE — Assessment & Plan Note (Addendum)
 Chronic Controlled, Stable Continue sertraline  50 mg daily, lorazepam  0.5 mg daily as needed-encouraged to keep this to a minimum

## 2024-06-28 NOTE — Assessment & Plan Note (Signed)
 Chronic Mild, stable Normocytic-likely anemia of chronic disease CBC

## 2024-06-28 NOTE — Assessment & Plan Note (Signed)
Chronic Regular exercise and healthy diet encouraged Check lipid panel  Continue Crestor 10 mg daily 

## 2024-06-28 NOTE — Assessment & Plan Note (Signed)
 Chronic Controlled Continue albuterol  inhaler as needed

## 2024-06-28 NOTE — Assessment & Plan Note (Signed)
 Chronic Follows with orthopedics Does not want surgery-uses a cane to ambulate Has had injections in the past Was taking NSAIDs at 1 point which may have contributed to CKD-no longer taking ibuprofen Taking Tylenol , Voltaren gel Tylenol  with codeine  300-30 mg 1 tablet every 6 hrs as needed-discussed increased risk of memory issues

## 2024-06-28 NOTE — Assessment & Plan Note (Signed)
 Chronic GFR has been fairly stable Avoid NSAIDs, increased water intake She has been taking the furosemide  every other day-swelling is tolerable CBC, CMP

## 2024-06-28 NOTE — Assessment & Plan Note (Signed)
 Chronic BMI 35.51 with hypertension, hyperlipidemia, diabetes, CKD 3B Continue Rybelsus  3 mg daily-sugars well controlled, but hoping this medication will help with continued weight loss Encouraged high-protein and vegetable diet low in carbs/sugars Encouraged decrease portions Encourage as much activity as tolerated with severe knee arthritis

## 2024-06-28 NOTE — Assessment & Plan Note (Signed)
 Chronic Associated with chronic kidney disease stage 3b, hypertension, hyperlipidemia Lab Results  Component Value Date   HGBA1C 5.1 06/28/2024   Sugars well-controlled Check A1c Continue Rybelsus  3 mg daily

## 2024-06-28 NOTE — Assessment & Plan Note (Signed)
 Chronic Blood pressure well controlled CMP, CBC Continue amlodipine  5 mg daily, losartan  100 mg daily

## 2024-07-02 ENCOUNTER — Other Ambulatory Visit: Payer: Self-pay | Admitting: Internal Medicine

## 2024-07-04 ENCOUNTER — Other Ambulatory Visit: Payer: Self-pay | Admitting: Internal Medicine

## 2024-07-04 DIAGNOSIS — M545 Low back pain, unspecified: Secondary | ICD-10-CM

## 2024-07-04 DIAGNOSIS — M79651 Pain in right thigh: Secondary | ICD-10-CM

## 2024-07-04 DIAGNOSIS — G8929 Other chronic pain: Secondary | ICD-10-CM

## 2024-07-20 MED ORDER — ACETAMINOPHEN-CODEINE 300-30 MG PO TABS: 1.0000 | ORAL_TABLET | Freq: Four times a day (QID) | ORAL | 0 refills | Status: AC | PRN

## 2024-07-20 NOTE — Addendum Note (Signed)
 Addended by: GEOFM GLADE PARAS on: 07/20/2024 07:54 AM   Modules accepted: Orders

## 2024-09-06 ENCOUNTER — Ambulatory Visit: Admitting: Podiatry
